# Patient Record
Sex: Male | Born: 1980
Health system: Southern US, Community
[De-identification: ages and names within clinical notes are randomized; demographics above are authoritative.]

## PROBLEM LIST (undated history)

## (undated) DIAGNOSIS — E785 Hyperlipidemia, unspecified: Secondary | ICD-10-CM

## (undated) DIAGNOSIS — I10 Essential (primary) hypertension: Secondary | ICD-10-CM

## (undated) DIAGNOSIS — T7840XA Allergy, unspecified, initial encounter: Secondary | ICD-10-CM

## (undated) DIAGNOSIS — G4730 Sleep apnea, unspecified: Secondary | ICD-10-CM

## (undated) DIAGNOSIS — R200 Anesthesia of skin: Secondary | ICD-10-CM

## (undated) DIAGNOSIS — K219 Gastro-esophageal reflux disease without esophagitis: Secondary | ICD-10-CM

## (undated) HISTORY — DX: Allergy, unspecified, initial encounter: T78.40XA

## (undated) HISTORY — DX: Morbid (severe) obesity due to excess calories: E66.01

## (undated) HISTORY — DX: Essential (primary) hypertension: I10

## (undated) HISTORY — DX: Gastro-esophageal reflux disease without esophagitis: K21.9

## (undated) HISTORY — DX: Anesthesia of skin: R20.0

## (undated) HISTORY — DX: Hyperlipidemia, unspecified: E78.5

---
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
## 2003-04-03 ENCOUNTER — Emergency Department (HOSPITAL_COMMUNITY): Admission: EM | Admit: 2003-04-03 | Discharge: 2003-04-03 | Payer: Self-pay | Admitting: Emergency Medicine

## 2005-10-03 ENCOUNTER — Encounter: Admission: RE | Admit: 2005-10-03 | Discharge: 2005-10-03 | Payer: Self-pay | Admitting: Occupational Medicine

## 2008-06-26 ENCOUNTER — Emergency Department (HOSPITAL_COMMUNITY): Admission: EM | Admit: 2008-06-26 | Discharge: 2008-06-26 | Payer: Self-pay | Admitting: Emergency Medicine

## 2008-07-01 ENCOUNTER — Encounter: Admission: RE | Admit: 2008-07-01 | Discharge: 2008-07-01 | Payer: Self-pay | Admitting: Family Medicine

## 2008-09-21 ENCOUNTER — Encounter: Admission: RE | Admit: 2008-09-21 | Discharge: 2008-09-21 | Payer: Self-pay | Admitting: Family Medicine

## 2008-12-15 ENCOUNTER — Encounter: Admission: RE | Admit: 2008-12-15 | Discharge: 2009-03-15 | Payer: Self-pay | Admitting: Family Medicine

## 2010-08-23 LAB — BASIC METABOLIC PANEL
BUN: 14 mg/dL (ref 6–23)
Calcium: 9.8 mg/dL (ref 8.4–10.5)
Chloride: 99 mEq/L (ref 96–112)
GFR calc Af Amer: >60 mL/min (ref >60)
Potassium: 4.7 mEq/L (ref 3.5–5.1)

## 2010-08-23 LAB — CBC
RBC: 6.24 MIL/uL — ABNORMAL HIGH (ref 4.22–5.81)
WBC: 6.4 10*3/uL (ref 4.0–10.5)

## 2010-08-23 LAB — URINE MICROSCOPIC-ADD ON

## 2010-08-23 LAB — URINALYSIS, ROUTINE W REFLEX MICROSCOPIC
Bilirubin Urine: NEGATIVE
Glucose, UA: >1000 mg/dL — AB
Leukocytes, UA: NEGATIVE
Protein, ur: 30 mg/dL — AB
Urobilinogen, UA: 0.2 mg/dL (ref 0.0–1.0)

## 2010-08-23 LAB — DIFFERENTIAL
Basophils Absolute: 0 10*3/uL (ref 0.0–0.1)
Lymphocytes Relative: 25 % (ref 12–46)
Monocytes Relative: 5 % (ref 3–12)
Neutro Abs: 4.3 10*3/uL (ref 1.7–7.7)

## 2010-08-23 LAB — GLUCOSE, CAPILLARY

## 2011-04-26 ENCOUNTER — Ambulatory Visit (INDEPENDENT_AMBULATORY_CARE_PROVIDER_SITE_OTHER): Payer: BC Managed Care – PPO | Admitting: Internal Medicine

## 2011-04-26 ENCOUNTER — Other Ambulatory Visit: Payer: Self-pay | Admitting: Internal Medicine

## 2011-04-26 ENCOUNTER — Encounter: Payer: Self-pay | Admitting: Internal Medicine

## 2011-04-26 ENCOUNTER — Other Ambulatory Visit (INDEPENDENT_AMBULATORY_CARE_PROVIDER_SITE_OTHER): Payer: BC Managed Care – PPO

## 2011-04-26 VITALS — BP 140/98 | HR 76 | Temp 98.2°F | Resp 16 | Ht 76.0 in | Wt 314.8 lb

## 2011-04-26 DIAGNOSIS — Z0000 Encounter for general adult medical examination without abnormal findings: Secondary | ICD-10-CM

## 2011-04-26 DIAGNOSIS — E782 Mixed hyperlipidemia: Secondary | ICD-10-CM

## 2011-04-26 DIAGNOSIS — IMO0001 Reserved for inherently not codable concepts without codable children: Secondary | ICD-10-CM

## 2011-04-26 DIAGNOSIS — I10 Essential (primary) hypertension: Secondary | ICD-10-CM

## 2011-04-26 DIAGNOSIS — N529 Male erectile dysfunction, unspecified: Secondary | ICD-10-CM

## 2011-04-26 DIAGNOSIS — I739 Peripheral vascular disease, unspecified: Secondary | ICD-10-CM

## 2011-04-26 DIAGNOSIS — K219 Gastro-esophageal reflux disease without esophagitis: Secondary | ICD-10-CM | POA: Insufficient documentation

## 2011-04-26 DIAGNOSIS — E781 Pure hyperglyceridemia: Secondary | ICD-10-CM | POA: Insufficient documentation

## 2011-04-26 LAB — CBC WITH DIFFERENTIAL/PLATELET
Basophils Relative: 0.4 % (ref 0.0–3.0)
Eosinophils Relative: 1.2 % (ref 0.0–5.0)
Lymphocytes Relative: 23.2 % (ref 12.0–46.0)
MCHC: 33.7 g/dL (ref 30.0–36.0)
Neutro Abs: 5.2 10*3/uL (ref 1.4–7.7)
Neutrophils Relative %: 71.2 % (ref 43.0–77.0)
Platelets: 233 10*3/uL (ref 150.0–400.0)
RBC: 6.3 Mil/uL — ABNORMAL HIGH (ref 4.22–5.81)
RDW: 13.9 % (ref 11.5–14.6)
WBC: 7.3 10*3/uL (ref 4.5–10.5)

## 2011-04-26 LAB — URINALYSIS, ROUTINE W REFLEX MICROSCOPIC
Ketones, ur: NEGATIVE
Leukocytes, UA: NEGATIVE
Nitrite: NEGATIVE
Total Protein, Urine: NEGATIVE
Urine Glucose: >=1000

## 2011-04-26 LAB — COMPREHENSIVE METABOLIC PANEL
Alkaline Phosphatase: 53 U/L (ref 39–117)
BUN: 14 mg/dL (ref 6–23)
CO2: 29 mEq/L (ref 19–32)
Calcium: 9.6 mg/dL (ref 8.4–10.5)
Creatinine, Ser: 1.2 mg/dL (ref 0.4–1.5)
GFR: 91.82 mL/min (ref >60.00)
Potassium: 4.3 mEq/L (ref 3.5–5.1)
Sodium: 138 mEq/L (ref 135–145)
Total Bilirubin: 0.7 mg/dL (ref 0.3–1.2)
Total Protein: 8 g/dL (ref 6.0–8.3)

## 2011-04-26 LAB — HM DIABETES FOOT EXAM

## 2011-04-26 LAB — LIPID PANEL: VLDL: 45.6 mg/dL — ABNORMAL HIGH (ref 0.0–40.0)

## 2011-04-26 MED ORDER — INSULIN LISPRO 100 UNIT/ML ~~LOC~~ SOLN: 25.0000 [IU] | Freq: Three times a day (TID) | SUBCUTANEOUS | Status: DC

## 2011-04-26 MED ORDER — INSULIN GLARGINE 100 UNIT/ML ~~LOC~~ SOLN: 60.0000 [IU] | Freq: Every day | SUBCUTANEOUS | Status: DC

## 2011-04-26 MED ORDER — RABEPRAZOLE SODIUM 20 MG PO TBEC: 20.0000 mg | DELAYED_RELEASE_TABLET | Freq: Every day | ORAL | Status: DC

## 2011-04-26 MED ORDER — OLMESARTAN-AMLODIPINE-HCTZ 40-5-12.5 MG PO TABS: 1.0000 | ORAL_TABLET | Freq: Every day | ORAL | Status: DC

## 2011-04-26 MED ORDER — SAXAGLIPTIN-METFORMIN ER 2.5-1000 MG PO TB24: 1.0000 | ORAL_TABLET | Freq: Every day | ORAL | Status: DC

## 2011-04-26 MED ORDER — VARDENAFIL HCL 10 MG PO TBDP: 1.0000 | ORAL_TABLET | ORAL | Status: DC | PRN

## 2011-04-26 NOTE — Assessment & Plan Note (Signed)
I have asked him to get ABI's done of his legs

## 2011-04-26 NOTE — Assessment & Plan Note (Signed)
His BP is not well controlled so I increased his meds to tribenzor

## 2011-04-26 NOTE — Assessment & Plan Note (Signed)
I will check his FLP today, he is doing well on crestor

## 2011-04-26 NOTE — Assessment & Plan Note (Signed)
Exam done, labs ordered, vaccines updated, pt ed material was given 

## 2011-04-26 NOTE — Assessment & Plan Note (Signed)
I will check labs to look for secondary causes and have given him staxyn to take

## 2011-04-26 NOTE — Progress Notes (Signed)
Subjective:    Patient ID: Mark Oconnor, male    DOB: 01-Jan-1981, 30 y.o.   MRN: [id number]  Diabetes He presents for his follow-up diabetic visit. He has type 2 diabetes mellitus. No MedicAlert identification noted. The initial diagnosis of diabetes was made 2 years ago. His disease course has been worsening. There are no hypoglycemic associated symptoms. Pertinent negatives for hypoglycemia include no dizziness, headaches, pallor, seizures, speech difficulty or tremors. Associated symptoms include polydipsia, polyphagia and polyuria. Pertinent negatives for diabetes include no blurred vision, no chest pain, no fatigue, no foot paresthesias, no foot ulcerations, no visual change, no weakness and no weight loss. There are no hypoglycemic complications. Symptoms are worsening. Diabetic complications include impotence and PVD. Current diabetic treatment includes intensive insulin program and oral agent (monotherapy). He is compliant with treatment all of the time. His weight is increasing steadily. He is following a generally healthy diet. Meal planning includes avoidance of concentrated sweets. He has not had a previous visit with a dietician. He never participates in exercise. His home blood glucose trend is increasing steadily. His breakfast blood glucose range is generally 130-140 mg/dl. His lunch blood glucose range is generally 180-200 mg/dl. His dinner blood glucose range is generally >200 mg/dl. His highest blood glucose is >200 mg/dl. His overall blood glucose range is >200 mg/dl. An ACE inhibitor/angiotensin II receptor blocker is being taken. He does not see a podiatrist.Eye exam is current.      Review of Systems  Constitutional: Positive for unexpected weight change (weight gain). Negative for fever, chills, weight loss, diaphoresis, activity change, appetite change and fatigue.  HENT: Negative for sore throat, trouble swallowing and voice change.   Eyes: Negative.  Negative for blurred  vision.  Respiratory: Negative for apnea, cough, choking, chest tightness, shortness of breath, wheezing and stridor.   Cardiovascular: Negative for chest pain, palpitations and leg swelling.  Gastrointestinal: Negative for nausea, vomiting, abdominal pain, diarrhea, blood in stool, abdominal distention and anal bleeding.  Genitourinary: Positive for polyuria, frequency and impotence. Negative for dysuria, urgency, hematuria, flank pain, decreased urine volume, discharge, penile swelling, scrotal swelling, enuresis, difficulty urinating, genital sores, penile pain and testicular pain.  Musculoskeletal: Positive for myalgias (cramps in feet and calves). Negative for back pain, joint swelling, arthralgias and gait problem.  Skin: Negative for color change, pallor, rash and wound.  Neurological: Negative for dizziness, tremors, seizures, syncope, facial asymmetry, speech difficulty, weakness, light-headedness, numbness and headaches.  Hematological: Positive for polydipsia and polyphagia. Negative for adenopathy. Does not bruise/bleed easily.  Psychiatric/Behavioral: Negative.        Objective:   Physical Exam  Vitals reviewed. Constitutional: He is oriented to person, place, and time. He appears well-developed and well-nourished. No distress.  HENT:  Head: Normocephalic and atraumatic.  Mouth/Throat: Oropharynx is clear and moist. No oropharyngeal exudate.  Eyes: Conjunctivae are normal. Right eye exhibits no discharge. Left eye exhibits no discharge. No scleral icterus.  Neck: Normal range of motion. Neck supple. No JVD present. No tracheal deviation present. No thyromegaly present.  Cardiovascular: Normal rate, regular rhythm, normal heart sounds and intact distal pulses.  Exam reveals no gallop and no friction rub.   No murmur heard. Pulmonary/Chest: Effort normal and breath sounds normal. No stridor. No respiratory distress. He has no wheezes. He has no rales. He exhibits no tenderness.    Abdominal: Soft. Bowel sounds are normal. He exhibits no distension and no mass. There is no tenderness. There is no rebound and no guarding.  Hernia confirmed negative in the right inguinal area and confirmed negative in the left inguinal area.  Genitourinary: Rectum normal, prostate normal, testes normal and penis normal. Rectal exam shows no external hemorrhoid, no internal hemorrhoid, no fissure, no mass and no tenderness. Guaiac negative stool. Prostate is not enlarged and not tender. Right testis shows no mass, no swelling and no tenderness. Right testis is descended. Left testis shows no mass, no swelling and no tenderness. Left testis is descended. Circumcised. No penile tenderness. No discharge found.  Musculoskeletal: Normal range of motion. He exhibits no edema and no tenderness.  Lymphadenopathy:    He has no cervical adenopathy.       Right: No inguinal adenopathy present.       Left: No inguinal adenopathy present.  Neurological: He is alert and oriented to person, place, and time. He has normal reflexes. He displays normal reflexes. No cranial nerve deficit. He exhibits normal muscle tone. Coordination normal.  Skin: Skin is warm and dry. No rash noted. He is not diaphoretic. No erythema. No pallor.  Psychiatric: He has a normal mood and affect. His behavior is normal. Judgment and thought content normal.      Lab Results  Component Value Date   WBC 6.4 06/26/2008   HGB 16.5 06/26/2008   HCT 47.3 06/26/2008   PLT 204 06/26/2008   GLUCOSE 392* 06/26/2008   NA 133* 06/26/2008   K 4.7 HEMOLYZED SPECIMEN, RESULTS MAY BE AFFECTED 06/26/2008   CL 99 06/26/2008   CREATININE 1.41 06/26/2008   BUN 14 06/26/2008   CO2 20 06/26/2008      Assessment & Plan:

## 2011-04-26 NOTE — Assessment & Plan Note (Signed)
Start aciphex

## 2011-04-26 NOTE — Patient Instructions (Signed)
Hypertension As your heart beats, it forces blood through your arteries. This force is your blood pressure. If the pressure is too high, it is called hypertension (HTN) or high blood pressure. HTN is dangerous because you may have it and not know it. High blood pressure may mean that your heart has to work harder to pump blood. Your arteries may be narrow or stiff. The extra work puts you at risk for heart disease, stroke, and other problems.  Blood pressure consists of two numbers, a higher number over a lower, 110/72, for example. It is stated as "110 over 72." The ideal is below 120 for the top number (systolic) and under 80 for the bottom (diastolic). Write down your blood pressure today. You should pay close attention to your blood pressure if you have certain conditions such as:  Heart failure.   Prior heart attack.   Diabetes   Chronic kidney disease.   Prior stroke.   Multiple risk factors for heart disease.  To see if you have HTN, your blood pressure should be measured while you are seated with your arm held at the level of the heart. It should be measured at least twice. A one-time elevated blood pressure reading (especially in the Emergency Department) does not mean that you need treatment. There may be conditions in which the blood pressure is different between your right and left arms. It is important to see your caregiver soon for a recheck. Most people have essential hypertension which means that there is not a specific cause. This type of high blood pressure may be lowered by changing lifestyle factors such as:  Stress.   Smoking.   Lack of exercise.   Excessive weight.   Drug/tobacco/alcohol use.   Eating less salt.  Most people do not have symptoms from high blood pressure until it has caused damage to the body. Effective treatment can often prevent, delay or reduce that damage. TREATMENT  When a cause has been identified, treatment for high blood pressure is  directed at the cause. There are a large number of medications to treat HTN. These fall into several categories, and your caregiver will help you select the medicines that are best for you. Medications may have side effects. You should review side effects with your caregiver. If your blood pressure stays high after you have made lifestyle changes or started on medicines,   Your medication(s) may need to be changed.   Other problems may need to be addressed.   Be certain you understand your prescriptions, and know how and when to take your medicine.   Be sure to follow up with your caregiver within the time frame advised (usually within two weeks) to have your blood pressure rechecked and to review your medications.   If you are taking more than one medicine to lower your blood pressure, make sure you know how and at what times they should be taken. Taking two medicines at the same time can result in blood pressure that is too low.  SEEK IMMEDIATE MEDICAL CARE IF:  You develop a severe headache, blurred or changing vision, or confusion.   You have unusual weakness or numbness, or a faint feeling.   You have severe chest or abdominal pain, vomiting, or breathing problems.  MAKE SURE YOU:   Understand these instructions.   Will watch your condition.   Will get help right away if you are not doing well or get worse.  Document Released: 04/24/2005 Document Revised: 01/04/2011 Document Reviewed:   12/13/2007 Exit[employer] Patient Information 2012 [employer].Diabetes, Type 2 Diabetes is a long-lasting (chronic) disease. In type 2 diabetes, the pancreas does not make enough insulin (a hormone), and the body does not respond normally to the insulin that is made. This type of diabetes was also previously called adult-onset diabetes. It usually occurs after the age of 40, but it can occur at any age.  CAUSES  Type 2 diabetes happens because the pancreasis not making enough insulin or your body has  trouble using the insulin that your pancreas does make properly. SYMPTOMS   Drinking more than usual.   Urinating more than usual.   Blurred vision.   Dry, itchy skin.   Frequent infections.   Feeling more tired than usual (fatigue).  DIAGNOSIS The diagnosis of type 2 diabetes is usually made by one of the following tests:  Fasting blood glucose test. You will not eat for at least 8 hours and then take a blood test.   Random blood glucose test. Your blood glucose (sugar) is checked at any time of the day regardless of when you ate.   Oral glucose tolerance test (OGTT). Your blood glucose is measured after you have not eaten (fasted) and then after you drink a glucose containing beverage.  TREATMENT   Healthy eating.   Exercise.   Medicine, if needed.   Monitoring blood glucose.   Seeing your caregiver regularly.  HOME CARE INSTRUCTIONS   Check your blood glucose at least once a day. More frequent monitoring may be necessary, depending on your medicines and on how well your diabetes is controlled. Your caregiver will advise you.   Take your medicine as directed by your caregiver.   Do not smoke.   Make wise food choices. Ask your caregiver for information. Weight loss can improve your diabetes.   Learn about low blood glucose (hypoglycemia) and how to treat it.   Get your eyes checked regularly.   Have a yearly physical exam. Have your blood pressure checked and your blood and urine tested.   Wear a pendant or bracelet saying that you have diabetes.   Check your feet every night for cuts, sores, blisters, and redness. Let your caregiver know if you have any problems.  SEEK MEDICAL CARE IF:   You have problems keeping your blood glucose in target range.   You have problems with your medicines.   You have symptoms of an illness that do not improve after 24 hours.   You have a sore or wound that is not healing.   You notice a change in vision or a new problem  with your vision.   You have a fever.  MAKE SURE YOU:  Understand these instructions.   Will watch your condition.   Will get help right away if you are not doing well or get worse.  Document Released: 04/24/2005 Document Revised: 01/05/2011 Document Reviewed: 10/10/2010 [employer] Patient Information 2012 [employer]. 

## 2011-04-26 NOTE — Assessment & Plan Note (Addendum)
His blood sugar is not well controlled, I have increase his meds to [id number] and have asked him to increase his insulin doses  as well, I will check his a1c and his c-peptide level today

## 2011-04-27 ENCOUNTER — Encounter: Payer: Self-pay | Admitting: Internal Medicine

## 2011-04-27 LAB — TESTOSTERONE, FREE, TOTAL, SHBG: Testosterone, Free: 85.2 pg/mL (ref 47.0–244.0)

## 2011-05-02 ENCOUNTER — Encounter (HOSPITAL_COMMUNITY): Payer: Self-pay | Admitting: *Deleted

## 2011-05-02 ENCOUNTER — Emergency Department (HOSPITAL_COMMUNITY)
Admission: EM | Admit: 2011-05-02 | Discharge: 2011-05-02 | Disposition: A | Discharge status: Home / Self Care | Payer: BC Managed Care – PPO | Attending: Emergency Medicine | Admitting: Emergency Medicine

## 2011-05-02 DIAGNOSIS — R6883 Chills (without fever): Secondary | ICD-10-CM | POA: Insufficient documentation

## 2011-05-02 DIAGNOSIS — R197 Diarrhea, unspecified: Secondary | ICD-10-CM | POA: Insufficient documentation

## 2011-05-02 DIAGNOSIS — R5381 Other malaise: Secondary | ICD-10-CM | POA: Insufficient documentation

## 2011-05-02 DIAGNOSIS — I10 Essential (primary) hypertension: Secondary | ICD-10-CM | POA: Insufficient documentation

## 2011-05-02 DIAGNOSIS — F17200 Nicotine dependence, unspecified, uncomplicated: Secondary | ICD-10-CM | POA: Insufficient documentation

## 2011-05-02 DIAGNOSIS — R5383 Other fatigue: Secondary | ICD-10-CM | POA: Insufficient documentation

## 2011-05-02 DIAGNOSIS — E119 Type 2 diabetes mellitus without complications: Secondary | ICD-10-CM | POA: Insufficient documentation

## 2011-05-02 DIAGNOSIS — Z794 Long term (current) use of insulin: Secondary | ICD-10-CM | POA: Insufficient documentation

## 2011-05-02 DIAGNOSIS — R1110 Vomiting, unspecified: Secondary | ICD-10-CM | POA: Insufficient documentation

## 2011-05-02 LAB — POCT I-STAT, CHEM 8
BUN: 19 mg/dL (ref 6–23)
Chloride: 105 mEq/L (ref 96–112)
Potassium: 3.8 mEq/L (ref 3.5–5.1)
Sodium: 140 mEq/L (ref 135–145)
TCO2: 24 mmol/L (ref 0–100)

## 2011-05-02 MED ORDER — LOPERAMIDE HCL 2 MG PO CAPS: 2.0000 mg | ORAL_CAPSULE | Freq: Four times a day (QID) | ORAL | Status: AC | PRN

## 2011-05-02 MED ORDER — DICYCLOMINE HCL 10 MG PO CAPS
20.0000 mg | ORAL_CAPSULE | ORAL | Status: DC
  Filled 2011-05-02: qty 2

## 2011-05-02 MED ORDER — DICYCLOMINE HCL 20 MG PO TABS
ORAL_TABLET | ORAL | Status: AC
  Administered 2011-05-02: 11:00:00
  Filled 2011-05-02: qty 1

## 2011-05-02 MED ORDER — METOCLOPRAMIDE HCL 5 MG/ML IJ SOLN
10.0000 mg | Freq: Once | INTRAMUSCULAR | Status: AC
  Administered 2011-05-02: 10 mg via INTRAVENOUS
  Filled 2011-05-02: qty 2

## 2011-05-02 MED ORDER — ONDANSETRON HCL 4 MG/2ML IJ SOLN
4.0000 mg | Freq: Once | INTRAMUSCULAR | Status: AC
  Administered 2011-05-02: 4 mg via INTRAVENOUS
  Filled 2011-05-02: qty 2

## 2011-05-02 MED ORDER — DICYCLOMINE HCL 20 MG PO TABS: 20.0000 mg | ORAL_TABLET | Freq: Two times a day (BID) | ORAL | Status: DC

## 2011-05-02 MED ORDER — METOCLOPRAMIDE HCL 10 MG PO TABS: 10.0000 mg | ORAL_TABLET | Freq: Four times a day (QID) | ORAL | Status: AC

## 2011-05-02 MED ORDER — SODIUM CHLORIDE 0.9 % IV BOLUS (SEPSIS)
2000.0000 mL | Freq: Once | INTRAVENOUS | Status: AC
  Administered 2011-05-02: 2000 mL via INTRAVENOUS

## 2011-05-02 NOTE — ED Notes (Signed)
Thurston Hole, NP at bedside in triage assessing pt.

## 2011-05-02 NOTE — ED Notes (Signed)
Pt reports vomiting/diarrhea x2 days. Able to tolerate liquids, has not eaten at home. Denies abd pain, blood in stool or emesis.

## 2011-05-02 NOTE — ED Provider Notes (Signed)
History     CSN: [id number]  Arrival date & time 05/02/11  4098   First MD Initiated Contact with Patient 05/02/11 [phone]      Chief Complaint  Patient presents with  . Emesis  . Diarrhea    (Consider location/radiation/quality/duration/timing/severity/associated sxs/prior treatment) Patient is a 30 y.o. male presenting with vomiting and diarrhea. The history is provided by the patient. No language interpreter was used.  Emesis  This is a new problem. The current episode started 2 days ago. The problem occurs 2 to 4 times per day. The problem has been gradually worsening. The emesis has an appearance of stomach contents. Associated symptoms include chills and diarrhea. Pertinent negatives include no abdominal pain, no arthralgias, no cough, no fever, no headaches, no myalgias and no URI.  Diarrhea The primary symptoms include fatigue, vomiting and diarrhea. Primary symptoms do not include fever, abdominal pain, myalgias or arthralgias.  The illness is also significant for chills.   Patient presents to the ER today with a two-day history of nausea and vomiting. He said he has had 5 episodes of diarrhea in the last 12 hours and 5 episodes of vomiting in the last 2 days. He has tried an acid reducer but got no relief. Denies upper respiratory symptoms, fever or body aches and pains. Patient is a smoker. Last blood sugar was 180 last night and he took his insulin after that. He has not had anything to eat or drink since.  Past Medical History  Diagnosis Date  . Diabetes mellitus   . Hyperlipidemia   . Hypertension     History reviewed. No pertinent past surgical history.  No family history on file.  History  Substance Use Topics  . Smoking status: Current Everyday Smoker -- 0.5 packs/day for 15 years    Types: Cigarettes  . Smokeless tobacco: Never Used  . Alcohol Use: No      Review of Systems  Constitutional: Positive for chills and fatigue. Negative for fever.  HENT:  Negative for ear pain, congestion, rhinorrhea, sneezing and postnasal drip.   Respiratory: Negative for apnea, cough and chest tightness.   Cardiovascular: Negative for chest pain.  Gastrointestinal: Positive for vomiting and diarrhea. Negative for abdominal pain.  Musculoskeletal: Negative for myalgias and arthralgias.  Neurological: Negative for dizziness, weakness, light-headedness and headaches.  Psychiatric/Behavioral: Negative.   All other systems reviewed and are negative.    Allergies  Review of patient's allergies indicates no known allergies.  Home Medications   Current Outpatient Rx  Name Route Sig Dispense Refill  . INSULIN GLARGINE 100 UNIT/ML [place] SOLN Subcutaneous Inject 60 Units into the skin at bedtime. 3 mL 11  . INSULIN LISPRO (HUMAN) 100 UNIT/ML [place] SOLN Subcutaneous Inject 25 Units into the skin 3 (three) times daily before meals. 3 mL 11  . METFORMIN HCL ER 750 MG PO TB24 Oral Take 750 mg by mouth 2 (two) times daily.      Marland Kitchen OLMESARTAN-AMLODIPINE-HCTZ 40-5-12.5 MG PO TABS Oral Take 1 tablet by mouth daily. 28 tablet 0  . RABEPRAZOLE SODIUM 20 MG PO TBEC Oral Take 1 tablet (20 mg total) by mouth daily. 15 tablet 0  . ROSUVASTATIN CALCIUM 20 MG PO TABS Oral Take 20 mg by mouth daily.      Marland Kitchen SAXAGLIPTIN-METFORMIN 2.09-998 MG PO TB24 Oral Take 1 tablet by mouth daily. 120 tablet 0  . VARDENAFIL HCL 10 MG PO TBDP Oral Take 1 tablet by mouth as needed. 16 tablet 0  BP 121/73  Pulse 92  Temp(Src) 99.9 F (37.7 C) (Oral)  Resp 14  SpO2 97%  Physical Exam  Nursing note and vitals reviewed. Constitutional: He is oriented to person, place, and time. He appears well-developed and well-nourished.  HENT:  Head: Normocephalic.  Eyes: Pupils are equal, round, and reactive to light.  Neck: Normal range of motion.  Cardiovascular: Normal rate.  Exam reveals no gallop and no friction rub.   No murmur heard. Pulmonary/Chest: Effort normal and breath sounds normal. No  respiratory distress. He has no wheezes. He has no rales. He exhibits no tenderness.  Abdominal: Soft. He exhibits no distension. There is no tenderness. There is no rebound and no guarding.  Musculoskeletal: Normal range of motion. He exhibits no edema and no tenderness.  Neurological: He is alert and oriented to person, place, and time.  Skin: Skin is warm and dry.  Psychiatric: He has a normal mood and affect.    ED Course  Procedures (including critical care time)   Labs Reviewed  I-STAT, CHEM 8   No results found.   No diagnosis found.    MDM  The patient feels better now after Zofran Reglan and 2 L of fluid in the ER today. He will leave with a prescription for Reglan and Imodium and Bentyl.  No further vomiting or diarrhea in the ER today patient will followup with Dr. Yetta Barre this week if not better.        Jethro Bastos, NP 05/04/11 1729

## 2011-05-03 ENCOUNTER — Ambulatory Visit: Payer: BC Managed Care – PPO | Admitting: Internal Medicine

## 2011-05-04 NOTE — ED Provider Notes (Signed)
Medical screening examination/treatment/procedure(s) were performed by non-physician practitioner and as supervising physician I was immediately available for consultation/collaboration.   Tlaloc Taddei L Latanya Hemmer, MD 05/04/11 2314 

## 2011-05-15 ENCOUNTER — Encounter: Payer: Self-pay | Admitting: Internal Medicine

## 2011-05-15 ENCOUNTER — Ambulatory Visit (INDEPENDENT_AMBULATORY_CARE_PROVIDER_SITE_OTHER): Payer: BC Managed Care – PPO | Admitting: Internal Medicine

## 2011-05-15 VITALS — BP 122/86 | HR 87 | Temp 97.8°F | Resp 16 | Wt 318.0 lb

## 2011-05-15 DIAGNOSIS — E782 Mixed hyperlipidemia: Secondary | ICD-10-CM

## 2011-05-15 DIAGNOSIS — IMO0001 Reserved for inherently not codable concepts without codable children: Secondary | ICD-10-CM

## 2011-05-15 DIAGNOSIS — I10 Essential (primary) hypertension: Secondary | ICD-10-CM

## 2011-05-15 NOTE — Assessment & Plan Note (Signed)
His BP is well controlled 

## 2011-05-15 NOTE — Assessment & Plan Note (Signed)
His blood sugars are improving on the current meds and lifestyle modifications, he will see diabetic education as well

## 2011-05-15 NOTE — Progress Notes (Signed)
Subjective:    Patient ID: Mark Oconnor, male    DOB: 26-Jul-1980, 31 y.o.   MRN: [id number]  Diabetes He presents for his follow-up diabetic visit. He has type 2 diabetes mellitus. His disease course has been improving. There are no hypoglycemic associated symptoms. Pertinent negatives for hypoglycemia include no pallor. Pertinent negatives for diabetes include no blurred vision, no chest pain, no fatigue, no foot paresthesias, no foot ulcerations, no polydipsia, no polyphagia, no polyuria, no visual change, no weakness and no weight loss. There are no hypoglycemic complications. Symptoms are stable. There are no diabetic complications. Current diabetic treatment includes oral agent (dual therapy) and intensive insulin program. He is compliant with treatment all of the time. His weight is stable. He is following a generally healthy diet. Meal planning includes avoidance of concentrated sweets. He participates in exercise intermittently. His home blood glucose trend is decreasing steadily. His breakfast blood glucose range is generally 110-130 mg/dl. His lunch blood glucose range is generally 130-140 mg/dl. His dinner blood glucose range is generally 130-140 mg/dl. His highest blood glucose is 180-200 mg/dl. His overall blood glucose range is 130-140 mg/dl. An ACE inhibitor/angiotensin II receptor blocker is being taken. He does not see a podiatrist.Eye exam is current.      Review of Systems  Constitutional: Negative for fever, chills, weight loss, diaphoresis, activity change, appetite change, fatigue and unexpected weight change.  HENT: Negative.   Eyes: Negative.  Negative for blurred vision.  Respiratory: Negative for cough, choking, shortness of breath, wheezing and stridor.   Cardiovascular: Negative for chest pain, palpitations and leg swelling.  Gastrointestinal: Negative for nausea, vomiting, abdominal pain, diarrhea and constipation.  Genitourinary: Negative for dysuria, urgency,  polyuria, frequency, hematuria, flank pain, decreased urine volume, enuresis and difficulty urinating.  Musculoskeletal: Negative for myalgias, back pain, joint swelling, arthralgias and gait problem.  Skin: Negative for color change, pallor, rash and wound.  Neurological: Negative.  Negative for weakness.  Hematological: Negative for polydipsia, polyphagia and adenopathy. Does not bruise/bleed easily.  Psychiatric/Behavioral: Negative.        Objective:   Physical Exam  Vitals reviewed. Constitutional: He is oriented to person, place, and time. He appears well-developed and well-nourished. No distress.  HENT:  Head: Normocephalic and atraumatic.  Mouth/Throat: Oropharynx is clear and moist. No oropharyngeal exudate.  Eyes: Conjunctivae are normal. Right eye exhibits no discharge. Left eye exhibits no discharge. No scleral icterus.  Neck: Normal range of motion. Neck supple. No JVD present. No tracheal deviation present. No thyromegaly present.  Cardiovascular: Normal rate, regular rhythm, normal heart sounds and intact distal pulses.   Pulses:      Carotid pulses are 1+ on the right side, and 1+ on the left side.      Radial pulses are 1+ on the right side, and 1+ on the left side.       Femoral pulses are 1+ on the right side, and 1+ on the left side.      Popliteal pulses are 1+ on the right side, and 1+ on the left side.       Dorsalis pedis pulses are 1+ on the right side, and 1+ on the left side.       Posterior tibial pulses are 1+ on the right side, and 1+ on the left side.  Pulmonary/Chest: Effort normal and breath sounds normal. No stridor. No respiratory distress. He has no wheezes. He has no rales. He exhibits no tenderness.  Abdominal: Soft. Bowel sounds are  normal. He exhibits no distension and no mass. There is no tenderness. There is no rebound and no guarding.  Musculoskeletal: Normal range of motion. He exhibits no edema and no tenderness.  Lymphadenopathy:    He has  no cervical adenopathy.  Neurological: He is oriented to person, place, and time.  Skin: Skin is warm and dry. No rash noted. He is not diaphoretic. No erythema. No pallor.  Psychiatric: He has a normal mood and affect. His behavior is normal. Judgment and thought content normal.      Lab Results  Component Value Date   WBC 7.3 04/26/2011   HGB 19.4* 05/02/2011   HCT 57.0* 05/02/2011   PLT 233.0 04/26/2011   GLUCOSE 190* 05/02/2011   CHOL 224* 04/26/2011   TRIG 228.0* 04/26/2011   HDL 41.80 04/26/2011   LDLDIRECT 140.2 04/26/2011   ALT 18 04/26/2011   AST 16 04/26/2011   NA 140 05/02/2011   K 3.8 05/02/2011   CL 105 05/02/2011   CREATININE 1.10 05/02/2011   BUN 19 05/02/2011   CO2 29 04/26/2011   TSH 0.48 04/26/2011   PSA 0.53 04/26/2011   HGBA1C 12.8* 04/26/2011      Assessment & Plan:

## 2011-05-15 NOTE — Patient Instructions (Signed)

## 2011-05-15 NOTE — Assessment & Plan Note (Signed)
He is doing well on crestor 

## 2011-05-22 ENCOUNTER — Encounter (INDEPENDENT_AMBULATORY_CARE_PROVIDER_SITE_OTHER): Payer: BC Managed Care – PPO | Admitting: *Deleted

## 2011-05-22 DIAGNOSIS — I739 Peripheral vascular disease, unspecified: Secondary | ICD-10-CM

## 2011-05-22 DIAGNOSIS — E1159 Type 2 diabetes mellitus with other circulatory complications: Secondary | ICD-10-CM

## 2011-07-09 ENCOUNTER — Emergency Department (INDEPENDENT_AMBULATORY_CARE_PROVIDER_SITE_OTHER)
Admission: EM | Admit: 2011-07-09 | Discharge: 2011-07-09 | Disposition: A | Discharge status: Home / Self Care | Payer: BC Managed Care – PPO | Source: Home / Self Care | Attending: Family Medicine | Admitting: Family Medicine

## 2011-07-09 ENCOUNTER — Encounter (HOSPITAL_COMMUNITY): Payer: Self-pay

## 2011-07-09 DIAGNOSIS — K130 Diseases of lips: Secondary | ICD-10-CM

## 2011-07-09 MED ORDER — IBUPROFEN 800 MG PO TABS
ORAL_TABLET | ORAL | Status: AC
  Filled 2011-07-09: qty 1

## 2011-07-09 MED ORDER — MUPIROCIN CALCIUM 2 % EX CREA: TOPICAL_CREAM | Freq: Three times a day (TID) | CUTANEOUS | Status: AC

## 2011-07-09 MED ORDER — IBUPROFEN 800 MG PO TABS
800.0000 mg | ORAL_TABLET | Freq: Once | ORAL | Status: AC
  Administered 2011-07-09: 800 mg via ORAL

## 2011-07-09 MED ORDER — DOXYCYCLINE HYCLATE 100 MG PO CAPS: 100.0000 mg | ORAL_CAPSULE | Freq: Two times a day (BID) | ORAL | Status: AC

## 2011-07-09 NOTE — Discharge Instructions (Signed)
Warm compress twice a day when you take the antibiotic, take all of medicine, return as needed. °

## 2011-07-09 NOTE — ED Notes (Signed)
Pt was seen at another facility on Friday and given amoxicillin for a boil on lt side of upper lip.  Swollen and painful and states he has had numerous abscesses in the past.

## 2011-07-09 NOTE — ED Provider Notes (Signed)
History     CSN: [id number]  Arrival date & time 07/09/11  0904   First MD Initiated Contact with Patient 07/09/11 [phone]      Chief Complaint  Patient presents with  . Recurrent Skin Infections    (Consider location/radiation/quality/duration/timing/severity/associated sxs/prior treatment) Patient is a 31 y.o. male presenting with abscess. The history is provided by the patient.  Abscess  This is a new problem. The current episode started less than one week ago. The problem has been gradually worsening. The abscess is present on the lips. The problem is moderate. The abscess is characterized by painfulness, draining and swelling. Past medical history comments: h/o mult abscesses on body.. Recently, medical care has been given at another facility.    Past Medical History  Diagnosis Date  . Diabetes mellitus   . Hyperlipidemia   . Hypertension     History reviewed. No pertinent past surgical history.  Family History  Problem Relation Age of Onset  . Diabetes Father   . Cancer Neg Hx   . Heart disease Neg Hx   . Hyperlipidemia Neg Hx   . Hypertension Neg Hx     History  Substance Use Topics  . Smoking status: Current Everyday Smoker -- 0.5 packs/day for 15 years    Types: Cigarettes  . Smokeless tobacco: Never Used  . Alcohol Use: Yes     occasional      Review of Systems  Constitutional: Negative.   Skin: Positive for rash.    Allergies  Review of patient's allergies indicates no known allergies.  Home Medications   Current Outpatient Rx  Name Route Sig Dispense Refill  . AMOXICILLIN 500 MG PO CAPS Oral Take 500 mg by mouth 3 (three) times daily.    Marland Kitchen DICYCLOMINE HCL 20 MG PO TABS Oral Take 1 tablet (20 mg total) by mouth 2 (two) times daily. 20 tablet 0  . DOXYCYCLINE HYCLATE 100 MG PO CAPS Oral Take 1 capsule (100 mg total) by mouth 2 (two) times daily. 20 capsule 0  . INSULIN GLARGINE 100 UNIT/ML [place] SOLN Subcutaneous Inject 60 Units into the skin at  bedtime. 3 mL 11  . INSULIN LISPRO (HUMAN) 100 UNIT/ML [place] SOLN Subcutaneous Inject 25 Units into the skin 3 (three) times daily before meals. 3 mL 11  . MUPIROCIN CALCIUM 2 % EX CREA Topical Apply topically 3 (three) times daily. 15 g 0  . OLMESARTAN-AMLODIPINE-HCTZ 40-5-12.5 MG PO TABS Oral Take 1 tablet by mouth daily. 28 tablet 0  . RABEPRAZOLE SODIUM 20 MG PO TBEC Oral Take 1 tablet (20 mg total) by mouth daily. 15 tablet 0  . ROSUVASTATIN CALCIUM 20 MG PO TABS Oral Take 20 mg by mouth daily.      Marland Kitchen SAXAGLIPTIN-METFORMIN ER 2.09-998 MG PO TB24 Oral Take 1 tablet by mouth daily. 120 tablet 0  . VARDENAFIL HCL 10 MG PO TBDP Oral Take 1 tablet by mouth as needed. 16 tablet 0    BP 130/76  Pulse 76  Temp(Src) 98.4 F (36.9 C) (Oral)  Resp 17  SpO2 98%  Physical Exam  Nursing note and vitals reviewed. Constitutional: He appears well-developed and well-nourished.  HENT:  Head: Normocephalic.  Right Ear: External ear normal.  Left Ear: External ear normal.  Mouth/Throat: Oropharynx is clear and moist.  Skin: Skin is warm and dry. Rash noted.       Crusting pustular sts to left upper lip.    ED Course  INCISION AND DRAINAGE Date/Time: 07/09/2011  9:31 AM Performed by: Barkley Bruns Authorized by: Barkley Bruns Consent: Verbal consent obtained. Consent given by: patient Type: abscess Body area: mouth (left side of upper lip externally.) Patient sedated: no Scalpel size: 15 Incision type: crsut debrided and purulent fluid expressed, copious. Complexity: simple Drainage: purulent Drainage amount: moderate Wound treatment: wound left open Patient tolerance: Patient tolerated the procedure well with no immediate complications.   (including critical care time)   Labs Reviewed  WOUND CULTURE   No results found.   1. Lip abscess       MDM          Barkley Bruns, MD 07/09/11 [phone]

## 2011-07-11 LAB — WOUND CULTURE

## 2011-07-12 ENCOUNTER — Telehealth (HOSPITAL_COMMUNITY): Payer: Self-pay | Admitting: *Deleted

## 2011-07-12 NOTE — ED Notes (Signed)
Wound culture L lip: Mod. MRSA. Pt. adequately treated with Doxycycline. I called and left message for pt. to call. Vassie Moselle 07/12/2011

## 2011-07-12 NOTE — ED Notes (Addendum)
Pt. called back and verified. Pt. given results and told he was adeq. treated with Doxycycline. Pt. denies hx. of MRSA. Instructed to finish all of antibiotic. MRSA instructions given. Pt. voiced understanding. Mark Oconnor 07/12/2011

## 2011-07-17 ENCOUNTER — Encounter: Payer: BC Managed Care – PPO | Admitting: *Deleted

## 2011-07-19 ENCOUNTER — Ambulatory Visit: Payer: BC Managed Care – PPO | Admitting: Internal Medicine

## 2011-07-26 ENCOUNTER — Encounter: Payer: Self-pay | Admitting: Internal Medicine

## 2011-07-26 ENCOUNTER — Ambulatory Visit (INDEPENDENT_AMBULATORY_CARE_PROVIDER_SITE_OTHER): Payer: BC Managed Care – PPO | Admitting: Internal Medicine

## 2011-07-26 ENCOUNTER — Other Ambulatory Visit (INDEPENDENT_AMBULATORY_CARE_PROVIDER_SITE_OTHER): Payer: BC Managed Care – PPO

## 2011-07-26 VITALS — BP 128/88 | HR 72 | Temp 98.3°F | Resp 16 | Wt 321.0 lb

## 2011-07-26 DIAGNOSIS — E782 Mixed hyperlipidemia: Secondary | ICD-10-CM

## 2011-07-26 DIAGNOSIS — IMO0001 Reserved for inherently not codable concepts without codable children: Secondary | ICD-10-CM

## 2011-07-26 DIAGNOSIS — L309 Dermatitis, unspecified: Secondary | ICD-10-CM

## 2011-07-26 DIAGNOSIS — L259 Unspecified contact dermatitis, unspecified cause: Secondary | ICD-10-CM

## 2011-07-26 DIAGNOSIS — I10 Essential (primary) hypertension: Secondary | ICD-10-CM

## 2011-07-26 LAB — LIPID PANEL
Cholesterol: 183 mg/dL (ref 0–200)
HDL: 43.4 mg/dL (ref >39.00)
LDL Cholesterol: 107 mg/dL — ABNORMAL HIGH (ref 0–99)
Triglycerides: 162 mg/dL — ABNORMAL HIGH (ref 0.0–149.0)
VLDL: 32.4 mg/dL (ref 0.0–40.0)

## 2011-07-26 LAB — URINALYSIS, ROUTINE W REFLEX MICROSCOPIC
Bilirubin Urine: NEGATIVE
Ketones, ur: NEGATIVE
Specific Gravity, Urine: 1.02 (ref 1.000–1.030)
Total Protein, Urine: NEGATIVE
Urine Glucose: >=1000
pH: 6 (ref 5.0–8.0)

## 2011-07-26 LAB — TSH: TSH: 0.57 u[IU]/mL (ref 0.35–5.50)

## 2011-07-26 LAB — CBC WITH DIFFERENTIAL/PLATELET
Basophils Relative: 1 % (ref 0.0–3.0)
Eosinophils Absolute: 0.1 10*3/uL (ref 0.0–0.7)
Eosinophils Relative: 1.4 % (ref 0.0–5.0)
Lymphocytes Relative: 30.7 % (ref 12.0–46.0)
MCHC: 32.8 g/dL (ref 30.0–36.0)
Neutrophils Relative %: 62.8 % (ref 43.0–77.0)
Platelets: 234 10*3/uL (ref 150.0–400.0)
RBC: 5.74 Mil/uL (ref 4.22–5.81)
WBC: 7.7 10*3/uL (ref 4.5–10.5)

## 2011-07-26 LAB — COMPREHENSIVE METABOLIC PANEL
ALT: 13 U/L (ref 0–53)
AST: 16 U/L (ref 0–37)
Albumin: 4 g/dL (ref 3.5–5.2)
Alkaline Phosphatase: 46 U/L (ref 39–117)
Glucose, Bld: 283 mg/dL — ABNORMAL HIGH (ref 70–99)
Potassium: 4.5 mEq/L (ref 3.5–5.1)
Sodium: 135 mEq/L (ref 135–145)
Total Bilirubin: 0.3 mg/dL (ref 0.3–1.2)
Total Protein: 7.6 g/dL (ref 6.0–8.3)

## 2011-07-26 LAB — HEMOGLOBIN A1C: Hgb A1c MFr Bld: 12.8 % — ABNORMAL HIGH (ref 4.6–6.5)

## 2011-07-26 MED ORDER — HYDROXYZINE HCL 10 MG PO TABS: 10.0000 mg | ORAL_TABLET | ORAL | Status: AC | PRN

## 2011-07-26 MED ORDER — METHYLPREDNISOLONE ACETATE 80 MG/ML IJ SUSP: 120.0000 mg | Freq: Once | INTRAMUSCULAR | Status: DC

## 2011-07-26 NOTE — Progress Notes (Signed)
Subjective:    Patient ID: Mark Oconnor, male    DOB: 10/08/1980, 31 y.o.   MRN: [id number]  Rash This is a chronic problem. The current episode started more than 1 year ago. The problem has been gradually worsening since onset. The affected locations include the torso, left arm and face. The rash is characterized by itchiness and dryness. He was exposed to nothing. Associated symptoms include facial edema. Pertinent negatives include no anorexia, congestion, cough, diarrhea, eye pain, fatigue, fever, joint pain, nail changes, rhinorrhea, shortness of breath, sore throat or vomiting. Past treatments include nothing. His past medical history is significant for eczema.  Diabetes He presents for his follow-up diabetic visit. He has type 2 diabetes mellitus. His disease course has been stable. Pertinent negatives for hypoglycemia include no pallor. Pertinent negatives for diabetes include no blurred vision, no chest pain, no fatigue, no foot paresthesias, no foot ulcerations, no polydipsia, no polyphagia, no polyuria, no visual change, no weakness and no weight loss. Symptoms are stable. There are no diabetic complications. Risk factors for coronary artery disease include no known risk factors. Current diabetic treatment includes oral agent (dual therapy) and intensive insulin program. He is compliant with treatment most of the time. He is following a generally unhealthy diet. When asked about meal planning, he reported none. He never participates in exercise. There is no change in his home blood glucose trend. An ACE inhibitor/angiotensin II receptor blocker is being taken. He does not see a podiatrist.Eye exam is current.      Review of Systems  Constitutional: Negative for fever, chills, weight loss, diaphoresis, activity change, appetite change, fatigue and unexpected weight change.  HENT: Positive for facial swelling. Negative for hearing loss, ear pain, nosebleeds, congestion, sore throat,  rhinorrhea, trouble swallowing, neck pain, neck stiffness, voice change, tinnitus and ear discharge.   Eyes: Negative for blurred vision and pain.  Respiratory: Negative for cough and shortness of breath.   Cardiovascular: Negative for chest pain, palpitations and leg swelling.  Gastrointestinal: Negative for nausea, vomiting, abdominal pain, diarrhea, constipation, blood in stool, abdominal distention, anal bleeding, rectal pain and anorexia.  Genitourinary: Negative for polyuria.  Musculoskeletal: Negative for myalgias, back pain, joint pain, joint swelling, arthralgias and gait problem.  Skin: Positive for rash. Negative for nail changes, color change, pallor and wound.  Neurological: Negative for weakness.  Hematological: Negative for polydipsia, polyphagia and adenopathy. Does not bruise/bleed easily.  Psychiatric/Behavioral: Negative.        Objective:   Physical Exam  Vitals reviewed. Constitutional: He is oriented to person, place, and time. He appears well-developed and well-nourished. No distress.  HENT:  Head: Normocephalic and atraumatic.  Mouth/Throat: No oropharyngeal exudate.  Eyes: Conjunctivae are normal. Right eye exhibits no discharge. Left eye exhibits no discharge. No scleral icterus.  Neck: Normal range of motion. Neck supple. No JVD present. No tracheal deviation present. No thyromegaly present.  Cardiovascular: Normal rate, regular rhythm, normal heart sounds and intact distal pulses.  Exam reveals no gallop and no friction rub.   No murmur heard. Pulmonary/Chest: Effort normal and breath sounds normal. No stridor. No respiratory distress. He has no wheezes. He has no rales. He exhibits no tenderness.  Abdominal: Soft. Bowel sounds are normal. He exhibits no distension and no mass. There is no tenderness. There is no rebound and no guarding.  Musculoskeletal: Normal range of motion. He exhibits no edema and no tenderness.  Lymphadenopathy:    He has no cervical  adenopathy.  Neurological:  He is oriented to person, place, and time.  Skin: Skin is warm, dry and intact. Rash noted. No abrasion, no bruising, no ecchymosis, no laceration, no lesion, no petechiae and no purpura noted. Rash is macular. Rash is not papular, not maculopapular, not nodular, not pustular, not vesicular and not urticarial. He is not diaphoretic. No cyanosis or erythema. No pallor. Nails show no clubbing.          He has hyperpigmented macules over his left arm and left torso, the rash ends at the midline, over the left face there is mild erythema and swelling. There are no urticarial wheals.  Psychiatric: He has a normal mood and affect. His behavior is normal. Judgment and thought content normal.      Lab Results  Component Value Date   WBC 7.3 04/26/2011   HGB 19.4* 05/02/2011   HCT 57.0* 05/02/2011   PLT 233.0 04/26/2011   GLUCOSE 190* 05/02/2011   CHOL 224* 04/26/2011   TRIG 228.0* 04/26/2011   HDL 41.80 04/26/2011   LDLDIRECT 140.2 04/26/2011   ALT 18 04/26/2011   AST 16 04/26/2011   NA 140 05/02/2011   K 3.8 05/02/2011   CL 105 05/02/2011   CREATININE 1.10 05/02/2011   BUN 19 05/02/2011   CO2 29 04/26/2011   TSH 0.48 04/26/2011   PSA 0.53 04/26/2011   HGBA1C 12.8* 04/26/2011      Assessment & Plan:

## 2011-07-26 NOTE — Patient Instructions (Signed)
Eczema Atopic dermatitis, or eczema, is an inherited type of sensitive skin. Often people with eczema have a family history of allergies, asthma, or hay fever. It causes a red itchy rash and dry scaly skin. The itchiness may occur before the skin rash and may be very intense. It is not contagious. Eczema is generally worse during the cooler winter months and often improves with the warmth of summer. Eczema usually starts showing signs in infancy. Some children outgrow eczema, but it may last through adulthood. Flare-ups may be caused by:  Eating something or contact with something you are sensitive or allergic to.   Stress.  DIAGNOSIS  The diagnosis of eczema is usually based upon symptoms and medical history. TREATMENT  Eczema cannot be cured, but symptoms usually can be controlled with treatment or avoidance of allergens (things to which you are sensitive or allergic to).  Controlling the itching and scratching.   Use over-the-counter antihistamines as directed for itching. It is especially useful at night when the itching tends to be worse.   Use over-the-counter steroid creams as directed for itching.   Scratching makes the rash and itching worse and may cause impetigo (a skin infection) if fingernails are contaminated (dirty).   Keeping the skin well moisturized with creams every day. This will seal in moisture and help prevent dryness. Lotions containing alcohol and water can dry the skin and are not recommended.   Limiting exposure to allergens.   Recognizing situations that cause stress.   Developing a plan to manage stress.  HOME CARE INSTRUCTIONS   Take prescription and over-the-counter medicines as directed by your caregiver.   Do not use anything on the skin without checking with your caregiver.   Keep baths or showers short (5 minutes) in warm (not hot) water. Use mild cleansers for bathing. You may add non-perfumed bath oil to the bath water. It is best to avoid soap and  bubble bath.   Immediately after a bath or shower, when the skin is still damp, apply a moisturizing ointment to the entire body. This ointment should be a petroleum ointment. This will seal in moisture and help prevent dryness. The thicker the ointment the better. These should be unscented.   Keep fingernails cut short and wash hands often. If your child has eczema, it may be necessary to put soft gloves or mittens on your child at night.   Dress in clothes made of cotton or cotton blends. Dress lightly, as heat increases itching.   Avoid foods that may cause flare-ups. Common foods include cow's milk, peanut butter, eggs and wheat.   Keep a child with eczema away from anyone with fever blisters. The virus that causes fever blisters (herpes simplex) can cause a serious skin infection in children with eczema.  SEEK MEDICAL CARE IF:   Itching interferes with sleep.   The rash gets worse or is not better within one week following treatment.   The rash looks infected (pus or soft yellow scabs).   You or your child has an oral temperature above 102 F (38.9 C).   Your baby is older than 3 months with a rectal temperature of 100.5 F (38.1 C) or higher for more than 1 day.   The rash flares up after contact with someone who has fever blisters.  SEEK IMMEDIATE MEDICAL CARE IF:   Your baby is older than 3 months with a rectal temperature of 102 F (38.9 C) or higher.   Your baby is older than   3 months or younger with a rectal temperature of 100.4 F (38 C) or higher.  Document Released: 04/21/2000 Document Revised: 04/13/2011 Document Reviewed: 02/24/2009 [employer] Patient Information 2012 [employer].Diabetes, Type 2 Diabetes is a long-lasting (chronic) disease. In type 2 diabetes, the pancreas does not make enough insulin (a hormone), and the body does not respond normally to the insulin that is made. This type of diabetes was also previously called adult-onset diabetes. It usually  occurs after the age of 40, but it can occur at any age.  CAUSES  Type 2 diabetes happens because the pancreasis not making enough insulin or your body has trouble using the insulin that your pancreas does make properly. SYMPTOMS   Drinking more than usual.   Urinating more than usual.   Blurred vision.   Dry, itchy skin.   Frequent infections.   Feeling more tired than usual (fatigue).  DIAGNOSIS The diagnosis of type 2 diabetes is usually made by one of the following tests:  Fasting blood glucose test. You will not eat for at least 8 hours and then take a blood test.   Random blood glucose test. Your blood glucose (sugar) is checked at any time of the day regardless of when you ate.   Oral glucose tolerance test (OGTT). Your blood glucose is measured after you have not eaten (fasted) and then after you drink a glucose containing beverage.  TREATMENT   Healthy eating.   Exercise.   Medicine, if needed.   Monitoring blood glucose.   Seeing your caregiver regularly.  HOME CARE INSTRUCTIONS   Check your blood glucose at least once a day. More frequent monitoring may be necessary, depending on your medicines and on how well your diabetes is controlled. Your caregiver will advise you.   Take your medicine as directed by your caregiver.   Do not smoke.   Make wise food choices. Ask your caregiver for information. Weight loss can improve your diabetes.   Learn about low blood glucose (hypoglycemia) and how to treat it.   Get your eyes checked regularly.   Have a yearly physical exam. Have your blood pressure checked and your blood and urine tested.   Wear a pendant or bracelet saying that you have diabetes.   Check your feet every night for cuts, sores, blisters, and redness. Let your caregiver know if you have any problems.  SEEK MEDICAL CARE IF:   You have problems keeping your blood glucose in target range.   You have problems with your medicines.   You have  symptoms of an illness that do not improve after 24 hours.   You have a sore or wound that is not healing.   You notice a change in vision or a new problem with your vision.   You have a fever.  MAKE SURE YOU:  Understand these instructions.   Will watch your condition.   Will get help right away if you are not doing well or get worse.  Document Released: 04/24/2005 Document Revised: 04/13/2011 Document Reviewed: 10/10/2010 [employer] Patient Information 2012 [employer]. 

## 2011-07-27 ENCOUNTER — Encounter: Payer: Self-pay | Admitting: Internal Medicine

## 2011-07-27 NOTE — Assessment & Plan Note (Signed)
His BP is well controlled, I will check his lytes and renal function today 

## 2011-07-27 NOTE — Assessment & Plan Note (Signed)
I will check his FLP today 

## 2011-07-27 NOTE — Assessment & Plan Note (Signed)
This rash he tells me has been present off/on for 5 years, he saw a dermatologist and was told that it is eczema, I have elected to treat it with depo-medrol IM and anti-histamines and have also asked him to see an allergist to investigate about what he is allergic to

## 2011-07-27 NOTE — Assessment & Plan Note (Addendum)
His blood sugar remains very high so I have asked him to see diabetic education and ENDO for further evaluation and treatment, I will check his renal function today

## 2011-08-25 ENCOUNTER — Ambulatory Visit (INDEPENDENT_AMBULATORY_CARE_PROVIDER_SITE_OTHER): Payer: BC Managed Care – PPO | Admitting: Internal Medicine

## 2011-08-25 ENCOUNTER — Encounter: Payer: Self-pay | Admitting: Internal Medicine

## 2011-08-25 ENCOUNTER — Ambulatory Visit: Payer: BC Managed Care – PPO | Admitting: Endocrinology

## 2011-08-25 ENCOUNTER — Ambulatory Visit (INDEPENDENT_AMBULATORY_CARE_PROVIDER_SITE_OTHER): Payer: BC Managed Care – PPO | Admitting: Endocrinology

## 2011-08-25 VITALS — BP 122/84 | HR 80 | Temp 98.5°F | Resp 16 | Wt 319.8 lb

## 2011-08-25 VITALS — BP 122/88 | HR 80 | Temp 98.2°F | Ht 76.0 in | Wt 319.0 lb

## 2011-08-25 DIAGNOSIS — IMO0001 Reserved for inherently not codable concepts without codable children: Secondary | ICD-10-CM

## 2011-08-25 DIAGNOSIS — E782 Mixed hyperlipidemia: Secondary | ICD-10-CM

## 2011-08-25 DIAGNOSIS — I10 Essential (primary) hypertension: Secondary | ICD-10-CM

## 2011-08-25 MED ORDER — INSULIN GLARGINE 100 UNIT/ML ~~LOC~~ SOLN: 60.0000 [IU] | Freq: Every day | SUBCUTANEOUS | Status: DC

## 2011-08-25 MED ORDER — INSULIN LISPRO 100 UNIT/ML ~~LOC~~ SOLN: SUBCUTANEOUS | Status: DC

## 2011-08-25 NOTE — Progress Notes (Signed)
Subjective:    Patient ID: Mark Oconnor, male    DOB: 11-16-1980, 31 y.o.   MRN: [id number]  Diabetes He presents for his follow-up diabetic visit. He has type 2 diabetes mellitus. His disease course has been stable. There are no hypoglycemic associated symptoms. Pertinent negatives for hypoglycemia include no dizziness, headaches, pallor, seizures, speech difficulty or tremors. Pertinent negatives for diabetes include no blurred vision, no chest pain, no fatigue, no foot paresthesias, no foot ulcerations, no polydipsia, no polyphagia, no polyuria, no visual change, no weakness and no weight loss. There are no hypoglycemic complications. There are no diabetic complications. Current diabetic treatment includes intensive insulin program and oral agent (dual therapy). He is compliant with treatment all of the time. His weight is stable. He is following a generally unhealthy diet. When asked about meal planning, he reported none. There is no change in his home blood glucose trend. An ACE inhibitor/angiotensin II receptor blocker is being taken. He does not see a podiatrist.Eye exam is current.      Review of Systems  Constitutional: Negative for fever, chills, weight loss, diaphoresis, activity change, appetite change, fatigue and unexpected weight change.  HENT: Negative.   Eyes: Negative.  Negative for blurred vision.  Respiratory: Negative for cough, chest tightness, shortness of breath, wheezing and stridor.   Cardiovascular: Negative for chest pain, palpitations and leg swelling.  Gastrointestinal: Negative for nausea, vomiting, abdominal pain, diarrhea, constipation and anal bleeding.  Genitourinary: Negative.  Negative for polyuria.  Musculoskeletal: Negative for myalgias, back pain, joint swelling, arthralgias and gait problem.  Skin: Negative for color change, pallor, rash and wound.  Neurological: Negative for dizziness, tremors, seizures, syncope, facial asymmetry, speech difficulty,  weakness, light-headedness, numbness and headaches.  Hematological: Negative for polydipsia, polyphagia and adenopathy. Does not bruise/bleed easily.  Psychiatric/Behavioral: Negative.        Objective:   Physical Exam  Vitals reviewed. Constitutional: He is oriented to person, place, and time. He appears well-developed and well-nourished. No distress.  HENT:  Head: Normocephalic and atraumatic.  Mouth/Throat: Oropharynx is clear and moist. No oropharyngeal exudate.  Eyes: Conjunctivae are normal. Right eye exhibits no discharge. Left eye exhibits no discharge. No scleral icterus.  Neck: Normal range of motion. Neck supple. No JVD present. No tracheal deviation present. No thyromegaly present.  Cardiovascular: Normal rate, regular rhythm, normal heart sounds and intact distal pulses.  Exam reveals no gallop and no friction rub.   No murmur heard. Pulmonary/Chest: Effort normal and breath sounds normal. No stridor. No respiratory distress. He has no wheezes. He has no rales. He exhibits no tenderness.  Abdominal: Soft. Bowel sounds are normal. He exhibits no distension and no mass. There is no tenderness. There is no rebound and no guarding.  Musculoskeletal: Normal range of motion. He exhibits no edema and no tenderness.  Lymphadenopathy:    He has no cervical adenopathy.  Neurological: He is oriented to person, place, and time.  Skin: Skin is warm and dry. No rash noted. He is not diaphoretic. No erythema. No pallor.  Psychiatric: He has a normal mood and affect. His behavior is normal. Judgment and thought content normal.     Lab Results  Component Value Date   WBC 7.7 07/26/2011   HGB 14.7 07/26/2011   HCT 44.7 07/26/2011   PLT 234.0 07/26/2011   GLUCOSE 283* 07/26/2011   CHOL 183 07/26/2011   TRIG 162.0* 07/26/2011   HDL 43.40 07/26/2011   LDLDIRECT 140.2 04/26/2011   LDLCALC 107*  07/26/2011   ALT 13 07/26/2011   AST 16 07/26/2011   NA 135 07/26/2011   K 4.5 07/26/2011   CL 98  07/26/2011   CREATININE 1.1 07/26/2011   BUN 13 07/26/2011   CO2 28 07/26/2011   TSH 0.57 07/26/2011   PSA 0.53 04/26/2011   HGBA1C 12.8* 07/26/2011      Assessment & Plan:

## 2011-08-25 NOTE — Progress Notes (Signed)
Subjective:    Patient ID: Mark Oconnor, male    DOB: 09-07-1980, 31 y.o.   MRN: [id number]  HPI pt states 2 years h/o dm.  he is unaware of any chronic complications.  he has been on insulin since dx.  pt says neither his diet nor exercise are good. Pt states few mos of intermittent moderate pain of the feet, but no assoc numbness.  Pt says he has hypoglycemia if he takes humalog, but does not eat.  Other than that, cbg's vary from 200-350.  It is in general higher as the day goes on. Past Medical History  Diagnosis Date  . Diabetes mellitus   . Hyperlipidemia   . Hypertension     No past surgical history on file.  History   Social History  . Marital Status: Single    Spouse Name: N/A    Number of Children: N/A  . Years of Education: N/A   Occupational History  . Not on file.   Social History Main Topics  . Smoking status: Current Everyday Smoker -- 0.5 packs/day for 15 years    Types: Cigarettes  . Smokeless tobacco: Never Used  . Alcohol Use: Yes     occasional  . Drug Use: No  . Sexually Active: Yes   Other Topics Concern  . Not on file   Social History Narrative  . No narrative on file    Current Outpatient Prescriptions on File Prior to Visit  Medication Sig Dispense Refill  . dicyclomine (BENTYL) 20 MG tablet Take 1 tablet (20 mg total) by mouth 2 (two) times daily.  20 tablet  0  . insulin glargine (LANTUS SOLOSTAR) 100 UNIT/ML injection Inject 60 Units into the skin at bedtime.  3 mL  11  . insulin lispro (HUMALOG PEN) 100 UNIT/ML injection Inject 25 Units into the skin 3 (three) times daily before meals.  3 mL  11  . Olmesartan-Amlodipine-HCTZ (TRIBENZOR) 40-5-12.5 MG TABS Take 1 tablet by mouth daily.  28 tablet  0  . RABEprazole (ACIPHEX) 20 MG tablet Take 1 tablet (20 mg total) by mouth daily.  15 tablet  0  . rosuvastatin (CRESTOR) 20 MG tablet Take 20 mg by mouth daily.        . Saxagliptin-Metformin (KOMBIGLYZE XR) 2.09-998 MG TB24 Take 1 tablet  by mouth daily.  120 tablet  0  . Vardenafil HCl 10 MG TBDP Take 1 tablet by mouth as needed.  16 tablet  0   Current Facility-Administered Medications on File Prior to Visit  Medication Dose Route Frequency Provider Last Rate Last Dose  . DISCONTD: methylPREDNISolone acetate (DEPO-MEDROL) injection 120 mg  120 mg Intramuscular Once Etta Grandchild, MD        No Known Allergies  Family History  Problem Relation Age of Onset  . Diabetes Father   . Cancer Neg Hx   . Heart disease Neg Hx   . Hyperlipidemia Neg Hx   . Hypertension Neg Hx     BP 122/88  Pulse 80  Temp(Src) 98.2 F (36.8 C) (Oral)  Ht 6\' 4"  (1.93 m)  Wt 319 lb (144.697 kg)  BMI 38.83 kg/m2  SpO2 97%  Review of Systems denies weight loss, blurry vision, headache, chest pain, sob, n/v, cramps, excessive diaphoresis, memory loss, depression, and easy bruising.  He has urinary frequency, rhinorrhea, and erectile dysfunction.    Objective:   Physical Exam VS: see vs page GEN: no distress HEAD: head: no deformity eyes: no  periorbital swelling, no proptosis external nose and ears are normal mouth: no lesion seen NECK: supple, thyroid is not enlarged CHEST WALL: no deformity LUNGS: clear to auscultation BREASTS:  pseudogynecomastia CV: reg rate and rhythm, no murmur ABD: abdomen is soft, nontender.  no hepatosplenomegaly.  not distended.  no hernia MUSCULOSKELETAL: muscle bulk and strength are grossly normal.  no obvious joint swelling.  gait is normal and steady EXTEMITIES: no deformity.  no ulcer on the feet.  feet are of normal color and temp.  no edema PULSES: dorsalis pedis intact bilat.  no carotid bruit NEURO:  cn 2-12 grossly intact.   readily moves all 4's.  sensation is intact to touch on the feet SKIN:  Normal texture and temperature.  No rash or suspicious lesion is visible.   NODES:  None palpable at the neck PSYCH: alert, oriented x3.  Does not appear anxious nor depressed.    Lab Results    Component Value Date   HGBA1C 12.8* 07/26/2011       Assessment & Plan:  DM, needs increased rx Foot pain, uncertain etiology.  Uncertain if neuropathic ED, prob due to DM Claudication, contributed to by dm

## 2011-08-25 NOTE — Patient Instructions (Addendum)
good diet and exercise habits significanly improve the control of your diabetes.  please let me know if you wish to be referred to a dietician.  high blood sugar is very risky to your health.  you should see an eye doctor every year. controlling your blood pressure and cholesterol drastically reduces the damage diabetes does to your body.  this also applies to quitting smoking.  please discuss these with your doctor.  you should take an aspirin every day, unless you have been advised by a doctor not to. check your blood sugar 2 times a day.  vary the time of day when you check, between before the 3 meals, and at bedtime.  also check if you have symptoms of your blood sugar being too high or too low.  please keep a record of the readings and bring it to your next appointment here.  please call us sooner if your blood sugar goes below 70, or if it stays over 200. You should take the humalog just before you eat, and only when you eat. Change humalog to 3x a day (just before each meal) 15-50-50 units. Same lantus.  Stop "kombliglyze." Please come back for a follow-up appointment in 2 weeks.

## 2011-08-27 ENCOUNTER — Encounter: Payer: Self-pay | Admitting: Internal Medicine

## 2011-08-27 MED ORDER — OLMESARTAN-AMLODIPINE-HCTZ 40-5-12.5 MG PO TABS: 1.0000 | ORAL_TABLET | Freq: Every day | ORAL | Status: DC

## 2011-08-27 NOTE — Assessment & Plan Note (Signed)
He is doing well on crestor 

## 2011-08-27 NOTE — Assessment & Plan Note (Signed)
His BP is well controlled 

## 2011-08-27 NOTE — Assessment & Plan Note (Signed)
He sees ENDO about this today

## 2011-09-07 ENCOUNTER — Institutional Professional Consult (permissible substitution): Payer: BC Managed Care – PPO | Admitting: Internal Medicine

## 2011-09-18 ENCOUNTER — Ambulatory Visit: Payer: BC Managed Care – PPO | Admitting: *Deleted

## 2011-09-26 ENCOUNTER — Ambulatory Visit (INDEPENDENT_AMBULATORY_CARE_PROVIDER_SITE_OTHER): Payer: BC Managed Care – PPO | Admitting: Internal Medicine

## 2011-09-26 ENCOUNTER — Encounter: Payer: Self-pay | Admitting: Internal Medicine

## 2011-09-26 ENCOUNTER — Other Ambulatory Visit: Payer: BC Managed Care – PPO

## 2011-09-26 VITALS — BP 124/88 | HR 76 | Ht 76.0 in | Wt 324.2 lb

## 2011-09-26 DIAGNOSIS — Z840 Family history of diseases of the skin and subcutaneous tissue: Secondary | ICD-10-CM

## 2011-09-26 DIAGNOSIS — R21 Rash and other nonspecific skin eruption: Secondary | ICD-10-CM

## 2011-09-26 DIAGNOSIS — L209 Atopic dermatitis, unspecified: Secondary | ICD-10-CM

## 2011-09-26 DIAGNOSIS — L2089 Other atopic dermatitis: Secondary | ICD-10-CM

## 2011-09-26 NOTE — Patient Instructions (Signed)
Order lab- Allergy Profile   Dx atopic dermatitis  Consider trying a daily blocking antihistamine like Claritin/ loratadine for a month to see if it prevents flares.

## 2011-09-26 NOTE — Progress Notes (Signed)
09/26/11- 31 yoM smoker, diabetic, referred courtesy of Dr Dr Sanda Linger for allergy evaluation because of itching. For several years as an adult, he has associated generalized itching with situations when he gets heated. He has a long-standing history of mild eczema but says this problem is different. It has been most pronounced in the past year and mostly involves his upper body without visible rash. He also begins to itch with hard laughter. Itching is relieved by cooling off.  He has done better since the cortisone injection one month ago. He estimates episodes happening about once a week and lasting a few hours to half a day before fading. He says his mother is similar but she blames her cholesterol medication. History of mild seasonal allergic rhinitis without asthma. Since elementary school he has had patches of hyperpigmented skin. A dermatologist years ago called "skin blotch". He has used Oil of [place] and [place] with uncertain benefit. Medical problems include hypertension, insulin-dependent diabetes, elevated cholesterol. He denies intolerance to latex, contrast dye or aspirin. Positive for hepatitis B. 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  Prior to Admission medications   Medication Sig Start Date End Date Taking? Authorizing Provider  insulin glargine (LANTUS SOLOSTAR) 100 UNIT/ML injection Inject 60 Units into the skin at bedtime. 08/25/11 08/24/12 Yes Romero Belling, MD  insulin lispro (HUMALOG) 100 UNIT/ML injection 3x a day (just before each meal) 15-50-50 units 08/25/11  Yes Romero Belling, MD  Olmesartan-Amlodipine-HCTZ (TRIBENZOR) 40-5-12.5 MG TABS Take 1 tablet by mouth daily. 08/27/11  Yes Etta Grandchild, MD  ONE TOUCH ULTRA TEST test strip 1 each by Other route.  05/23/11  Yes Historical Provider, MD  rosuvastatin (CRESTOR) 20 MG tablet Take 20 mg by mouth daily.     Yes Historical Provider, MD   Past  Medical History  Diagnosis Date  . Diabetes mellitus   . Hyperlipidemia   . Hypertension    History reviewed. No pertinent past surgical history. Family History  Problem Relation Age of Onset  . Diabetes Father   . Cancer Neg Hx   . Heart disease Neg Hx   . Hyperlipidemia Neg Hx   . Hypertension Neg Hx    History   Social History  . Marital Status: Single    Spouse Name: N/A    Number of Children: N/A  . Years of Education: N/A   Occupational History  . Not on file.   Social History Main Topics  . Smoking status: Current Everyday Smoker -- 0.5 packs/day for 15 years    Types: Cigarettes  . Smokeless tobacco: Never Used  . Alcohol Use: No     occasional  . Drug Use: No  . Sexually Active: Yes   Other Topics Concern  . Not on file   Social History Narrative  . No narrative on file   ROS-see HPI Constitutional:   No-   weight loss, night sweats, fevers, chills, fatigue, lassitude. HEENT:   No-  headaches, difficulty swallowing, tooth/dental problems, sore throat,       No-  sneezing, ear ache, nasal congestion, post nasal drip,  CV:  No-   chest pain, orthopnea, PND, swelling in lower extremities, anasarca, dizziness, palpitations Resp: No-   shortness of breath with exertion or at rest.              No-   productive cough,  No non-productive cough,  No-  coughing up of blood.              No-   change in color of mucus.  No- wheezing.   Skin: Per HPI GI:  No-   heartburn, indigestion, abdominal pain, nausea, vomiting, diarrhea,                 change in bowel habits, loss of appetite GU: No-   dysuria, change in color of urine, no urgency or frequency.  No- flank pain. MS:  No-   joint pain or swelling.  No- decreased range of motion.  No- back pain. Neuro-     nothing unusual Psych:  No- change in mood or affect. No depression or anxiety.  No memory loss.  OBJ- Physical Exam General- Alert, Oriented, Affect-appropriate, Distress- none acute, obese Skin-  "blotchy" areas of hyperpigmentation on his arms-not excoriated or raised. These become pinker if rubbed gently Lymphadenopathy- none Head- atraumatic            Eyes- Gross vision intact, PERRLA, conjunctivae and secretions clear            Ears- Hearing, canals-normal            Nose- Clear, no-Septal dev, mucus, polyps, erosion, perforation             Throat- Mallampati II , mucosa clear , drainage- none, tonsils- atrophic Neck- flexible , trachea midline, no stridor , thyroid nl, carotid no bruit Chest - symmetrical excursion , unlabored           Heart/CV- RRR , no murmur , no gallop  , no rub, nl s1 s2                           - JVD- none , edema- none, stasis changes- none, varices- none           Lung- clear to P&A, wheeze- none, cough- none , dullness-none, rub- none           Chest wall-  Abd- tender-no, distended-no, bowel sounds-present, HSM- no Br/ Gen/ Rectal- Not done, not indicated Extrem- cyanosis- none, clubbing, none, atrophy- none, strength- nl Neuro- grossly intact to observation

## 2011-09-27 LAB — ALLERGY FULL PROFILE
Allergen,Goose feathers, e70: <0.1 kU/L (ref <0.35)
Aspergillus fumigatus, IgG: 1.13 kU/L — ABNORMAL HIGH (ref <0.35)
Bermuda Grass: 0.37 kU/L — ABNORMAL HIGH (ref <0.35)
Candida Albicans: 0.85 kU/L — ABNORMAL HIGH (ref <0.35)
Curvularia lunata: 2.1 kU/L — ABNORMAL HIGH (ref <0.35)
D. farinae: 6.05 kU/L — ABNORMAL HIGH (ref <0.35)
Elm IgE: 1.04 kU/L — ABNORMAL HIGH (ref <0.35)
Fescue: 2.14 kU/L — ABNORMAL HIGH (ref <0.35)
G009 Red Top: 1.98 kU/L — ABNORMAL HIGH (ref <0.35)
IgE (Immunoglobulin E), Serum: 103.6 IU/mL (ref 0.0–180.0)
Lamb's Quarters: 0.17 kU/L (ref <0.35)
Oak: 1 kU/L — ABNORMAL HIGH (ref <0.35)
Plantain: 0.63 kU/L — ABNORMAL HIGH (ref <0.35)
Stemphylium Botryosum: 1.12 kU/L — ABNORMAL HIGH (ref <0.35)
Timothy Grass: 1.74 kU/L — ABNORMAL HIGH (ref <0.35)

## 2011-09-29 NOTE — Progress Notes (Signed)
Quick Note:  Pt aware of results. Will follow up with CY on Friday 11-03-11 at 3:30 pm. ______

## 2011-09-30 DIAGNOSIS — R21 Rash and other nonspecific skin eruption: Secondary | ICD-10-CM | POA: Insufficient documentation

## 2011-09-30 DIAGNOSIS — Z840 Family history of diseases of the skin and subcutaneous tissue: Secondary | ICD-10-CM | POA: Insufficient documentation

## 2011-09-30 NOTE — Assessment & Plan Note (Signed)
This does not seem to be a heat-induced urticaria, as he sees no visible skin changes. He is pretty clear that the key is heating and release comes from cooling. It may be a form of neurogenic pruritus. I'm not sure if it could have a relation to autonomic instability from his diabetes. I don't recognize connection to medication. Plan-we will address the allergy question. Allergy profile. Try daily antihistamine.

## 2011-09-30 NOTE — Assessment & Plan Note (Signed)
This does not look like an active inflammation or infection. Consider biopsy. I don't know if it has any relation to his complaint of pruritus.

## 2011-10-25 ENCOUNTER — Encounter: Payer: BC Managed Care – PPO | Attending: Internal Medicine | Admitting: *Deleted

## 2011-10-25 ENCOUNTER — Encounter: Payer: Self-pay | Admitting: *Deleted

## 2011-10-25 VITALS — Ht 76.0 in | Wt 319.4 lb

## 2011-10-25 DIAGNOSIS — Z713 Dietary counseling and surveillance: Secondary | ICD-10-CM | POA: Insufficient documentation

## 2011-10-25 DIAGNOSIS — IMO0001 Reserved for inherently not codable concepts without codable children: Secondary | ICD-10-CM

## 2011-10-25 DIAGNOSIS — E119 Type 2 diabetes mellitus without complications: Secondary | ICD-10-CM | POA: Insufficient documentation

## 2011-10-25 NOTE — Patient Instructions (Addendum)
Goals:  Follow Diabetes Meal Plan as instructed  Eat 3 meals and 2 snacks, every 3-5 hrs  Limit carbohydrate intake to 45-60 grams carbohydrate/meal  Limit carbohydrate intake to 30 grams carbohydrate/snack  Add lean protein foods to meals/snacks  Monitor glucose levels as instructed by your doctor  Aim for 10 mins of physical activity daily  Bring food record and glucose log to your next nutrition visit  Limit extra fats- choose more lean proteins and leaner methods of food preparation  Limit portions  Treat low blood sugars with 15 g carbohydrate

## 2011-10-25 NOTE — Progress Notes (Signed)
  Medical Nutrition Therapy:  Appt start time: 0626 end time:  1630.   Assessment:  Primary concerns today: diabetes.   MEDICATIONS: see list   DIETARY INTAKE:  Usual eating pattern includes 2-3 meals and 0 snacks per day.  Everyday foods include energy-dense fast food and snacks from vending machine.  Avoided foods include none.    24-hr recall:  B ( AM): skips 5 days/week: fast food like [employer] or [place] breakfast sandwich with unsweetened beverages  Snk ( AM): none  L ( PM): fast food or takeout with unsweetened beverages Snk ( PM): none D ( PM): eats at home 2-3 days: [place] or salmon, spaghetti or tacos; eat out other night at fast food with unsweet beverage Snk ( PM): none Beverages: diet soda  Usual physical activity: none currently  Estimated energy needs: 2200 calories 248 g carbohydrates 165 g protein 61 g fat  Progress Towards Goal(s):  In progress.   Nutritional Diagnosis:  NB-1.1 Undesirable food choices related to carbohydrate-containing foods.  As evidenced by HgA1C of 12.8%.    Intervention:  Nutrition counseling provided.  Patient is here today with uncontrolled IDDM2, obesity, HTN, and hyperlipidemia.  He's been diagnosed with DM for several years now and has previously received diabetes education, but has not been adhering to dietary advice.  He admits to making unhealthy food choices.  Reviewed physiology of DM and role of obesity on insulin resistance.  Reviewed progression of DM and why patient is now insulin dependent.  Patient admits to forgetting carb counting so we discussed carb counting and portion control.  Also discussed reading food labels, limiting dietary fats, importance of eating 3 meals a day, and increasing physical activity.  Addressed hypoglycemia and how to treat.  Handouts given during visit include: Carb Counting and Food Label handouts Meal Plan Card  Goals: Follow Diabetes Meal Plan as instructed Eat 3 meals and 2  snacks, every 3-5 hrs Limit carbohydrate intake to 45-60 grams carbohydrate/meal Limit carbohydrate intake to 30 grams carbohydrate/snack Add lean protein foods to meals/snacks Monitor glucose levels as instructed by your doctor Aim for 10 mins of physical activity daily Bring food record and glucose log to your next nutrition visit Limit extra fats- choose more lean proteins and leaner methods of food preparation Limit portions Treat low blood sugars with 15 g carbohydrate Use Calorie [place] when eating out to make smarter choices  Monitoring/Evaluation:  Dietary intake, exercise, GBM, and body weight in 1 month(s).  Review carb counting, as needed, discuss heart healthy eating, physical activity, and smoking cessation

## 2011-11-03 ENCOUNTER — Ambulatory Visit: Payer: BC Managed Care – PPO | Admitting: Internal Medicine

## 2011-11-27 ENCOUNTER — Encounter: Payer: BC Managed Care – PPO | Attending: Internal Medicine | Admitting: *Deleted

## 2011-11-27 VITALS — Ht 76.0 in | Wt 316.0 lb

## 2011-11-27 DIAGNOSIS — IMO0001 Reserved for inherently not codable concepts without codable children: Secondary | ICD-10-CM

## 2011-11-27 DIAGNOSIS — E119 Type 2 diabetes mellitus without complications: Secondary | ICD-10-CM | POA: Insufficient documentation

## 2011-11-27 DIAGNOSIS — Z713 Dietary counseling and surveillance: Secondary | ICD-10-CM | POA: Insufficient documentation

## 2011-11-27 NOTE — Progress Notes (Signed)
  Medical Nutrition Therapy:  Appt start time: 1630 end time:  1700.   Assessment:  Primary concerns today: diabetes follow up.   MEDICATIONS: see list   DIETARY INTAKE:  Usual eating pattern includes 2 meals and 0 snacks per day.  Everyday foods include starches vegetables, meats.  Avoided foods include none.    24-hr recall:  B ( AM): skips  Snk ( AM): candy when encounters hypoglycemia  L ( PM): brings lunch from home: salad, sandwich, carrots Snk ( PM): none D ( PM): no takeout!  Meat, starch, veg cooked at home Snk ( PM): none Beverages: unsweetened beverages, water  Usual physical activity: just bought elliptical machine in 10 minute intervals for 2 days now.   Estimated energy needs: 2000-2200 calories 225 g carbohydrates 150 g protein 56 g fat  Progress Towards Goal(s):  Some progress.  Mark Oconnor has lost 3 pounds since last visit!!  He is bringing his lunch from home and has cut out take-out food.  Lunch and dinner are balanced and nutritious.  He just started exercising too   Nutritional Diagnosis:  NB-1.1 Undesirable food choices related to carbohydrate-containing foods. As evidenced by obesity and HgA1C of 12.8%.     Intervention:  Nutrition counseling provided.  Mark Oconnor is still experiencing hypoglycemia in the mid-morning and is treating with candies.  He skips breakfast daily and takes his insulin, as prescribed.  Strongly encouraged him to have something for breakfast.  Discussed Glucerna or Boost glucose control shake as a breakfast alternative.  Encouraged him to check his blood sugar when he feels symptomatic to see if he indeed is below 60-70 mg/dl.  Encouraged the progress on physical activity and asked him to control exercises.  Also encouraged multivitamin supplements.  Monitoring/Evaluation:  Dietary intake, exercise, GBM, and body weight prn.  Will call after next HgA1C test.

## 2011-11-27 NOTE — Patient Instructions (Signed)
Eat breakfast or drink glucerna Check blood glucose when feel hypoglycemic Continue exercise

## 2011-12-15 ENCOUNTER — Encounter: Payer: Self-pay | Admitting: Internal Medicine

## 2011-12-15 ENCOUNTER — Ambulatory Visit (INDEPENDENT_AMBULATORY_CARE_PROVIDER_SITE_OTHER): Payer: BC Managed Care – PPO | Admitting: Internal Medicine

## 2011-12-15 VITALS — BP 140/102 | HR 83 | Ht 76.0 in | Wt 325.6 lb

## 2011-12-15 DIAGNOSIS — R21 Rash and other nonspecific skin eruption: Secondary | ICD-10-CM

## 2011-12-15 DIAGNOSIS — J3089 Other allergic rhinitis: Secondary | ICD-10-CM

## 2011-12-15 DIAGNOSIS — J302 Other seasonal allergic rhinitis: Secondary | ICD-10-CM

## 2011-12-15 DIAGNOSIS — J309 Allergic rhinitis, unspecified: Secondary | ICD-10-CM

## 2011-12-15 NOTE — Progress Notes (Signed)
09/26/11- 31 yoM smoker, diabetic, referred courtesy of Dr Dr Sanda Linger for allergy evaluation because of itching. For several years as an adult, he has associated generalized itching with situations when he gets heated. He has a long-standing history of mild eczema but says this problem is different. It has been most pronounced in the past year and mostly involves his upper body without visible rash. He also begins to itch with hard laughter. Itching is relieved by cooling off.  He has done better since the cortisone injection one month ago. He estimates episodes happening about once a week and lasting a few hours to half a day before fading. He says his mother is similar but she blames her cholesterol medication. History of mild seasonal allergic rhinitis without asthma. Since elementary school he has had patches of hyperpigmented skin. A dermatologist years ago called "skin blotch". He has used Oil of [place] and [place] with uncertain benefit. Medical problems include hypertension, insulin-dependent diabetes, elevated cholesterol. He denies intolerance to latex, contrast dye or aspirin. Positive for hepatitis B. 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  12/15/11- 31 yoM smoker, diabetic, referred courtesy of Dr Dr Sanda Linger for allergy evaluation because of itching.  c/o runny nose, itchy eyes, and red eyes. States had bronchitis x 2 weeks ago.  Allergy Profile- 09/26/2011-total IgE 103.6, specific elevations for almost everything on the panel. Daily Claritin blocks skin itching which is much better, still with no visible rash. Now has a cold/bronchitis starting 2 weeks ago and improved to residual dry cough. Some itching of eyes and nose. Watery rhinorrhea. Benadryl at bedtime helps sleep.  ROS-see HPI Constitutional:   No-   weight loss, night sweats, fevers, chills, fatigue, lassitude. HEENT:   No-  headaches,  difficulty swallowing, tooth/dental problems, sore throat,       +-  sneezing, ear ache, nasal congestion, +post nasal drip,  CV:  No-   chest pain, orthopnea, PND, swelling in lower extremities, anasarca, dizziness, palpitations Resp: No-   shortness of breath with exertion or at rest.              No-   productive cough, + non-productive cough,  No- coughing up of blood.              No-   change in color of mucus.  No- wheezing.   Skin: Per HPI GI:  No-   heartburn, indigestion, abdominal pain, nausea, vomiting,  GU: . MS:  No-   joint pain or swelling.  No- decreased range of motion.  No- back pain. Neuro-     nothing unusual Psych:  No- change in mood or affect. No depression or anxiety.  No memory loss.  OBJ- Physical Exam General- Alert, Oriented, Affect-appropriate, Distress- none acute, obese Skin- no visible rash  Lymphadenopathy- none Head- atraumatic            Eyes- Gross vision intact, PERRLA, +conjunctivae  mildly injected            Ears- Hearing, canals-normal            Nose- + mucus crusting, no-Septal dev,  polyps, erosion, perforation             Throat- Mallampati III-IV , mucosa clear , drainage- none, tonsils- atrophic Neck- flexible , trachea midline, no stridor , thyroid nl, carotid no bruit Chest - symmetrical excursion , unlabored  Heart/CV- RRR , no murmur , no gallop  , no rub, nl s1 s2                           - JVD- none , edema- none, stasis changes- none, varices- none           Lung- clear to P&A, wheeze- none, cough- none , dullness-none, rub- none           Chest wall-  Abd-  Br/ Gen/ Rectal- Not done, not indicated Extrem- cyanosis- none, clubbing, none, atrophy- none, strength- nl Neuro- grossly intact to observation

## 2011-12-15 NOTE — Patient Instructions (Addendum)
Continue claritin/ loratadine antihistamine daily for the itching. You can skip days if you want to see what it is doing.  Sample Omnaris steroid nasal spray    And   Sample Astepro antihistamine nasal spray Use 1-2 puffs of each in each nostril once daily at bedtime.  Please call as needed

## 2011-12-21 ENCOUNTER — Encounter: Payer: Self-pay | Admitting: Internal Medicine

## 2011-12-21 DIAGNOSIS — J302 Other seasonal allergic rhinitis: Secondary | ICD-10-CM | POA: Insufficient documentation

## 2011-12-21 NOTE — Assessment & Plan Note (Signed)
No visible rash now and pruritus is gone or controlled on antihistamines. He is satisfied with this.

## 2011-12-21 NOTE — Assessment & Plan Note (Signed)
Plan-sample nasal steroid and nasal antihistamine sprays to use in combination. Environmental precautions. On return we will consider whether to schedule allergy skin testing

## 2011-12-22 ENCOUNTER — Other Ambulatory Visit (INDEPENDENT_AMBULATORY_CARE_PROVIDER_SITE_OTHER): Payer: BC Managed Care – PPO

## 2011-12-22 ENCOUNTER — Ambulatory Visit (INDEPENDENT_AMBULATORY_CARE_PROVIDER_SITE_OTHER): Payer: BC Managed Care – PPO | Admitting: Internal Medicine

## 2011-12-22 ENCOUNTER — Ambulatory Visit (INDEPENDENT_AMBULATORY_CARE_PROVIDER_SITE_OTHER)
Admission: RE | Admit: 2011-12-22 | Discharge: 2011-12-22 | Disposition: A | Discharge status: Home / Self Care | Payer: BC Managed Care – PPO | Source: Ambulatory Visit | Attending: Internal Medicine | Admitting: Internal Medicine

## 2011-12-22 ENCOUNTER — Encounter: Payer: Self-pay | Admitting: Internal Medicine

## 2011-12-22 VITALS — BP 130/90 | HR 88 | Temp 98.8°F | Resp 16 | Wt 319.0 lb

## 2011-12-22 DIAGNOSIS — J45901 Unspecified asthma with (acute) exacerbation: Secondary | ICD-10-CM

## 2011-12-22 DIAGNOSIS — R05 Cough: Secondary | ICD-10-CM

## 2011-12-22 DIAGNOSIS — E782 Mixed hyperlipidemia: Secondary | ICD-10-CM

## 2011-12-22 DIAGNOSIS — I10 Essential (primary) hypertension: Secondary | ICD-10-CM

## 2011-12-22 DIAGNOSIS — J4530 Mild persistent asthma, uncomplicated: Secondary | ICD-10-CM | POA: Insufficient documentation

## 2011-12-22 DIAGNOSIS — IMO0001 Reserved for inherently not codable concepts without codable children: Secondary | ICD-10-CM

## 2011-12-22 DIAGNOSIS — R059 Cough, unspecified: Secondary | ICD-10-CM | POA: Insufficient documentation

## 2011-12-22 DIAGNOSIS — Z23 Encounter for immunization: Secondary | ICD-10-CM

## 2011-12-22 LAB — COMPREHENSIVE METABOLIC PANEL
ALT: 16 U/L (ref 0–53)
AST: 26 U/L (ref 0–37)
CO2: 23 mEq/L (ref 19–32)
Calcium: 9.2 mg/dL (ref 8.4–10.5)
Chloride: 101 mEq/L (ref 96–112)
Creatinine, Ser: 1 mg/dL (ref 0.4–1.5)
GFR: 106.81 mL/min (ref >60.00)
Potassium: 4.7 mEq/L (ref 3.5–5.1)
Sodium: 134 mEq/L — ABNORMAL LOW (ref 135–145)
Total Protein: 7.8 g/dL (ref 6.0–8.3)

## 2011-12-22 LAB — CBC WITH DIFFERENTIAL/PLATELET
Basophils Absolute: 0.1 10*3/uL (ref 0.0–0.1)
Eosinophils Absolute: 0.8 10*3/uL — ABNORMAL HIGH (ref 0.0–0.7)
Hemoglobin: 15.6 g/dL (ref 13.0–17.0)
Lymphocytes Relative: 15.2 % (ref 12.0–46.0)
Lymphs Abs: 1.6 10*3/uL (ref 0.7–4.0)
MCHC: 32 g/dL (ref 30.0–36.0)
Monocytes Relative: 5.1 % (ref 3.0–12.0)
Neutro Abs: 7.4 10*3/uL (ref 1.4–7.7)
Platelets: 179 10*3/uL (ref 150.0–400.0)
RDW: 13.4 % (ref 11.5–14.6)

## 2011-12-22 LAB — LIPID PANEL
Total CHOL/HDL Ratio: 5
Triglycerides: 219 mg/dL — ABNORMAL HIGH (ref 0.0–149.0)

## 2011-12-22 LAB — TSH: TSH: 0.84 u[IU]/mL (ref 0.35–5.50)

## 2011-12-22 MED ORDER — FLUTICASONE-SALMETEROL 250-50 MCG/DOSE IN AEPB
1.0000 | INHALATION_SPRAY | Freq: Two times a day (BID) | RESPIRATORY_TRACT | Status: DC
Start: 2011-12-22 — End: 2012-01-23

## 2011-12-22 MED ORDER — HYDROCODONE-HOMATROPINE 5-1.5 MG/5ML PO SYRP: 5.0000 mL | ORAL_SOLUTION | Freq: Three times a day (TID) | ORAL | Status: AC | PRN

## 2011-12-22 MED ORDER — METHYLPREDNISOLONE ACETATE 80 MG/ML IJ SUSP
120.0000 mg | Freq: Once | INTRAMUSCULAR | Status: AC
  Administered 2011-12-22: 120 mg via INTRAMUSCULAR

## 2011-12-22 MED ORDER — ALBUTEROL SULFATE HFA 108 (90 BASE) MCG/ACT IN AERS: 2.0000 | INHALATION_SPRAY | Freq: Four times a day (QID) | RESPIRATORY_TRACT | Status: DC | PRN

## 2011-12-22 NOTE — Assessment & Plan Note (Signed)
FLP today 

## 2011-12-22 NOTE — Patient Instructions (Signed)
Diabetes, Type 2 Diabetes is a long-lasting (chronic) disease. In type 2 diabetes, the pancreas does not make enough insulin (a hormone), and the body does not respond normally to the insulin that is made. This type of diabetes was also previously called adult-onset diabetes. It usually occurs after the age of 81, but it can occur at any age.  CAUSES  Type 2 diabetes happens because the pancreasis not making enough insulin or your body has trouble using the insulin that your pancreas does make properly. SYMPTOMS   Drinking more than usual.   Urinating more than usual.   Blurred vision.   Dry, itchy skin.   Frequent infections.   Feeling more tired than usual (fatigue).  DIAGNOSIS The diagnosis of type 2 diabetes is usually made by one of the following tests:  Fasting blood glucose test. You will not eat for at least 8 hours and then take a blood test.   Random blood glucose test. Your blood glucose (sugar) is checked at any time of the day regardless of when you ate.   Oral glucose tolerance test (OGTT). Your blood glucose is measured after you have not eaten (fasted) and then after you drink a glucose containing beverage.  TREATMENT   Healthy eating.   Exercise.   Medicine, if needed.   Monitoring blood glucose.   Seeing your caregiver regularly.  HOME CARE INSTRUCTIONS   Check your blood glucose at least once a day. More frequent monitoring may be necessary, depending on your medicines and on how well your diabetes is controlled. Your caregiver will advise you.   Take your medicine as directed by your caregiver.   Do not smoke.   Make wise food choices. Ask your caregiver for information. Weight loss can improve your diabetes.   Learn about low blood glucose (hypoglycemia) and how to treat it.   Get your eyes checked regularly.   Have a yearly physical exam. Have your blood pressure checked and your blood and urine tested.   Wear a pendant or bracelet saying  that you have diabetes.   Check your feet every night for cuts, sores, blisters, and redness. Let your caregiver know if you have any problems.  SEEK MEDICAL CARE IF:   You have problems keeping your blood glucose in target range.   You have problems with your medicines.   You have symptoms of an illness that do not improve after 24 hours.   You have a sore or wound that is not healing.   You notice a change in vision or a new problem with your vision.   You have a fever.  MAKE SURE YOU:  Understand these instructions.   Will watch your condition.   Will get help right away if you are not doing well or get worse.  Document Released: 04/24/2005 Document Revised: 04/13/2011 Document Reviewed: 10/10/2010 [facility] Patient Information 2012 [place], [place].Asthma, Adult Asthma is caused by narrowing of the air passages in the lungs. It may be triggered by pollen, dust, animal dander, molds, some foods, respiratory infections, exposure to smoke, exercise, emotional stress or other allergens (things that cause allergic reactions or allergies). Repeat attacks are common. HOME CARE INSTRUCTIONS   Use prescription medications as ordered by your caregiver.   Avoid pollen, dust, animal dander, molds, smoke and other things that cause attacks at home and at work.   You may have fewer attacks if you decrease dust in your home. Electrostatic air cleaners may help.   It may help  to replace your pillows or mattress with materials less likely to cause allergies.   Talk to your caregiver about an action plan for managing asthma attacks at home, including, the use of a peak flow meter which measures the severity of your asthma attack. An action plan can help minimize or stop the attack without having to seek medical care.   If you are not on a fluid restriction, drink 8 to 10 glasses of water each day.   Always have a plan prepared for seeking medical attention, including, calling your physician,  accessing local emergency care, and calling 911 (in the [place] for a severe attack.   Discuss possible exercise routines with your caregiver.   If animal dander is the cause of asthma, you may need to get rid of pets.  SEEK MEDICAL CARE IF:   You have wheezing and shortness of breath even if taking medicine to prevent attacks.   You have muscle aches, chest pain or thickening of sputum.   Your sputum changes from clear or white to yellow, green, gray, or bloody.   You have any problems that may be related to the medicine you are taking (such as a rash, itching, swelling or trouble breathing).  SEEK IMMEDIATE MEDICAL CARE IF:   Your usual medicines do not stop your wheezing or there is increased coughing and/or shortness of breath.   You have increased difficulty breathing.   You have a fever.  MAKE SURE YOU:   Understand these instructions.   Will watch your condition.   Will get help right away if you are not doing well or get worse.  Document Released: 04/24/2005 Document Revised: 04/13/2011 Document Reviewed: 12/11/2007 [facility] Patient Information 2012 [place], [place].

## 2011-12-22 NOTE — Assessment & Plan Note (Signed)
I will check his A1c and will monitor his renal function 

## 2011-12-22 NOTE — Assessment & Plan Note (Signed)
I will check his CXR to see if he has pna, mass, edema 

## 2011-12-22 NOTE — Assessment & Plan Note (Signed)
He was treated with an injection of depo-medrol im and will start advair diskus and albuterol inhaler

## 2011-12-22 NOTE — Assessment & Plan Note (Signed)
His BP is well controlled, I will check his lytes and renal function 

## 2011-12-22 NOTE — Progress Notes (Signed)
Subjective:    Patient ID: Mark Oconnor, male    DOB: 27-Mar-1981, 31 y.o.   MRN: [id number]  Cough This is a chronic problem. The current episode started 1 to 4 weeks ago. The problem has been gradually worsening. The problem occurs every few hours. The cough is non-productive. Associated symptoms include rhinorrhea, shortness of breath and wheezing. Pertinent negatives include no chest pain, chills, ear congestion, ear pain, fever, headaches, heartburn, hemoptysis, myalgias, nasal congestion, postnasal drip, rash, sore throat, sweats or weight loss. Nothing aggravates the symptoms. He has tried nothing for the symptoms.  Diabetes He presents for his follow-up diabetic visit. He has type 2 diabetes mellitus. His disease course has been stable. There are no hypoglycemic associated symptoms. Pertinent negatives for hypoglycemia include no dizziness, headaches, pallor, seizures, speech difficulty, sweats or tremors. Associated symptoms include polydipsia, polyphagia and polyuria. Pertinent negatives for diabetes include no blurred vision, no chest pain, no fatigue, no foot paresthesias, no foot ulcerations, no visual change, no weakness and no weight loss. There are no hypoglycemic complications. There are no diabetic complications. Current diabetic treatment includes intensive insulin program, insulin injections and oral agent (monotherapy). He is compliant with treatment most of the time. His weight is stable. He is following a generally unhealthy diet. Meal planning includes avoidance of concentrated sweets. He has not had a previous visit with a dietician. He never participates in exercise. There is no change in his home blood glucose trend. His breakfast blood glucose range is generally 180-200 mg/dl. His lunch blood glucose range is generally >200 mg/dl. His dinner blood glucose range is generally >200 mg/dl. His highest blood glucose is >200 mg/dl. His overall blood glucose range is >200 mg/dl. An ACE  inhibitor/angiotensin II receptor blocker is being taken. He does not see a podiatrist.Eye exam is current.      Review of Systems  Constitutional: Negative for fever, chills, weight loss, diaphoresis, activity change, appetite change, fatigue and unexpected weight change.  HENT: Positive for rhinorrhea. Negative for ear pain, nosebleeds, congestion, sore throat, sneezing, drooling, mouth sores, trouble swallowing, dental problem, voice change, postnasal drip, sinus pressure and tinnitus.   Eyes: Negative.  Negative for blurred vision.  Respiratory: Positive for cough, shortness of breath and wheezing. Negative for apnea, hemoptysis, choking, chest tightness and stridor.   Cardiovascular: Negative for chest pain, palpitations and leg swelling.  Gastrointestinal: Negative for heartburn, nausea, vomiting, abdominal pain, diarrhea, constipation, blood in stool, abdominal distention, anal bleeding and rectal pain.  Genitourinary: Positive for polyuria.  Musculoskeletal: Negative for myalgias, back pain, joint swelling, arthralgias and gait problem.  Skin: Negative for color change, pallor, rash and wound.  Neurological: Negative for dizziness, tremors, seizures, syncope, facial asymmetry, speech difficulty, weakness, light-headedness, numbness and headaches.  Hematological: Positive for polydipsia and polyphagia. Negative for adenopathy. Does not bruise/bleed easily.  Psychiatric/Behavioral: Negative.        Objective:   Physical Exam  Vitals reviewed. Constitutional: He is oriented to person, place, and time. He appears well-developed and well-nourished.  Non-toxic appearance. He does not have a sickly appearance. He does not appear ill. No distress.  HENT:  Head: Normocephalic and atraumatic.  Mouth/Throat: Oropharynx is clear and moist. No oropharyngeal exudate.  Eyes: Conjunctivae are normal. Right eye exhibits no discharge. Left eye exhibits no discharge. No scleral icterus.  Neck:  Normal range of motion. Neck supple. No JVD present. No tracheal deviation present. No thyromegaly present.  Cardiovascular: Normal rate, regular rhythm, normal heart sounds and  intact distal pulses.  Exam reveals no gallop and no friction rub.   No murmur heard. Pulmonary/Chest: No accessory muscle usage or stridor. Tachypnea noted. No respiratory distress. He has no decreased breath sounds. He has wheezes in the right upper field, the right middle field, the right lower field, the left upper field, the left middle field and the left lower field. He has no rhonchi. He has no rales. He exhibits no tenderness.       He received a jet neb treatment of albuterol and after wards his lungs are clear bilaterally  Abdominal: Soft. Bowel sounds are normal. He exhibits no distension and no mass. There is no tenderness. There is no rebound and no guarding.  Musculoskeletal: Normal range of motion. He exhibits no edema and no tenderness.  Lymphadenopathy:    He has no cervical adenopathy.  Neurological: He is oriented to person, place, and time.  Skin: Skin is warm and dry. No rash noted. He is not diaphoretic. No erythema. No pallor.  Psychiatric: He has a normal mood and affect. His behavior is normal. Judgment and thought content normal.      Lab Results  Component Value Date   WBC 7.7 07/26/2011   HGB 14.7 07/26/2011   HCT 44.7 07/26/2011   PLT 234.0 07/26/2011   GLUCOSE 283* 07/26/2011   CHOL 183 07/26/2011   TRIG 162.0* 07/26/2011   HDL 43.40 07/26/2011   LDLDIRECT 140.2 04/26/2011   LDLCALC 107* 07/26/2011   ALT 13 07/26/2011   AST 16 07/26/2011   NA 135 07/26/2011   K 4.5 07/26/2011   CL 98 07/26/2011   CREATININE 1.1 07/26/2011   BUN 13 07/26/2011   CO2 28 07/26/2011   TSH 0.57 07/26/2011   PSA 0.53 04/26/2011   HGBA1C 12.8* 07/26/2011      Assessment & Plan:

## 2011-12-25 ENCOUNTER — Encounter: Payer: Self-pay | Admitting: Internal Medicine

## 2011-12-25 LAB — LDL CHOLESTEROL, DIRECT: Direct LDL: 123.7 mg/dL

## 2012-01-23 ENCOUNTER — Ambulatory Visit (INDEPENDENT_AMBULATORY_CARE_PROVIDER_SITE_OTHER): Payer: BC Managed Care – PPO | Admitting: Endocrinology

## 2012-01-23 ENCOUNTER — Encounter: Payer: Self-pay | Admitting: Endocrinology

## 2012-01-23 VITALS — BP 128/80 | HR 92 | Temp 98.6°F | Ht 76.0 in | Wt 317.0 lb

## 2012-01-23 DIAGNOSIS — J45901 Unspecified asthma with (acute) exacerbation: Secondary | ICD-10-CM

## 2012-01-23 DIAGNOSIS — IMO0001 Reserved for inherently not codable concepts without codable children: Secondary | ICD-10-CM

## 2012-01-23 MED ORDER — INSULIN GLARGINE 100 UNIT/ML ~~LOC~~ SOLN: 175.0000 [IU] | Freq: Every day | SUBCUTANEOUS | Status: DC

## 2012-01-23 MED ORDER — PROMETHAZINE-CODEINE 6.25-10 MG/5ML PO SYRP: 5.0000 mL | ORAL_SOLUTION | ORAL | Status: AC | PRN

## 2012-01-23 MED ORDER — FLUTICASONE-SALMETEROL 250-50 MCG/DOSE IN AEPB: 1.0000 | INHALATION_SPRAY | Freq: Two times a day (BID) | RESPIRATORY_TRACT | Status: DC

## 2012-01-23 NOTE — Progress Notes (Signed)
  Subjective:    Patient ID: Mark Oconnor, male    DOB: March 22, 1981, 31 y.o.   MRN: [id number]  HPI Pt states 1 week of slight dry-quality cough in the chest, and assoc doe.  He ran out of his advair. no cbg record, but states cbg's are still high.  He says he takes insulin as rx'ed, except at lunch.  Past Medical History  Diagnosis Date  . Diabetes mellitus   . Hyperlipidemia   . Hypertension     No past surgical history on file.  History   Social History  . Marital Status: Single    Spouse Name: N/A    Number of Children: N/A  . Years of Education: N/A   Occupational History  . Not on file.   Social History Main Topics  . Smoking status: Former Smoker -- 0.5 packs/day for 15 years    Types: Cigarettes    Quit date: 12/07/2011  . Smokeless tobacco: Never Used  . Alcohol Use: No     occasional  . Drug Use: No  . Sexually Active: Yes   Other Topics Concern  . Not on file   Social History Narrative  . No narrative on file    Current Outpatient Prescriptions on File Prior to Visit  Medication Sig Dispense Refill  . albuterol (VENTOLIN HFA) 108 (90 BASE) MCG/ACT inhaler Inhale 2 puffs into the lungs every 6 (six) hours as needed for wheezing.  18 g  0  . Fluticasone-Salmeterol (ADVAIR DISKUS) 250-50 MCG/DOSE AEPB Inhale 1 puff into the lungs 2 (two) times daily.  180 each  0  . insulin glargine (LANTUS) 100 UNIT/ML injection Inject 175 Units into the skin daily.  20 pen  11  . Olmesartan-Amlodipine-HCTZ (TRIBENZOR) 40-5-12.5 MG TABS Take 1 tablet by mouth daily.  90 tablet  3  . ONE TOUCH ULTRA TEST test strip 1 each by Other route.       . rosuvastatin (CRESTOR) 20 MG tablet Take 20 mg by mouth daily.          No Known Allergies  Family History  Problem Relation Age of Onset  . Diabetes Father   . Cancer Neg Hx   . Heart disease Neg Hx   . Hyperlipidemia Neg Hx   . Hypertension Neg Hx    BP 128/80  Pulse 92  Temp 98.6 F (37 C) (Oral)  Ht 6\' 4"  (1.93  m)  Wt 317 lb (143.79 kg)  BMI 38.59 kg/m2  SpO2 97%  Review of Systems denies hypoglycemia and fever    Objective:   Physical Exam VITAL SIGNS:  See vs page GENERAL: no distress LUNGS:  Clear to auscultation   Lab Results  Component Value Date   HGBA1C 13.2* 12/22/2011      Assessment & Plan:  Acute bronchitis, new DM, poor control.  He may do better with a simpler regimen Asthma, therapy limited by noncompliance.  i'll do the best i can.

## 2012-01-23 NOTE — Patient Instructions (Addendum)
Please resume the advair.  i have sent a prescription to your pharmacy.  Also, here are some samples.   Here is a prescription for cough syrup. Increase the lantus to 175 units daily Stop the humalog. Please come back for a follow-up appointment for 1 month. check your blood sugar twice a day.  vary the time of day when you check, between before the 3 meals, and at bedtime.  also check if you have symptoms of your blood sugar being too high or too low.  please keep a record of the readings and bring it to your next appointment here.  please call us sooner if your blood sugar goes below 70, or if you have a lot of readings over 200.

## 2012-02-05 ENCOUNTER — Ambulatory Visit: Payer: BC Managed Care – PPO | Admitting: Internal Medicine

## 2012-02-11 ENCOUNTER — Ambulatory Visit (INDEPENDENT_AMBULATORY_CARE_PROVIDER_SITE_OTHER): Payer: BC Managed Care – PPO | Admitting: Physician Assistant

## 2012-02-11 VITALS — BP 146/90 | HR 60 | Temp 98.1°F | Resp 16 | Ht 75.0 in | Wt 320.0 lb

## 2012-02-11 DIAGNOSIS — M79609 Pain in unspecified limb: Secondary | ICD-10-CM

## 2012-02-11 DIAGNOSIS — M79629 Pain in unspecified upper arm: Secondary | ICD-10-CM

## 2012-02-11 DIAGNOSIS — IMO0002 Reserved for concepts with insufficient information to code with codable children: Secondary | ICD-10-CM

## 2012-02-11 DIAGNOSIS — L03112 Cellulitis of left axilla: Secondary | ICD-10-CM

## 2012-02-11 MED ORDER — DOXYCYCLINE HYCLATE 100 MG PO CAPS: 100.0000 mg | ORAL_CAPSULE | Freq: Two times a day (BID) | ORAL | Status: DC

## 2012-02-11 NOTE — Progress Notes (Signed)
Patient ID: Mark Oconnor MRN: [id number], DOB: 09/22/1980 31 y.o. Date of Encounter: 02/11/2012, 10:58 AM  Chief Complaint: Wound care   See previous note  HPI: 31 y.o. y/o male presents with bump in left axilla.  Symptoms started 3 days ago and have been worsening.  Does admit to tenderness to touch. No spontaneous drainage, warmth, fever, or chills. He does have a history of similar lesions, 3 of which that have needed to be I&D. Last culture showed MRSA.    Past Medical History  Diagnosis Date  . Diabetes mellitus   . Hyperlipidemia   . Hypertension      Home Meds: Prior to Admission medications   Medication Sig Start Date End Date Taking? Authorizing Provider  albuterol (VENTOLIN HFA) 108 (90 BASE) MCG/ACT inhaler Inhale 2 puffs into the lungs every 6 (six) hours as needed for wheezing. 12/22/11 12/21/12 Yes Etta Grandchild, MD  Fluticasone-Salmeterol (ADVAIR DISKUS) 250-50 MCG/DOSE AEPB Inhale 1 puff into the lungs 2 (two) times daily. 01/23/12  Yes Romero Belling, MD  insulin glargine (LANTUS) 100 UNIT/ML injection Inject 175 Units into the skin daily. 01/23/12 01/22/13 Yes Romero Belling, MD  Olmesartan-Amlodipine-HCTZ [facility]) 40-5-12.5 MG TABS Take 1 tablet by mouth daily. 08/27/11  Yes Etta Grandchild, MD  ONE TOUCH ULTRA TEST test strip 1 each by Other route.  05/23/11  Yes Historical Provider, MD  rosuvastatin (CRESTOR) 20 MG tablet Take 20 mg by mouth daily.     Yes Historical Provider, MD  doxycycline (VIBRAMYCIN) 100 MG capsule Take 1 capsule (100 mg total) by mouth 2 (two) times daily. 02/11/12   Nelva Nay, PA-C    Allergies: No Known Allergies  ROS: Constitutional: Afebrile, no chills Cardiovascular: negative for chest pain or palpitations Dermatological: Positive for wound. Positive for erythema and tenderness. No warmth. GI: No nausea or vomiting   EXAM: Physical Exam: Blood pressure 146/90, pulse 60, temperature 98.1 F (36.7 C), temperature source Oral,  resp. rate 16, height 6\' 3"  (1.905 m), weight 320 lb (145.151 kg), SpO2 98.00%., Body mass index is 40.00 kg/(m^2). General: Well developed, well nourished, in no acute distress. Nontoxic appearing. Head: Normocephalic, atraumatic, sclera non-icteric.  Neck: Supple. Lungs: Breathing is unlabored. Heart: Normal rate. Skin:  Warm and moist. Dressing and packing in place. Minimal induration and erythema. +tenderness to palpation. Central area of fluctuance with opening.   Neuro: Alert and oriented X 3. Moves all extremities spontaneously. Normal gait.  Psych:  Responds to questions appropriately with a normal affect.       PROCEDURE: VCO Local anesthesia with 2% lidocaine plain.  1 cm incision with #11 blade along lesion. Minimal purulence expressed along with sebaceous material.  Wound bed healthy Irrigated. Packed with 1/4 plain packing.  Dressing applied   A/P: 31 y.o. y/o male with axillary cellulitis/abscess  Wound care per above Start doxycycline. Pain well controlled Daily dressing changes Recheck 48 hours  Signed, Rhoderick Moody, PA-C 02/11/2012 10:58 AM

## 2012-02-13 ENCOUNTER — Ambulatory Visit (INDEPENDENT_AMBULATORY_CARE_PROVIDER_SITE_OTHER): Payer: BC Managed Care – PPO | Admitting: Physician Assistant

## 2012-02-13 VITALS — BP 114/67 | HR 67 | Temp 99.0°F | Resp 16 | Ht 75.0 in | Wt 315.0 lb

## 2012-02-13 DIAGNOSIS — M79629 Pain in unspecified upper arm: Secondary | ICD-10-CM

## 2012-02-13 DIAGNOSIS — M79609 Pain in unspecified limb: Secondary | ICD-10-CM

## 2012-02-13 DIAGNOSIS — L03119 Cellulitis of unspecified part of limb: Secondary | ICD-10-CM

## 2012-02-13 DIAGNOSIS — IMO0002 Reserved for concepts with insufficient information to code with codable children: Secondary | ICD-10-CM

## 2012-02-13 LAB — POCT CBC
Granulocyte percent: 62.9 %G (ref 37–80)
HCT, POC: 51.4 % (ref 43.5–53.7)
Hemoglobin: 16.2 g/dL (ref 14.1–18.1)
Lymph, poc: 2.9 (ref 0.6–3.4)
MCH, POC: 25.5 pg — AB (ref 27–31.2)
MCHC: 31.5 g/dL — AB (ref 31.8–35.4)
MCV: 80.8 fL (ref 80–97)
MID (cbc): 0.6 (ref 0–0.9)
MPV: 10.5 fL (ref 0–99.8)
POC Granulocyte: 5.9 (ref 2–6.9)
POC LYMPH PERCENT: 30.7 %L (ref 10–50)
POC MID %: 6.4 %M (ref 0–12)
Platelet Count, POC: 345 10*3/uL (ref 142–424)
RBC: 6.36 M/uL — AB (ref 4.69–6.13)
RDW, POC: 14.3 %
WBC: 9.4 10*3/uL (ref 4.6–10.2)

## 2012-02-13 MED ORDER — CEFTRIAXONE SODIUM 1 G IJ SOLR
1.0000 g | INTRAMUSCULAR | Status: DC
  Administered 2012-02-13: 1 g via INTRAMUSCULAR

## 2012-02-13 MED ORDER — HYDROCODONE-ACETAMINOPHEN 5-500 MG PO TABS: 1.0000 | ORAL_TABLET | Freq: Three times a day (TID) | ORAL | Status: DC | PRN

## 2012-02-13 NOTE — Progress Notes (Signed)
Patient ID: Mark Oconnor MRN: [id number], DOB: [birth year] 31 y.o. Date of Encounter: 02/13/2012, 7:28 PM  Chief Complaint: Wound care   See previous note  HPI: 31 y.o. y/o male presents for wound care s/p I&D on 02/11/12 Doing well No issues or complaints Afebrile/ no chills No nausea or vomiting Tolerating doxycycline. Complains of pain.  Daily dressing change Previous note reviewed  Past Medical History  Diagnosis Date  . Diabetes mellitus   . Hyperlipidemia   . Hypertension      Home Meds: Prior to Admission medications   Medication Sig Start Date End Date Taking? Authorizing Provider  albuterol (VENTOLIN HFA) 108 (90 BASE) MCG/ACT inhaler Inhale 2 puffs into the lungs every 6 (six) hours as needed for wheezing. 12/22/11 12/21/12 Yes Etta Grandchild, MD  doxycycline (VIBRAMYCIN) 100 MG capsule Take 1 capsule (100 mg total) by mouth 2 (two) times daily. 02/11/12  Yes Tamre Cass M Kura Bethards, PA-C  Fluticasone-Salmeterol (ADVAIR DISKUS) 250-50 MCG/DOSE AEPB Inhale 1 puff into the lungs 2 (two) times daily. 01/23/12  Yes Romero Belling, MD  insulin glargine (LANTUS) 100 UNIT/ML injection Inject 175 Units into the skin daily. 01/23/12 01/22/13 Yes Romero Belling, MD  Olmesartan-Amlodipine-HCTZ [facility]) 40-5-12.5 MG TABS Take 1 tablet by mouth daily. 08/27/11  Yes Etta Grandchild, MD  ONE TOUCH ULTRA TEST test strip 1 each by Other route.  05/23/11  Yes Historical Provider, MD  rosuvastatin (CRESTOR) 20 MG tablet Take 20 mg by mouth daily.     Yes Historical Provider, MD  HYDROcodone-acetaminophen (VICODIN) 5-500 MG per tablet Take 1 tablet by mouth every 8 (eight) hours as needed for pain. 02/13/12   Nelva Nay, PA-C    Allergies: No Known Allergies  ROS: Constitutional: Afebrile, no chills Cardiovascular: negative for chest pain or palpitations Dermatological: Positive for wound. Positive for warmth and slight induration. Admits to pain. GI: No nausea or vomiting   EXAM: Physical  Exam: Blood pressure 114/67, pulse 67, temperature 99 F (37.2 C), temperature source Oral, resp. rate 16, height 6\' 3"  (1.905 m), weight 315 lb (142.883 kg), SpO2 98.00%., Body mass index is 39.37 kg/(m^2). General: Well developed, well nourished, in no acute distress. Nontoxic appearing. Head: Normocephalic, atraumatic, sclera non-icteric.  Neck: Supple. Lungs: Breathing is unlabored. Heart: Normal rate. Skin:  Warm and moist. Dressing and packing in place. slight induration, erythema, or tenderness to palpation. Neuro: Alert and oriented X 3. Moves all extremities spontaneously. Normal gait.  Psych:  Responds to questions appropriately with a normal affect.       PROCEDURE: Dressing and packing removed. Minimal purulence expressed. Copious amounts of drainage on dressing and packing. Sebaceous material expressed.  Wound bed healthy Irrigated with 2% plain lidocaine 5 cc. Repacked with 1/4 plain packing. Dressing applied  LAB: Results for orders placed in visit on 02/13/12  POCT CBC      Component Value Range   WBC 9.4  4.6 - 10.2 K/uL   Lymph, poc 2.9  0.6 - 3.4   POC LYMPH PERCENT 30.7  10 - 50 %L   MID (cbc) 0.6  0 - 0.9   POC MID % 6.4  0 - 12 %M   POC Granulocyte 5.9  2 - 6.9   Granulocyte percent 62.9  37 - 80 %G   RBC 6.36 (*) 4.69 - 6.13 M/uL   Hemoglobin 16.2  14.1 - 18.1 g/dL   HCT, POC 40.9  81.1 - 53.7 %   MCV 80.8  80 -  97 fL   MCH, POC 25.5 (*) 27 - 31.2 pg   MCHC 31.5 (*) 31.8 - 35.4 g/dL   RDW, POC 40.9     Platelet Count, POC 345  142 - 424 K/uL   MPV 10.5  0 - 99.8 fL     A/P: 31 y.o. y/o male with axillary cellulitis/abscess as above s/p I&D on Wound care per above Continue doxycycline.  Rocephin 1 gram given today Pain well controlled Daily dressing changes Recheck 48 hours  Signed, Rhoderick Moody, PA-C 02/13/2012 7:28 PM

## 2012-02-14 LAB — WOUND CULTURE: Gram Stain: NONE SEEN

## 2012-02-15 ENCOUNTER — Ambulatory Visit (INDEPENDENT_AMBULATORY_CARE_PROVIDER_SITE_OTHER): Payer: BC Managed Care – PPO | Admitting: Physician Assistant

## 2012-02-15 VITALS — BP 116/77 | HR 82 | Temp 98.5°F | Resp 18 | Ht 74.0 in | Wt 315.2 lb

## 2012-02-15 DIAGNOSIS — L03119 Cellulitis of unspecified part of limb: Secondary | ICD-10-CM

## 2012-02-15 DIAGNOSIS — IMO0002 Reserved for concepts with insufficient information to code with codable children: Secondary | ICD-10-CM

## 2012-02-15 NOTE — Progress Notes (Signed)
Patient ID: WINNER COURTWRIGHT MRN: [id number], DOB: [birth year] 31 y.o. Date of Encounter: 02/15/2012, 2:01 PM  Chief Complaint: Wound care   See previous note  HPI: 31 y.o. y/o male presents for wound care s/p I&D on 02/11/12 Doing well No issues or complaints Packing fell out yesterday Afebrile/ no chills No nausea or vomiting Tolerating doxycycline. Pain resolved. Daily dressing change Previous note reviewed  Past Medical History  Diagnosis Date  . Diabetes mellitus   . Hyperlipidemia   . Hypertension      Home Meds: Prior to Admission medications   Medication Sig Start Date End Date Taking? Authorizing Provider  albuterol (VENTOLIN HFA) 108 (90 BASE) MCG/ACT inhaler Inhale 2 puffs into the lungs every 6 (six) hours as needed for wheezing. 12/22/11 12/21/12 Yes Etta Grandchild, MD  doxycycline (VIBRAMYCIN) 100 MG capsule Take 1 capsule (100 mg total) by mouth 2 (two) times daily. 02/11/12  Yes Demitria Hay M Tannie Koskela, PA-C  Fluticasone-Salmeterol (ADVAIR DISKUS) 250-50 MCG/DOSE AEPB Inhale 1 puff into the lungs 2 (two) times daily. 01/23/12  Yes Romero Belling, MD  HYDROcodone-acetaminophen (VICODIN) 5-500 MG per tablet Take 1 tablet by mouth every 8 (eight) hours as needed for pain. 02/13/12  Yes Saanvi Hakala M Nyron Mozer, PA-C  insulin glargine (LANTUS) 100 UNIT/ML injection Inject 175 Units into the skin daily. 01/23/12 01/22/13 Yes Romero Belling, MD  Olmesartan-Amlodipine-HCTZ [facility]) 40-5-12.5 MG TABS Take 1 tablet by mouth daily. 08/27/11  Yes Etta Grandchild, MD  ONE TOUCH ULTRA TEST test strip 1 each by Other route.  05/23/11  Yes Historical Provider, MD  rosuvastatin (CRESTOR) 20 MG tablet Take 20 mg by mouth daily.     Yes Historical Provider, MD    Allergies: No Known Allergies  ROS: Constitutional: Afebrile, no chills Cardiovascular: negative for chest pain or palpitations Dermatological: Positive for wound. Negative for erythema, pain, or warmth.  GI: No nausea or vomiting    EXAM: Physical Exam: Blood pressure 116/77, pulse 82, temperature 98.5 F (36.9 C), temperature source Oral, resp. rate 18, height 6\' 2"  (1.88 m), weight 315 lb 3.2 oz (142.974 kg), SpO2 98.00%., Body mass index is 40.47 kg/(m^2). General: Well developed, well nourished, in no acute distress. Nontoxic appearing. Head: Normocephalic, atraumatic, sclera non-icteric.  Neck: Supple. Lungs: Breathing is unlabored. Heart: Normal rate. Skin:  Warm and moist. Dressing and packing in place. Minimal induration. No erythema or tenderness to palpation. Neuro: Alert and oriented X 3. Moves all extremities spontaneously. Normal gait.  Psych:  Responds to questions appropriately with a normal affect.       PROCEDURE: Dressing removed. No packing in wound.  No purulence expressed. Sebaceous material debrided.  Wound bed healthy Irrigated with 2% plain lidocaine 5 cc. Repacked with 1/4 plain packing.  Dressing applied  LAB: Culture:   A/P: 31 y.o. y/o male with axillary cellulitis/abscess as above s/p I&D on Wound care per above Continue doxycycline. Pain well controlled Daily dressing changes Recheck 48 hours  Signed, Rhoderick Moody, PA-C 02/15/2012 2:01 PM

## 2012-02-16 ENCOUNTER — Ambulatory Visit: Payer: BC Managed Care – PPO | Admitting: Internal Medicine

## 2012-02-18 ENCOUNTER — Ambulatory Visit (INDEPENDENT_AMBULATORY_CARE_PROVIDER_SITE_OTHER): Payer: BC Managed Care – PPO | Admitting: Physician Assistant

## 2012-02-18 VITALS — BP 122/82 | HR 82 | Temp 98.1°F | Resp 16 | Wt 324.0 lb

## 2012-02-18 DIAGNOSIS — L03112 Cellulitis of left axilla: Secondary | ICD-10-CM

## 2012-02-18 DIAGNOSIS — IMO0002 Reserved for concepts with insufficient information to code with codable children: Secondary | ICD-10-CM

## 2012-02-18 NOTE — Progress Notes (Signed)
   [address] [place] [address]   Phone [phone]  Subjective:    Patient ID: Mark Oconnor, male    DOB: 1981/03/08, 31 y.o.   MRN: [id number]  HPI Pt presents to clinic for wound recheck.  He is doing well. Less pain.  Tolerating abx ok.  He has noticed no change in his sugar readings since this abscess occurred.  He did use deodorant this am. Packing has fallen out yesterday.    Review of Systems  Constitutional: Negative for fever and chills.  Gastrointestinal: Negative for nausea.       Objective:   Physical Exam  Vitals reviewed. Constitutional: He is oriented to person, place, and time. He appears well-developed and well-nourished.  HENT:  Head: Normocephalic and atraumatic.  Right Ear: External ear normal.  Left Ear: External ear normal.  Eyes: Conjunctivae normal are normal.  Pulmonary/Chest: Effort normal.  Neurological: He is alert and oriented to person, place, and time.  Skin: Skin is warm and dry.       Drsg and packing are removed when I go into the room.  Good granulation tissue filling wound.  No surrounding erythema.  Some induration inferior to wound.  No TTP.  No need for packing.  Psychiatric: He has a normal mood and affect. His behavior is normal. Judgment and thought content normal.       Assessment & Plan:   1. Cellulitis of axilla, left    No f/u needed.  Finish abx.  Continue to monitor glucose.

## 2012-03-03 ENCOUNTER — Other Ambulatory Visit: Payer: Self-pay | Admitting: Internal Medicine

## 2012-03-19 ENCOUNTER — Ambulatory Visit: Payer: BC Managed Care – PPO | Admitting: Internal Medicine

## 2012-03-22 ENCOUNTER — Encounter: Payer: Self-pay | Admitting: Endocrinology

## 2012-03-22 ENCOUNTER — Ambulatory Visit (INDEPENDENT_AMBULATORY_CARE_PROVIDER_SITE_OTHER): Payer: BC Managed Care – PPO | Admitting: Endocrinology

## 2012-03-22 VITALS — BP 130/80 | HR 80 | Temp 98.6°F | Wt 320.0 lb

## 2012-03-22 DIAGNOSIS — IMO0001 Reserved for inherently not codable concepts without codable children: Secondary | ICD-10-CM

## 2012-03-22 DIAGNOSIS — I10 Essential (primary) hypertension: Secondary | ICD-10-CM

## 2012-03-22 MED ORDER — OLMESARTAN-AMLODIPINE-HCTZ 40-5-12.5 MG PO TABS: 1.0000 | ORAL_TABLET | Freq: Every day | ORAL | Status: DC

## 2012-03-22 NOTE — Patient Instructions (Addendum)
reduce the lantus to 140 units daily You should take "victoza" pen, once a day.  The side-effect is nausea, which goes away with time.  To avoid this side-effect, start with the lowest (0.6) setting.  After a few days, increase to 1.2.  If you still have little or no nausea, increase to the highest (1.8) setting, and continue that setting.  Here is a discount card.   Please come back for a follow-up appointment in 1 month.  check your blood sugar twice a day.  vary the time of day when you check, between before the 3 meals, and at bedtime.  also check if you have symptoms of your blood sugar being too high or too low.  please keep a record of the readings and bring it to your next appointment here.  please call us sooner if your blood sugar goes below 70, or if you have a lot of readings over 200.

## 2012-03-22 NOTE — Progress Notes (Signed)
  Subjective:    Patient ID: Mark Oconnor, male    DOB: 1980/11/04, 31 y.o.   MRN: [id number]  HPI Pt returns for f/u of insulin-requiring DM (dx'ed 2011; no known complications).  He says cbg's were overcontrolled on lantus 175 units qd, but he gained weight.  He reduced it to 60 units qd, adn prn humalog.  On this, weight was better, but cbg's were elevated.  He wants to try lantus + victoza, for purposes of his weight. Past Medical History  Diagnosis Date  . Diabetes mellitus   . Hyperlipidemia   . Hypertension     No past surgical history on file.  History   Social History  . Marital Status: Single    Spouse Name: N/A    Number of Children: N/A  . Years of Education: N/A   Occupational History  . Not on file.   Social History Main Topics  . Smoking status: Former Smoker -- 0.5 packs/day for 15 years    Types: Cigarettes    Quit date: 12/07/2011  . Smokeless tobacco: Never Used  . Alcohol Use: No     Comment: occasional  . Drug Use: No  . Sexually Active: Yes   Other Topics Concern  . Not on file   Social History Narrative  . No narrative on file    Current Outpatient Prescriptions on File Prior to Visit  Medication Sig Dispense Refill  . albuterol (VENTOLIN HFA) 108 (90 BASE) MCG/ACT inhaler Inhale 2 puffs into the lungs every 6 (six) hours as needed for wheezing.  18 g  0  . doxycycline (VIBRAMYCIN) 100 MG capsule Take 1 capsule (100 mg total) by mouth 2 (two) times daily.  20 capsule  0  . Fluticasone-Salmeterol (ADVAIR DISKUS) 250-50 MCG/DOSE AEPB Inhale 1 puff into the lungs 2 (two) times daily.  180 each  0  . HYDROcodone-acetaminophen (VICODIN) 5-500 MG per tablet Take 1 tablet by mouth every 8 (eight) hours as needed for pain.  20 tablet  0  . insulin glargine (LANTUS) 100 UNIT/ML injection Inject 140 Units into the skin daily.      . Liraglutide (VICTOZA) 18 MG/3ML SOLN Inject 1.8 mg into the skin daily.      . Olmesartan-Amlodipine-HCTZ (TRIBENZOR)  40-5-12.5 MG TABS Take 1 tablet by mouth daily.  90 tablet  3  . ONE TOUCH ULTRA TEST test strip USE TO TEST BLOOD SUGAR TWICE DAILY  150 each  11  . rosuvastatin (CRESTOR) 20 MG tablet Take 20 mg by mouth daily.          No Known Allergies  Family History  Problem Relation Age of Onset  . Diabetes Father   . Cancer Neg Hx   . Heart disease Neg Hx   . Hyperlipidemia Neg Hx   . Hypertension Neg Hx     BP 130/80  Pulse 80  Temp 98.6 F (37 C) (Oral)  Wt 320 lb (145.151 kg)  SpO2 98%    Review of Systems Denies numbness.      Objective:   Physical Exam VITAL SIGNS:  See vs page GENERAL: no distress PSYCH: Alert and oriented x 3.  Does not appear anxious nor depressed.      Assessment & Plan:  DM, therapy limited by dietary noncompliance.  i'll do the best i can.

## 2012-04-22 ENCOUNTER — Ambulatory Visit (INDEPENDENT_AMBULATORY_CARE_PROVIDER_SITE_OTHER): Payer: BC Managed Care – PPO | Admitting: Endocrinology

## 2012-04-22 VITALS — BP 126/74 | HR 85 | Temp 98.6°F | Wt 328.0 lb

## 2012-04-22 DIAGNOSIS — IMO0001 Reserved for inherently not codable concepts without codable children: Secondary | ICD-10-CM

## 2012-04-22 DIAGNOSIS — L299 Pruritus, unspecified: Secondary | ICD-10-CM | POA: Insufficient documentation

## 2012-04-22 NOTE — Progress Notes (Signed)
Subjective:    Patient ID: Mark Oconnor, male    DOB: July 14, 1980, 31 y.o.   MRN: [id number]  HPI Pt returns for f/u of insulin-requiring DM (dx'ed 2011; no known complications).  He says cbg's were overcontrolled on lantus 175 units qd, but he gained weight.  He is on lantus + victoza, at his request, for purposes of his weight.  He takes a total of 30 units of humalog per day.  He says this causes frequent hypoglycemia.  He says the lantus and victoza alone was not keeping the insulin below 200.   He reports moderate itching throughout the body, in the context of working, and assoc sweating. Past Medical History  Diagnosis Date  . Diabetes mellitus   . Hyperlipidemia   . Hypertension     No past surgical history on file.  History   Social History  . Marital Status: Single    Spouse Name: N/A    Number of Children: N/A  . Years of Education: N/A   Occupational History  . Not on file.   Social History Main Topics  . Smoking status: Former Smoker -- 0.5 packs/day for 15 years    Types: Cigarettes    Quit date: 12/07/2011  . Smokeless tobacco: Never Used  . Alcohol Use: No     Comment: occasional  . Drug Use: No  . Sexually Active: Yes   Other Topics Concern  . Not on file   Social History Narrative  . No narrative on file    Current Outpatient Prescriptions on File Prior to Visit  Medication Sig Dispense Refill  . albuterol (VENTOLIN HFA) 108 (90 BASE) MCG/ACT inhaler Inhale 2 puffs into the lungs every 6 (six) hours as needed for wheezing.  18 g  0  . doxycycline (VIBRAMYCIN) 100 MG capsule Take 1 capsule (100 mg total) by mouth 2 (two) times daily.  20 capsule  0  . Fluticasone-Salmeterol (ADVAIR DISKUS) 250-50 MCG/DOSE AEPB Inhale 1 puff into the lungs 2 (two) times daily.  180 each  0  . HYDROcodone-acetaminophen (VICODIN) 5-500 MG per tablet Take 1 tablet by mouth every 8 (eight) hours as needed for pain.  20 tablet  0  . insulin glargine (LANTUS) 100 UNIT/ML  injection Inject 160 Units into the skin daily.       . Liraglutide (VICTOZA) 18 MG/3ML SOLN Inject 1.8 mg into the skin daily.      . Olmesartan-Amlodipine-HCTZ (TRIBENZOR) 40-5-12.5 MG TABS Take 1 tablet by mouth daily.  90 tablet  3  . ONE TOUCH ULTRA TEST test strip USE TO TEST BLOOD SUGAR TWICE DAILY  150 each  11  . rosuvastatin (CRESTOR) 20 MG tablet Take 20 mg by mouth daily.          No Known Allergies  Family History  Problem Relation Age of Onset  . Diabetes Father   . Cancer Neg Hx   . Heart disease Neg Hx   . Hyperlipidemia Neg Hx   . Hypertension Neg Hx     BP 126/74  Pulse 85  Temp 98.6 F (37 C) (Oral)  Wt 328 lb (148.78 kg)  SpO2 94%  Review of Systems Denies LOC, rash, and fever.    Objective:   Physical Exam VITAL SIGNS:  See vs page GENERAL: no distress Pulses: dorsalis pedis intact bilat.   Feet: no deformity.  no ulcer on the feet.  feet are of normal color and temp.  no edema.   Neuro: sensation  is intact to touch on the feet.   Skin: no rash seen.  Lab Results  Component Value Date   HGBA1C 13.2* 04/22/2012      Assessment & Plan:  DM, poor control.  This is usually due to noncompliance.  The best i can do is to maximally simplify his regimen.   Pruritis, new, uncertain etiology

## 2012-04-22 NOTE — Patient Instructions (Addendum)
Please increase the lantus to 160 units daily.   Please avoid the humalog altogether. Please continue the same victoza  Please come back for a follow-up appointment in 1 month.  check your blood sugar twice a day.  vary the time of day when you check, between before the 3 meals, and at bedtime.  also check if you have symptoms of your blood sugar being too high or too low.  please keep a record of the readings and bring it to your next appointment here.  please call us sooner if your blood sugar goes below 70, or if you have a lot of readings over 200.   Refer to a dermatology specialist.  you will receive a phone call, about a day and time for an appointment

## 2012-04-23 LAB — HEMOGLOBIN A1C
Hgb A1c MFr Bld: 13.2 % — ABNORMAL HIGH
Mean Plasma Glucose: 332 mg/dL — ABNORMAL HIGH

## 2012-05-02 ENCOUNTER — Ambulatory Visit: Payer: Self-pay | Admitting: Internal Medicine

## 2012-06-07 ENCOUNTER — Ambulatory Visit (INDEPENDENT_AMBULATORY_CARE_PROVIDER_SITE_OTHER): Payer: BC Managed Care – PPO | Admitting: Internal Medicine

## 2012-06-07 ENCOUNTER — Ambulatory Visit (INDEPENDENT_AMBULATORY_CARE_PROVIDER_SITE_OTHER): Payer: BC Managed Care – PPO | Admitting: General Surgery

## 2012-06-07 ENCOUNTER — Encounter: Payer: Self-pay | Admitting: Internal Medicine

## 2012-06-07 ENCOUNTER — Encounter (INDEPENDENT_AMBULATORY_CARE_PROVIDER_SITE_OTHER): Payer: Self-pay | Admitting: General Surgery

## 2012-06-07 VITALS — BP 138/88 | HR 83 | Temp 99.0°F | Resp 18 | Ht 75.0 in | Wt 325.8 lb

## 2012-06-07 VITALS — BP 140/88 | HR 92 | Ht 76.0 in | Wt 331.6 lb

## 2012-06-07 DIAGNOSIS — L299 Pruritus, unspecified: Secondary | ICD-10-CM

## 2012-06-07 DIAGNOSIS — K219 Gastro-esophageal reflux disease without esophagitis: Secondary | ICD-10-CM

## 2012-06-07 DIAGNOSIS — Z6841 Body Mass Index (BMI) 40.0 and over, adult: Secondary | ICD-10-CM

## 2012-06-07 DIAGNOSIS — J45909 Unspecified asthma, uncomplicated: Secondary | ICD-10-CM

## 2012-06-07 DIAGNOSIS — G4733 Obstructive sleep apnea (adult) (pediatric): Secondary | ICD-10-CM

## 2012-06-07 DIAGNOSIS — IMO0001 Reserved for inherently not codable concepts without codable children: Secondary | ICD-10-CM

## 2012-06-07 LAB — LIPID PANEL
Cholesterol: 279 mg/dL — ABNORMAL HIGH (ref 0–200)
HDL: 42 mg/dL (ref >39)
Total CHOL/HDL Ratio: 6.6 Ratio
VLDL: 35 mg/dL (ref 0–40)

## 2012-06-07 LAB — COMPREHENSIVE METABOLIC PANEL
ALT: 14 U/L (ref 0–53)
AST: 12 U/L (ref 0–37)
CO2: 24 mEq/L (ref 19–32)
Chloride: 103 mEq/L (ref 96–112)
Sodium: 142 mEq/L (ref 135–145)
Total Bilirubin: 0.3 mg/dL (ref 0.3–1.2)
Total Protein: 7.7 g/dL (ref 6.0–8.3)

## 2012-06-07 LAB — HEMOGLOBIN A1C: Mean Plasma Glucose: 346 mg/dL — ABNORMAL HIGH (ref <117)

## 2012-06-07 LAB — CBC WITH DIFFERENTIAL/PLATELET
Basophils Absolute: 0 10*3/uL (ref 0.0–0.1)
Eosinophils Absolute: 0.1 10*3/uL (ref 0.0–0.7)
Lymphocytes Relative: 20 % (ref 12–46)
Lymphs Abs: 1.6 10*3/uL (ref 0.7–4.0)
Neutrophils Relative %: 72 % (ref 43–77)
Platelets: 273 10*3/uL (ref 150–400)
RBC: 6.42 MIL/uL — ABNORMAL HIGH (ref 4.22–5.81)
RDW: 14.4 % (ref 11.5–15.5)
WBC: 8 10*3/uL (ref 4.0–10.5)

## 2012-06-07 LAB — TSH: TSH: 0.578 u[IU]/mL (ref 0.350–4.500)

## 2012-06-07 MED ORDER — MONTELUKAST SODIUM 10 MG PO TABS: 10.0000 mg | ORAL_TABLET | Freq: Every day | ORAL | Status: DC

## 2012-06-07 NOTE — Progress Notes (Signed)
Subjective:   obesity, diabetes  Patient ID: Mark Oconnor, male   DOB: Aug 29, 1980, 32 y.o.   MRN: [id number]  HPI Patient is a very pleasant 32 year old male, referred by Dr. Everardo All, for consideration for surgical treatment for his morbid obesity. The patient states that he is troubled with his weight essentially all of his life. He has been as high as over 400 pounds. About 3 years ago he was diagnosed with diabetes and has been insulin-dependent since then. He currently takes about 140 units of insulin per day. He was able to get his weight down to about 280 but quit smoking about 2 years ago and has seen his weight steadily climb. This is despite multiple efforts at nonsurgical weight loss. He is very concerned particularly with his diabetes and potential complications from his colon for that his current weight. He also has other comorbidities as detailed below. He has been to our initial information seminar and is interested in gastric bypass due to nonspecific effect on diabetes.  Past Medical History  Diagnosis Date  . Diabetes mellitus   . Hyperlipidemia   . Hypertension    No past surgical history on file. Current Outpatient Prescriptions  Medication Sig Dispense Refill  . albuterol (VENTOLIN HFA) 108 (90 BASE) MCG/ACT inhaler Inhale 2 puffs into the lungs every 6 (six) hours as needed for wheezing.  18 g  0  . Fluticasone-Salmeterol (ADVAIR DISKUS) 250-50 MCG/DOSE AEPB Inhale 1 puff into the lungs 2 (two) times daily.  180 each  0  . hydrOXYzine (ATARAX/VISTARIL) 10 MG tablet       . insulin glargine (LANTUS) 100 UNIT/ML injection Inject 160 Units into the skin daily.       . Liraglutide (VICTOZA) 18 MG/3ML SOLN Inject 1.8 mg into the skin daily.      . Olmesartan-Amlodipine-HCTZ (TRIBENZOR) 40-5-12.5 MG TABS Take 1 tablet by mouth daily.  90 tablet  3  . ONE TOUCH ULTRA TEST test strip USE TO TEST BLOOD SUGAR TWICE DAILY  150 each  11  . rosuvastatin (CRESTOR) 20 MG tablet Take 20  mg by mouth daily.        Marland Kitchen doxycycline (VIBRAMYCIN) 100 MG capsule Take 1 capsule (100 mg total) by mouth 2 (two) times daily.  20 capsule  0  . HYDROcodone-acetaminophen (VICODIN) 5-500 MG per tablet Take 1 tablet by mouth every 8 (eight) hours as needed for pain.  20 tablet  0   No Known Allergies History  Substance Use Topics  . Smoking status: Former Smoker -- 0.5 packs/day for 15 years    Types: Cigarettes    Quit date: 12/07/2011  . Smokeless tobacco: Never Used  . Alcohol Use: No     Comment: occasional     Review of Systems  Constitutional: Negative.   HENT: Negative.   Eyes: Positive for visual disturbance.  Respiratory: Negative.   Cardiovascular: Negative.   Gastrointestinal: Negative.        History of hepatitis B  Genitourinary: Negative.        ED  Musculoskeletal: Positive for arthralgias.  Neurological: Negative.   Psychiatric/Behavioral: Negative.        Objective:   Physical Exam BP 138/88  Pulse 83  Temp 99 F (37.2 C)  Resp 18  Ht 6\' 3"  (1.905 m)  Wt 325 lb 12.8 oz (147.782 kg)  BMI 40.72 kg/m2 General: Alert, obese African American male, in no distress Skin: Warm and dry with mild hyperpigmented rash over her trunk  and extremities HEENT: No palpable masses or thyromegaly. Sclera nonicteric. Pupils equal round and reactive. Oropharynx clear. Lymph nodes: No cervical, supraclavicular, or inguinal nodes palpable. Lungs: Breath sounds clear and equal without increased work of breathing Cardiovascular: Regular rate and rhythm without murmur. No JVD or edema. Peripheral pulses intact. Abdomen: Nondistended. Soft and nontender. No masses palpable. No organomegaly. No palpable hernias. Extremities: No edema or joint swelling or deformity. No chronic venous stasis changes. Neurologic: Alert and fully oriented. Gait normal.     Assessment:     .Patient with progressive morbid obesity unresponsive to multiple efforts at medical management who presents  with a BMI of 41 and comorbidities of insulin-dependent diabetes mellitus, hypertension and GERD.. I believe there would be very significant medical benefit from surgical weight loss. After our discussion of surgical options currently available the patient has decided to proceed with laparoscopic Roux-en-Y gastric bypass due to the reasons above. We have discussed the nature of the problem and the risks of remaining morbidly obese. We discussed laparoscopic Roux-en-Y gastric bypass in detail including the nature of the procedure, expected hospitalization and recovery, and major risks of anesthetic complications, bleeding, blood clots and pulmonary emboli, leakage and infection and long-term risks of stricture, ulceration, bowel obstruction, nutritional deficiencies, and failure to lose weight or weight regain. We discussed these problems could lead to death. The patient was given a complete consent form to review and all questions were answered. We will go ahead with preoperative including psychological and nutrition evaluations, H. pylori testing, ultrasound, bone density, and routine lab and x-rays. I will see the patient back following these studies.     Plan:     As above

## 2012-06-07 NOTE — Patient Instructions (Addendum)
Try being off medicines containing hydrochlorothiazide. Discuss this with Dr Yetta Barre before stopping.  Script to try singulair  Order- schedule NPSG split protocol   Dx OSA

## 2012-06-07 NOTE — Progress Notes (Signed)
09/26/11- 31 yoM smoker, diabetic, referred courtesy of Dr Dr Sanda Linger for allergy evaluation because of itching. For several years as an adult, he has associated generalized itching with situations when he gets heated. He has a long-standing history of mild eczema but says this problem is different. It has been most pronounced in the past year and mostly involves his upper body without visible rash. He also begins to itch with hard laughter. Itching is relieved by cooling off.  He has done better since the cortisone injection one month ago. He estimates episodes happening about once a week and lasting a few hours to half a day before fading. He says his mother is similar but she blames her cholesterol medication. History of mild seasonal allergic rhinitis without asthma. Since elementary school he has had patches of hyperpigmented skin. A dermatologist years ago called "skin blotch". He has used Oil of [place] and [place] with uncertain benefit. Medical problems include hypertension, insulin-dependent diabetes, elevated cholesterol. He denies intolerance to latex, contrast dye or aspirin. Positive for hepatitis B. 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  12/15/11- 31 yoM smoker, diabetic, referred courtesy of Dr Dr Sanda Linger for allergy evaluation because of itching.  c/o runny nose, itchy eyes, and red eyes. States had bronchitis x 2 weeks ago.  Allergy Profile- 09/26/2011-total IgE 103.6, specific elevations for almost everything on the panel. Daily Claritin blocks skin itching which is much better, still with no visible rash. Now has a cold/bronchitis starting 2 weeks ago and improved to residual dry cough. Some itching of eyes and nose. Watery rhinorrhea. Benadryl at bedtime helps sleep.  06/07/12- 31 yoM former smoker, diabetic, referred courtesy of Dr Dr Sanda Linger for allergy evaluation because of  itching. Follows for: pt states still iching . .thinks it could be due to heat. pt has no breathing issues.  Daily Claritin did help. Dermatologist diagnosed eczema and treated with 2 medications he says were no better than Claritin. We discussed role of insulin/neuropathy, and possibility of allergy to insulin. He tried off Crestor for one month with no change. History of hepatitis B. Reports smoking cessation August 2013. Snores loudly and wakes gasping. Some daytime sleepiness. No ENT surgery. Mother has sleep apnea.  ROS-see HPI Constitutional:   No-   weight loss, night sweats, fevers, chills, fatigue, lassitude. HEENT:   No-  headaches, difficulty swallowing, tooth/dental problems, sore throat,       +-  sneezing, ear ache, nasal congestion, +post nasal drip,  CV:  No-   chest pain, orthopnea, PND, swelling in lower extremities, anasarca, dizziness, palpitations Resp: No-   shortness of breath with exertion or at rest.              No-   productive cough, + non-productive cough,  No- coughing up of blood.              No-   change in color of mucus.  No- wheezing.   Skin: Per HPI GI:  No-   heartburn, indigestion, abdominal pain, nausea, vomiting,  GU: . MS:  No-   joint pain or swelling.  . Neuro-     nothing unusual Psych:  No- change in mood or affect. No depression or anxiety.  No memory loss.  OBJ- Physical Exam General- Alert, Oriented, Affect-appropriate, Distress- none acute, obese Skin- no visible rash or excoriation. Chronic birthmark left arm and chest (  not tinea). Lymphadenopathy- none Head- atraumatic            Eyes- Gross vision intact, PERRLA, +conjunctivae  mildly injected            Ears- Hearing, canals-normal            Nose- + mucus crusting, no-Septal dev,  polyps, erosion, perforation             Throat- Mallampati III-IV , mucosa clear , drainage- none, tonsils- atrophic Neck- flexible , trachea midline, no stridor , thyroid nl, carotid no bruit Chest -  symmetrical excursion , unlabored           Heart/CV- RRR , no murmur , no gallop  , no rub, nl s1 s2                           - JVD- none , edema- none, stasis changes- none, varices- none           Lung- clear to P&A, wheeze- none, cough- none , dullness-none, rub- none           Chest wall-  Abd-  Br/ Gen/ Rectal- Not done, not indicated Extrem- cyanosis- none, clubbing, none, atrophy- none, strength- nl Neuro- grossly intact to observation

## 2012-06-15 DIAGNOSIS — G4733 Obstructive sleep apnea (adult) (pediatric): Secondary | ICD-10-CM | POA: Insufficient documentation

## 2012-06-15 NOTE — Assessment & Plan Note (Signed)
Discussed the importance weight control in managing sleep disordered breathing.

## 2012-06-15 NOTE — Assessment & Plan Note (Signed)
Can continue Claritin if that works best. Try adding Singulair. Try omitting HCTZ from blood pressure coverage for one month to make sure this is not a medication allergy to HCTZ. He will discuss that with his blood pressure Dr.

## 2012-06-15 NOTE — Assessment & Plan Note (Signed)
Snoring, witnessed apneas, and daytime sleepiness. Plan-schedule sleep study as discussed with him

## 2012-06-21 ENCOUNTER — Ambulatory Visit (HOSPITAL_COMMUNITY): Payer: BC Managed Care – PPO

## 2012-06-21 ENCOUNTER — Ambulatory Visit (HOSPITAL_BASED_OUTPATIENT_CLINIC_OR_DEPARTMENT_OTHER): Payer: BC Managed Care – PPO | Attending: Internal Medicine | Admitting: Radiology

## 2012-06-21 VITALS — Ht 75.0 in | Wt 325.0 lb

## 2012-06-21 DIAGNOSIS — G4730 Sleep apnea, unspecified: Secondary | ICD-10-CM | POA: Insufficient documentation

## 2012-06-21 DIAGNOSIS — G4733 Obstructive sleep apnea (adult) (pediatric): Secondary | ICD-10-CM

## 2012-06-21 DIAGNOSIS — G4710 Hypersomnia, unspecified: Secondary | ICD-10-CM | POA: Insufficient documentation

## 2012-06-22 DIAGNOSIS — R0989 Other specified symptoms and signs involving the circulatory and respiratory systems: Secondary | ICD-10-CM

## 2012-06-22 DIAGNOSIS — R0609 Other forms of dyspnea: Secondary | ICD-10-CM

## 2012-06-22 DIAGNOSIS — G4710 Hypersomnia, unspecified: Secondary | ICD-10-CM

## 2012-06-22 DIAGNOSIS — G4730 Sleep apnea, unspecified: Secondary | ICD-10-CM

## 2012-06-25 NOTE — Procedures (Signed)
NAME:  Mark Oconnor, Mark Oconnor NO.:  [id number]  MEDICAL RECORD NO.:  [id number]          PATIENT TYPE:  OUT  LOCATION:  SLEEP CENTER                 FACILITY:  [facility]  PHYSICIAN:  Audreyanna Butkiewicz D. Maple Hudson, MD, FCCP, FACPDATE OF BIRTH:  02-03-1981  DATE OF STUDY:  06/21/2012                           NOCTURNAL POLYSOMNOGRAM  REFERRING PHYSICIAN:  Sukhman Kocher D. Bennette Hasty, MD, FCCP, FACP  INDICATION FOR STUDY:  Hypersomnia with sleep apnea.  EPWORTH SLEEPINESS SCORE:  8/24.  BMI 40.6, weight 325 pounds, height 75 inches, neck 20.5 inches.  MEDICATIONS:  Home medications are charted and reviewed.  SLEEP ARCHITECTURE:  Total sleep time 281.5 minutes with sleep efficiency 79.1%.  Stage I was 4.4%, stage II, 92%.  Stage III absent, REM 3.6% of total sleep time.  Sleep latency 7 minutes, REM latency 231.5 minutes, awake after sleep onset 31.5 minutes.  Arousal index 16.  BEDTIME MEDICATION:  None.  RESPIRATORY DATA:  Apnea-hypopnea index (AHI) 1.7 per hour.  A total of 8 events was scored including 1 obstructive apnea, 2 central apneas, 5 hypopneas.  Most events were associated with supine sleep position and REM.  REM/AHI 6 per hour.  OXYGEN DATA:  Moderate snoring with oxygen desaturation to a nadir of 79% and mean oxygen saturation through the study of 95.3% on room air.  CARDIAC DATA:  Normal sinus rhythm.  MOVEMENT-PARASOMNIA:  No significant movement disturbance.  No bathroom trips.  IMPRESSIONS-RECOMMENDATIONS:  Occasional respiratory event with sleep disturbance, within normal limits.  Apnea-hypopnea index 1.7 per hour (the normal range for adults is from 0-5 events per hour).  Moderate snoring with oxygen desaturation to a nadir of 79% and mean oxygen saturation through the study of 95.3% on room air.    Gradie Ohm D. Maple Hudson, MD, [facility], FACP Diplomate, American Board of Sleep Medicine   CDY/MEDQ  D:  06/22/2012 10:28:08  T:  06/22/2012 13:12:49  Job:  [id number]

## 2012-06-27 ENCOUNTER — Encounter: Payer: BC Managed Care – PPO | Attending: General Surgery | Admitting: *Deleted

## 2012-06-27 ENCOUNTER — Ambulatory Visit (HOSPITAL_COMMUNITY)
Admission: RE | Admit: 2012-06-27 | Discharge: 2012-06-27 | Disposition: A | Discharge status: Home / Self Care | Payer: BC Managed Care – PPO | Source: Ambulatory Visit | Attending: General Surgery | Admitting: General Surgery

## 2012-06-27 ENCOUNTER — Encounter: Payer: Self-pay | Admitting: *Deleted

## 2012-06-27 ENCOUNTER — Other Ambulatory Visit: Payer: Self-pay

## 2012-06-27 VITALS — Ht 75.0 in | Wt 327.9 lb

## 2012-06-27 DIAGNOSIS — K219 Gastro-esophageal reflux disease without esophagitis: Secondary | ICD-10-CM

## 2012-06-27 DIAGNOSIS — IMO0001 Reserved for inherently not codable concepts without codable children: Secondary | ICD-10-CM

## 2012-06-27 DIAGNOSIS — J45909 Unspecified asthma, uncomplicated: Secondary | ICD-10-CM

## 2012-06-27 DIAGNOSIS — Z713 Dietary counseling and surveillance: Secondary | ICD-10-CM | POA: Insufficient documentation

## 2012-06-27 DIAGNOSIS — Z13820 Encounter for screening for osteoporosis: Secondary | ICD-10-CM | POA: Insufficient documentation

## 2012-06-27 DIAGNOSIS — Z6841 Body Mass Index (BMI) 40.0 and over, adult: Secondary | ICD-10-CM

## 2012-06-27 DIAGNOSIS — E119 Type 2 diabetes mellitus without complications: Secondary | ICD-10-CM | POA: Insufficient documentation

## 2012-06-27 DIAGNOSIS — Z01818 Encounter for other preprocedural examination: Secondary | ICD-10-CM | POA: Insufficient documentation

## 2012-06-27 DIAGNOSIS — I10 Essential (primary) hypertension: Secondary | ICD-10-CM | POA: Insufficient documentation

## 2012-06-27 DIAGNOSIS — E785 Hyperlipidemia, unspecified: Secondary | ICD-10-CM | POA: Insufficient documentation

## 2012-06-27 DIAGNOSIS — Z794 Long term (current) use of insulin: Secondary | ICD-10-CM | POA: Insufficient documentation

## 2012-06-29 NOTE — Patient Instructions (Addendum)
GOALS:   Follow Pre-Op Nutrition Goals to prepare for Gastric Bypass Surgery.  See Amy Y. [place], MD at [facility] for dermatology needs.  Aim for 150 min of physical activity weekly.  Aim for the following recommendations daily:    1600-1800 calories   180-200 g carbohydrates (45 g per meal/15 g per snack)  80-100 g protein  50 g fat  Call the [facility] at [phone] once you have been given your surgery date to enrolled in the Pre-Op Nutrition Class. You will need to attend this nutrition class 3-4 weeks prior to your surgery.   *NOTE: Make sure Dr. Everardo All writes in his next visit note that you saw me this month so it will count towards your supervised weight loss.

## 2012-06-29 NOTE — Progress Notes (Signed)
  Pre-Op Assessment Visit:  Pre-Operative RYGB Surgery/Supervised Weight Loss Visit  Medical Nutrition Therapy:  Appt start time: 1515   End time:  1550.  Patient was seen on 06/27/12 for Pre-Operative RYGB Nutrition Assessment. Assessment and letter of approval faxed to [facility] coordinator on 06/29/12.  Approval letter sent to [facility] and will be available in the chart under the media tab.  Handouts given during visit include:  Pre-Op Goals   Bariatric Surgery Protein Shakes  Samples given during visit include:   Premier Protein Shake Lot: [id number]; Exp: 03/15/13 - 1 ea Lot: [id number]; Exp: 05/20/13 - 1 ea  [employer] Protein Powder Lot: [id number]; Exp: 06/15 - 2 pkts Lot: [id number]; Exp: 03/15 - 2 pkts  GOALS:   Follow Pre-Op Nutrition Goals to prepare for Gastric Bypass Surgery.  See Amy Y. [place], MD at [facility] for dermatology needs.  Aim for 150 min of physical activity weekly.  Aim for the following recommendations daily:    1600-1800 calories   180-200 g carbohydrates (45 g per meal/15 g per snack)  80-100 g protein  50 g fat  Call the [facility] at [phone] once you have been given your surgery date to enrolled in the Pre-Op Nutrition Class. You will need to attend this nutrition class 3-4 weeks prior to your surgery.   *NOTE: Make sure Dr. Everardo All writes in his next visit note that you saw me this month so it will count towards your supervised weight loss.  Patient to call for Pre-Op and Post-Op Nutrition Education at the [facility] when surgery is scheduled.

## 2012-07-01 ENCOUNTER — Ambulatory Visit: Payer: BC Managed Care – PPO | Admitting: Endocrinology

## 2012-07-05 ENCOUNTER — Encounter (HOSPITAL_BASED_OUTPATIENT_CLINIC_OR_DEPARTMENT_OTHER): Payer: BC Managed Care – PPO

## 2012-07-12 ENCOUNTER — Encounter (HOSPITAL_COMMUNITY)
Admission: RE | Disposition: A | Discharge status: Home / Self Care | Payer: Self-pay | Source: Ambulatory Visit | Attending: General Surgery

## 2012-07-12 ENCOUNTER — Ambulatory Visit (HOSPITAL_COMMUNITY)
Admission: RE | Admit: 2012-07-12 | Discharge: 2012-07-12 | Disposition: A | Discharge status: Home / Self Care | Payer: BC Managed Care – PPO | Source: Ambulatory Visit | Attending: General Surgery | Admitting: General Surgery

## 2012-07-12 HISTORY — PX: BREATH TEK H PYLORI: SHX5422

## 2012-07-12 SURGERY — BREATH TEST, FOR HELICOBACTER PYLORI

## 2012-07-15 ENCOUNTER — Encounter (HOSPITAL_COMMUNITY): Payer: Self-pay | Admitting: General Surgery

## 2012-07-31 ENCOUNTER — Ambulatory Visit (INDEPENDENT_AMBULATORY_CARE_PROVIDER_SITE_OTHER): Payer: BC Managed Care – PPO | Admitting: Endocrinology

## 2012-07-31 VITALS — BP 126/80 | HR 77 | Ht 76.0 in | Wt 332.0 lb

## 2012-07-31 DIAGNOSIS — IMO0001 Reserved for inherently not codable concepts without codable children: Secondary | ICD-10-CM

## 2012-07-31 MED ORDER — INSULIN GLARGINE 100 UNIT/ML ~~LOC~~ SOLN: 160.0000 [IU] | Freq: Every day | SUBCUTANEOUS | Status: DC

## 2012-07-31 NOTE — Progress Notes (Signed)
Subjective:    Patient ID: Mark Oconnor, male    DOB: 27-May-1980, 32 y.o.   MRN: [id number]  HPI Pt returns for f/u of insulin-requiring DM (dx'ed 2011; no known complications; he says cbg's were overcontrolled on lantus 175 units qd, and he gained weight; he is on lantus + victoza, at his request, for purposes of his weight).  He is planning to have gastric bypass surgery in June or July.  no cbg record, but states cbg's vary from 80-330.  He says he sometimes misses the insulin injections.  Pt says the allergist told him that walking causes so much sweating that he gets uncontrollable itching.   Past Medical History  Diagnosis Date  . Diabetes mellitus   . Hyperlipidemia   . Hypertension   . Morbid obesity   . GERD (gastroesophageal reflux disease)     Past Surgical History  Procedure Laterality Date  . Breath tek h pylori N/A 07/12/2012    Procedure: BREATH TEK H PYLORI;  Surgeon: Mariella Saa, MD;  Location: [facility];  Service: General;  Laterality: N/A;    History   Social History  . Marital Status: Single    Spouse Name: N/A    Number of Children: N/A  . Years of Education: N/A   Occupational History  . Not on file.   Social History Main Topics  . Smoking status: Former Smoker -- 0.50 packs/day for 15 years    Types: Cigarettes    Quit date: 12/07/2011  . Smokeless tobacco: Never Used  . Alcohol Use: No     Comment: occasional  . Drug Use: No  . Sexually Active: Yes   Other Topics Concern  . Not on file   Social History Narrative  . No narrative on file    Current Outpatient Prescriptions on File Prior to Visit  Medication Sig Dispense Refill  . albuterol (VENTOLIN HFA) 108 (90 BASE) MCG/ACT inhaler Inhale 2 puffs into the lungs every 6 (six) hours as needed for wheezing.  18 g  0  . Fluticasone-Salmeterol (ADVAIR DISKUS) 250-50 MCG/DOSE AEPB Inhale 1 puff into the lungs 2 (two) times daily.  180 each  0  . hydrOXYzine (ATARAX/VISTARIL) 10 MG  tablet Take 10 mg by mouth every 4 (four) hours as needed.       . Liraglutide (VICTOZA) 18 MG/3ML SOLN Inject 1.8 mg into the skin daily.      . montelukast (SINGULAIR) 10 MG tablet Take 1 tablet (10 mg total) by mouth at bedtime.  30 tablet  11  . Olmesartan-Amlodipine-HCTZ (TRIBENZOR) 40-5-12.5 MG TABS Take 1 tablet by mouth daily.  90 tablet  3  . ONE TOUCH ULTRA TEST test strip USE TO TEST BLOOD SUGAR TWICE DAILY  150 each  11  . rosuvastatin (CRESTOR) 20 MG tablet Take 20 mg by mouth daily.         No current facility-administered medications on file prior to visit.    Allergies  Allergen Reactions  . Other     Tree nuts    Family History  Problem Relation Age of Onset  . Diabetes Father   . Cancer Neg Hx   . Heart disease Neg Hx   . Hyperlipidemia Neg Hx   . Hypertension Neg Hx   . Diabetes Mother   . Diabetes Brother     one brother  diabetic    BP 126/80  Pulse 77  Ht 6\' 4"  (1.93 m)  Wt 332 lb (150.594 kg)  BMI 40.43 kg/m2  SpO2 99%  Review of Systems denies hypoglycemia    Objective:   Physical Exam VITAL SIGNS:  See vs page GENERAL: no distress SKIN:  Insulin injection sites at the anterior thighs are normal.     Lab Results  Component Value Date   HGBA1C 13.7* 06/07/2012      Assessment & Plan:  DM: therapy limited by noncompliance.  i'll do the best i can.

## 2012-07-31 NOTE — Patient Instructions (Addendum)
Please continue the lantus, 160 units daily.  It is very important to take this daily.  i have resent a prescription to your pharmacy, to make sure the pharmacy gives you enough. Please come back for a follow-up appointment in 1 month. check your blood sugar twice a day.  vary the time of day when you check, between before the 3 meals, and at bedtime.  also check if you have symptoms of your blood sugar being too high or too low.  please keep a record of the readings and bring it to your next appointment here.  please call us sooner if your blood sugar goes below 70, or if you have a lot of readings over 200.

## 2012-08-01 ENCOUNTER — Telehealth: Payer: Self-pay | Admitting: Endocrinology

## 2012-08-01 NOTE — Telephone Encounter (Signed)
please call patient: i need to know his height

## 2012-08-01 NOTE — Telephone Encounter (Signed)
6.4

## 2012-08-28 ENCOUNTER — Other Ambulatory Visit (INDEPENDENT_AMBULATORY_CARE_PROVIDER_SITE_OTHER): Payer: BC Managed Care – PPO

## 2012-08-28 ENCOUNTER — Ambulatory Visit (INDEPENDENT_AMBULATORY_CARE_PROVIDER_SITE_OTHER): Payer: BC Managed Care – PPO | Admitting: Internal Medicine

## 2012-08-28 ENCOUNTER — Encounter: Payer: Self-pay | Admitting: Internal Medicine

## 2012-08-28 VITALS — BP 122/86 | HR 72 | Temp 98.2°F | Resp 16 | Ht 76.0 in | Wt 334.0 lb

## 2012-08-28 DIAGNOSIS — E782 Mixed hyperlipidemia: Secondary | ICD-10-CM

## 2012-08-28 DIAGNOSIS — I10 Essential (primary) hypertension: Secondary | ICD-10-CM

## 2012-08-28 DIAGNOSIS — IMO0001 Reserved for inherently not codable concepts without codable children: Secondary | ICD-10-CM

## 2012-08-28 DIAGNOSIS — Z0000 Encounter for general adult medical examination without abnormal findings: Secondary | ICD-10-CM

## 2012-08-28 LAB — COMPREHENSIVE METABOLIC PANEL
Albumin: 4.1 g/dL (ref 3.5–5.2)
BUN: 12 mg/dL (ref 6–23)
CO2: 28 mEq/L (ref 19–32)
Calcium: 9.2 mg/dL (ref 8.4–10.5)
Chloride: 98 mEq/L (ref 96–112)
Creatinine, Ser: 1.2 mg/dL (ref 0.4–1.5)
GFR: 90.15 mL/min (ref >60.00)
Glucose, Bld: 369 mg/dL — ABNORMAL HIGH (ref 70–99)
Potassium: 4 mEq/L (ref 3.5–5.1)

## 2012-08-28 LAB — LIPID PANEL
HDL: 33.6 mg/dL — ABNORMAL LOW (ref >39.00)
Triglycerides: 338 mg/dL — ABNORMAL HIGH (ref 0.0–149.0)

## 2012-08-28 LAB — TSH: TSH: 0.72 u[IU]/mL (ref 0.35–5.50)

## 2012-08-28 MED ORDER — CANAGLIFLOZIN 300 MG PO TABS: 1.0000 | ORAL_TABLET | Freq: Every day | ORAL | Status: DC

## 2012-08-28 NOTE — Patient Instructions (Signed)
Diabetes, Type 2 Diabetes is a long-lasting (chronic) disease. In type 2 diabetes, the pancreas does not make enough insulin (a hormone), and the body does not respond normally to the insulin that is made. This type of diabetes was also previously called adult-onset diabetes. It usually occurs after the age of 40, but it can occur at any age.  CAUSES  Type 2 diabetes happens because the pancreasis not making enough insulin or your body has trouble using the insulin that your pancreas does make properly. SYMPTOMS   Drinking more than usual.  Urinating more than usual.  Blurred vision.  Dry, itchy skin.  Frequent infections.  Feeling more tired than usual (fatigue). DIAGNOSIS The diagnosis of type 2 diabetes is usually made by one of the following tests:  Fasting blood glucose test. You will not eat for at least 8 hours and then take a blood test.  Random blood glucose test. Your blood glucose (sugar) is checked at any time of the day regardless of when you ate.  Oral glucose tolerance test (OGTT). Your blood glucose is measured after you have not eaten (fasted) and then after you drink a glucose containing beverage. TREATMENT   Healthy eating.  Exercise.  Medicine, if needed.  Monitoring blood glucose.  Seeing your caregiver regularly. HOME CARE INSTRUCTIONS   Check your blood glucose at least once a day. More frequent monitoring may be necessary, depending on your medicines and on how well your diabetes is controlled. Your caregiver will advise you.  Take your medicine as directed by your caregiver.  Do not smoke.  Make wise food choices. Ask your caregiver for information. Weight loss can improve your diabetes.  Learn about low blood glucose (hypoglycemia) and how to treat it.  Get your eyes checked regularly.  Have a yearly physical exam. Have your blood pressure checked and your blood and urine tested.  Wear a pendant or bracelet saying that you have  diabetes.  Check your feet every night for cuts, sores, blisters, and redness. Let your caregiver know if you have any problems. SEEK MEDICAL CARE IF:   You have problems keeping your blood glucose in target range.  You have problems with your medicines.  You have symptoms of an illness that do not improve after 24 hours.  You have a sore or wound that is not healing.  You notice a change in vision or a new problem with your vision.  You have a fever. MAKE SURE YOU:  Understand these instructions.  Will watch your condition.  Will get help right away if you are not doing well or get worse. Document Released: 04/24/2005 Document Revised: 07/17/2011 Document Reviewed: 10/10/2010 ExitCare Patient Information 2013 [employer]. Health Maintenance, Males A healthy lifestyle and preventative care can promote health and wellness.  Maintain regular health, dental, and eye exams.  Eat a healthy diet. Foods like vegetables, fruits, whole grains, low-fat dairy products, and lean protein foods contain the nutrients you need without too many calories. Decrease your intake of foods high in solid fats, added sugars, and salt. Get information about a proper diet from your caregiver, if necessary.  Regular physical exercise is one of the most important things you can do for your health. Most adults should get at least 150 minutes of moderate-intensity exercise (any activity that increases your heart rate and causes you to sweat) each week. In addition, most adults need muscle-strengthening exercises on 2 or more days a week.   Maintain a healthy weight. The   body mass index (BMI) is a screening tool to identify possible weight problems. It provides an estimate of body fat based on height and weight. Your caregiver can help determine your BMI, and can help you achieve or maintain a healthy weight. For adults 20 years and older:  A BMI below 18.5 is considered underweight.  A BMI of 18.5 to  24.9 is normal.  A BMI of 25 to 29.9 is considered overweight.  A BMI of 30 and above is considered obese.  Maintain normal blood lipids and cholesterol by exercising and minimizing your intake of saturated fat. Eat a balanced diet with plenty of fruits and vegetables. Blood tests for lipids and cholesterol should begin at age 20 and be repeated every 5 years. If your lipid or cholesterol levels are high, you are over 50, or you are a high risk for heart disease, you may need your cholesterol levels checked more frequently.Ongoing high lipid and cholesterol levels should be treated with medicines, if diet and exercise are not effective.  If you smoke, find out from your caregiver how to quit. If you do not use tobacco, do not start.  If you choose to drink alcohol, do not exceed 2 drinks per day. One drink is considered to be 12 ounces (355 mL) of beer, 5 ounces (148 mL) of wine, or 1.5 ounces (44 mL) of liquor.  Avoid use of street drugs. Do not share needles with anyone. Ask for help if you need support or instructions about stopping the use of drugs.  High blood pressure causes heart disease and increases the risk of stroke. Blood pressure should be checked at least every 1 to 2 years. Ongoing high blood pressure should be treated with medicines if weight loss and exercise are not effective.  If you are 45 to 32 years old, ask your caregiver if you should take aspirin to prevent heart disease.  Diabetes screening involves taking a blood sample to check your fasting blood sugar level. This should be done once every 3 years, after age 45, if you are within normal weight and without risk factors for diabetes. Testing should be considered at a younger age or be carried out more frequently if you are overweight and have at least 1 risk factor for diabetes.  Colorectal cancer can be detected and often prevented. Most routine colorectal cancer screening begins at the age of 50 and continues through  age 75. However, your caregiver may recommend screening at an earlier age if you have risk factors for colon cancer. On a yearly basis, your caregiver may provide home test kits to check for hidden blood in the stool. Use of a small camera at the end of a tube, to directly examine the colon (sigmoidoscopy or colonoscopy), can detect the earliest forms of colorectal cancer. Talk to your caregiver about this at age 50, when routine screening begins. Direct examination of the colon should be repeated every 5 to 10 years through age 75, unless early forms of pre-cancerous polyps or small growths are found.  Hepatitis C blood testing is recommended for all people born from 1945 through 1965 and any individual with known risks for hepatitis C.  Healthy men should no longer receive prostate-specific antigen (PSA) blood tests as part of routine cancer screening. Consult with your caregiver about prostate cancer screening.  Testicular cancer screening is not recommended for adolescents or adult males who have no symptoms. Screening includes self-exam, caregiver exam, and other screening tests. Consult with your caregiver   about any symptoms you have or any concerns you have about testicular cancer.  Practice safe sex. Use condoms and avoid high-risk sexual practices to reduce the spread of sexually transmitted infections (STIs).  Use sunscreen with a sun protection factor (SPF) of 30 or greater. Apply sunscreen liberally and repeatedly throughout the day. You should seek shade when your shadow is shorter than you. Protect yourself by wearing long sleeves, pants, a wide-brimmed hat, and sunglasses year round, whenever you are outdoors.  Notify your caregiver of new moles or changes in moles, especially if there is a change in shape or color. Also notify your caregiver if a mole is larger than the size of a pencil eraser.  A one-time screening for abdominal aortic aneurysm (AAA) and surgical repair of large AAAs  by sound wave imaging (ultrasonography) is recommended for ages 65 to 75 years who are current or former smokers.  Stay current with your immunizations. Document Released: 10/21/2007 Document Revised: 07/17/2011 Document Reviewed: 09/19/2010 ExitCare Patient Information 2013 [employer].  

## 2012-08-28 NOTE — Progress Notes (Signed)
Subjective:    Patient ID: Mark Oconnor, male    DOB: 1980-09-07, 32 y.o.   MRN: [id number]  Diabetes He presents for his follow-up diabetic visit. He has type 1 diabetes mellitus. His disease course has been worsening. There are no hypoglycemic associated symptoms. Pertinent negatives for hypoglycemia include no dizziness, pallor or tremors. Associated symptoms include polydipsia, polyphagia and polyuria. Pertinent negatives for diabetes include no blurred vision, no chest pain, no fatigue, no foot paresthesias, no foot ulcerations, no visual change, no weakness and no weight loss. There are no hypoglycemic complications. Symptoms are worsening. There are no diabetic complications. Current diabetic treatment includes intensive insulin program, insulin injections and oral agent (monotherapy). He is compliant with treatment most of the time. His weight is increasing steadily. He is following a generally unhealthy diet. When asked about meal planning, he reported none. He has not had a previous visit with a dietician. He never participates in exercise. His home blood glucose trend is increasing steadily. His breakfast blood glucose range is generally >200 mg/dl. His lunch blood glucose range is generally >200 mg/dl. His dinner blood glucose range is generally >200 mg/dl. His highest blood glucose is >200 mg/dl. His overall blood glucose range is >200 mg/dl. An ACE inhibitor/angiotensin II receptor blocker is being taken. He does not see a podiatrist.Eye exam is current.      Review of Systems  Constitutional: Positive for unexpected weight change. Negative for fever, chills, weight loss, diaphoresis, activity change, appetite change and fatigue.  HENT: Negative.   Eyes: Negative.  Negative for blurred vision.  Respiratory: Negative.  Negative for apnea, cough, choking, chest tightness, shortness of breath, wheezing and stridor.   Cardiovascular: Negative.  Negative for chest pain, palpitations and  leg swelling.  Gastrointestinal: Negative for nausea, vomiting, abdominal pain, diarrhea and constipation.  Endocrine: Positive for polydipsia, polyphagia and polyuria.  Musculoskeletal: Negative.  Negative for myalgias, back pain, joint swelling, arthralgias and gait problem.  Skin: Negative for color change, pallor, rash and wound.  Allergic/Immunologic: Negative.   Neurological: Negative.  Negative for dizziness, tremors, weakness, light-headedness and numbness.  Hematological: Negative.  Negative for adenopathy. Does not bruise/bleed easily.  Psychiatric/Behavioral: Negative.        Objective:   Physical Exam  Vitals reviewed. Constitutional: He is oriented to person, place, and time. He appears well-developed and well-nourished. No distress.  HENT:  Head: Normocephalic and atraumatic.  Mouth/Throat: Oropharynx is clear and moist. No oropharyngeal exudate.  Eyes: Conjunctivae are normal. Right eye exhibits no discharge. Left eye exhibits no discharge. No scleral icterus.  Neck: Normal range of motion. Neck supple. No JVD present. No tracheal deviation present. No thyromegaly present.  Cardiovascular: Normal rate, regular rhythm, normal heart sounds and intact distal pulses.  Exam reveals no gallop and no friction rub.   No murmur heard. Pulmonary/Chest: Effort normal and breath sounds normal. No stridor. No respiratory distress. He has no wheezes. He has no rales. He exhibits no tenderness.  Abdominal: Soft. Bowel sounds are normal. He exhibits no distension and no mass. There is no tenderness. There is no rebound and no guarding. Hernia confirmed negative in the right inguinal area and confirmed negative in the left inguinal area.  Genitourinary: Testes normal and penis normal. Right testis shows no mass, no swelling and no tenderness. Right testis is descended. Left testis shows no mass, no swelling and no tenderness. Left testis is descended. Circumcised. No phimosis, paraphimosis,  hypospadias, penile erythema or penile tenderness. No discharge  found.  Musculoskeletal: Normal range of motion. He exhibits no edema and no tenderness.  Lymphadenopathy:    He has no cervical adenopathy.       Right: No inguinal adenopathy present.       Left: No inguinal adenopathy present.  Neurological: He is oriented to person, place, and time.  Skin: Skin is warm and dry. No rash noted. He is not diaphoretic. No erythema. No pallor.  Psychiatric: He has a normal mood and affect. His behavior is normal. Judgment and thought content normal.     Lab Results  Component Value Date   WBC 8.0 06/07/2012   HGB 16.2 06/07/2012   HCT 47.2 06/07/2012   PLT 273 06/07/2012   GLUCOSE 70 06/07/2012   CHOL 279* 06/07/2012   TRIG 173* 06/07/2012   HDL 42 06/07/2012   LDLDIRECT 123.7 12/22/2011   LDLCALC 202* 06/07/2012   ALT 14 06/07/2012   AST 12 06/07/2012   NA 142 06/07/2012   K 3.7 06/07/2012   CL 103 06/07/2012   CREATININE 1.23 06/07/2012   BUN 11 06/07/2012   CO2 24 06/07/2012   TSH 0.578 06/07/2012   PSA 0.53 04/26/2011   HGBA1C 13.7* 06/07/2012       Assessment & Plan:

## 2012-08-29 ENCOUNTER — Encounter: Payer: Self-pay | Admitting: Internal Medicine

## 2012-08-29 MED ORDER — COLESEVELAM HCL 3.75 G PO PACK: 1.0000 | PACK | Freq: Every day | ORAL | Status: DC

## 2012-08-29 MED ORDER — ROSUVASTATIN CALCIUM 20 MG PO TABS: 20.0000 mg | ORAL_TABLET | Freq: Every day | ORAL | Status: DC

## 2012-08-29 NOTE — Assessment & Plan Note (Signed)
His BP is well controlled 

## 2012-08-29 NOTE — Assessment & Plan Note (Signed)
His A1C is very high so I have asked him to start welchol and invokana, he may also need a higher dose of insulin Will also ask him to f/up with ENDO and diabetic education

## 2012-08-29 NOTE — Assessment & Plan Note (Signed)
Exam done Labs ordered Pt ed material was given

## 2012-08-29 NOTE — Assessment & Plan Note (Signed)
His LDL remains a little too high so I have asked him to start welchol Otherwise he is doing well on crestor

## 2012-08-30 ENCOUNTER — Telehealth: Payer: Self-pay | Admitting: Internal Medicine

## 2012-08-30 NOTE — Telephone Encounter (Signed)
Discuss A1C and lipid results with pt

## 2012-08-30 NOTE — Telephone Encounter (Signed)
Patient would like a call back to discuss new medications that were called in for him as well as an appointment that was make for his to see Dr. Everardo All

## 2012-09-06 ENCOUNTER — Ambulatory Visit: Payer: BC Managed Care – PPO | Admitting: Endocrinology

## 2012-10-01 ENCOUNTER — Telehealth: Payer: Self-pay | Admitting: Internal Medicine

## 2012-10-01 NOTE — Telephone Encounter (Signed)
LMTCB

## 2012-10-03 NOTE — Telephone Encounter (Signed)
Spoke with pt Mark Oconnor states Mark Oconnor has a form that Mark Oconnor wants CDY to complete for him  It was done in Jan at last ov, but not completed Mark Oconnor has the form and will fax this to the up front fax line  Will forward to Florentina Addison so that she is aware to keep an eye out for this Thanks!

## 2012-10-03 NOTE — Telephone Encounter (Signed)
LMTCBx2. Jullian Previti, CMA  

## 2012-10-04 ENCOUNTER — Encounter: Payer: Self-pay | Admitting: Endocrinology

## 2012-10-04 ENCOUNTER — Ambulatory Visit (INDEPENDENT_AMBULATORY_CARE_PROVIDER_SITE_OTHER): Payer: BC Managed Care – PPO | Admitting: Endocrinology

## 2012-10-04 VITALS — BP 130/80 | HR 80 | Ht 75.0 in | Wt 336.0 lb

## 2012-10-04 DIAGNOSIS — IMO0001 Reserved for inherently not codable concepts without codable children: Secondary | ICD-10-CM

## 2012-10-04 NOTE — Progress Notes (Signed)
Subjective:    Patient ID: Mark Oconnor, male    DOB: [birth year], 32 y.o.   MRN: [id number]  HPI Pt returns for f/u of insulin-requiring DM (dx'ed 2011; he has mild if any neuropathy of the lower extremities; he has no known associated complications; he says cbg's were overcontrolled on lantus 175 units qd, and he gained weight; he is on lantus + victoza, at his request, for purposes of his weight).  He is planning to have gastric bypass surgery in July or august.  no cbg record, but states cbg's vary from 80-265.  He says he frequently skips the insulin injections, because he fears these are causing weight gain.   Past Medical History  Diagnosis Date  . Diabetes mellitus   . Hyperlipidemia   . Hypertension   . Morbid obesity   . GERD (gastroesophageal reflux disease)     Past Surgical History  Procedure Laterality Date  . Breath tek h pylori N/A 07/12/2012    Procedure: BREATH TEK H PYLORI;  Surgeon: Mariella Saa, MD;  Location: [facility];  Service: General;  Laterality: N/A;    History   Social History  . Marital Status: Single    Spouse Name: N/A    Number of Children: N/A  . Years of Education: N/A   Occupational History  . Not on file.   Social History Main Topics  . Smoking status: Former Smoker -- 0.50 packs/day for 15 years    Types: Cigarettes    Quit date: 12/07/2011  . Smokeless tobacco: Never Used  . Alcohol Use: No     Comment: occasional  . Drug Use: No  . Sexually Active: Yes   Other Topics Concern  . Not on file   Social History Narrative  . No narrative on file    Current Outpatient Prescriptions on File Prior to Visit  Medication Sig Dispense Refill  . albuterol (VENTOLIN HFA) 108 (90 BASE) MCG/ACT inhaler Inhale 2 puffs into the lungs every 6 (six) hours as needed for wheezing.  18 g  0  . Canagliflozin (INVOKANA) 300 MG TABS Take 1 tablet by mouth daily.  90 tablet  1  . Colesevelam HCl 3.75 G PACK Take 1 packet by mouth daily.  90  each  3  . Fluticasone-Salmeterol (ADVAIR DISKUS) 250-50 MCG/DOSE AEPB Inhale 1 puff into the lungs 2 (two) times daily.  180 each  0  . hydrOXYzine (ATARAX/VISTARIL) 10 MG tablet Take 10 mg by mouth every 4 (four) hours as needed.       . insulin glargine (LANTUS) 100 UNIT/ML injection Inject 1.6 mLs (160 Units total) into the skin daily.  60 mL  11  . Liraglutide (VICTOZA) 18 MG/3ML SOLN Inject 1.8 mg into the skin daily.      . montelukast (SINGULAIR) 10 MG tablet Take 1 tablet (10 mg total) by mouth at bedtime.  30 tablet  11  . Olmesartan-Amlodipine-HCTZ (TRIBENZOR) 40-5-12.5 MG TABS Take 1 tablet by mouth daily.  90 tablet  3  . ONE TOUCH ULTRA TEST test strip USE TO TEST BLOOD SUGAR TWICE DAILY  150 each  11  . rosuvastatin (CRESTOR) 20 MG tablet Take 1 tablet (20 mg total) by mouth daily.  90 tablet  3   No current facility-administered medications on file prior to visit.    Allergies  Allergen Reactions  . Other     Tree nuts    Family History  Problem Relation Age of Onset  . Diabetes  Father   . Cancer Neg Hx   . Heart disease Neg Hx   . Hyperlipidemia Neg Hx   . Hypertension Neg Hx   . Diabetes Mother   . Diabetes Brother     one brother  diabetic    BP 130/80  Pulse 80  Ht 6\' 3"  (1.905 m)  Wt 336 lb (152.409 kg)  BMI 42 kg/m2  SpO2 98%   Review of Systems denies hypoglycemia and sob    Objective:   Physical Exam VITAL SIGNS:  See vs page.   GENERAL: no distress.  Obese.     Lab Results  Component Value Date   HGBA1C 15.5* 08/28/2012      Assessment & Plan:  DM: therapy limited by severe noncompliance.  i'll do the best i can.  This insulin regimen was chosen from multiple options, for its simplicity.  The benefits of glycemic control must be weighed against the risks of hypoglycemia.

## 2012-10-04 NOTE — Patient Instructions (Addendum)
Please continue the lantus, 160 units daily.  It is very important to take this every day.  If your blood sugar stays this high, dr Johna Sheriff may need to delay your surgery until it gets better.   Please come back for a follow-up appointment in 1 month. check your blood sugar twice a day.  vary the time of day when you check, between before the 3 meals, and at bedtime.  also check if you have symptoms of your blood sugar being too high or too low.  please keep a record of the readings and bring it to your next appointment here.  please call us sooner if your blood sugar goes below 70, or if you have a lot of readings over 200.

## 2012-10-07 NOTE — Telephone Encounter (Addendum)
Will forward to Katie to keep an eye out for the form

## 2012-10-07 NOTE — Telephone Encounter (Signed)
As of today I have NOT received any paperwork via fax for patient. Left message for patient to call back and have him refax the papers to my attention.

## 2012-10-07 NOTE — Telephone Encounter (Signed)
Katie please advise if this has been taken care of so msg can be closed. Thanks!!

## 2012-10-07 NOTE — Telephone Encounter (Signed)
Pt called back stating he is faxing the papers now to # [phone] and will put it to Katie's attention. Mark Oconnor

## 2012-10-07 NOTE — Telephone Encounter (Signed)
Papers and message on CY's cart.  

## 2012-10-08 NOTE — Telephone Encounter (Signed)
I am not supervising a weight loss program for this patient. He needs to get any relevant paper work done by that physician- ? Primary care?

## 2012-10-08 NOTE — Telephone Encounter (Signed)
LMTCB-inform of the below per CY.

## 2012-10-08 NOTE — Telephone Encounter (Signed)
Pt is calling to get an update on the forms.  Please advise.  Can be reached at [phone].  Mark Oconnor

## 2012-10-08 NOTE — Telephone Encounter (Signed)
I spoke with the pt and advised that paperwork has been received and given to CY to review. Pt states he wants to pick-up paperwork when completed. Carron Curie, CMA

## 2012-10-09 NOTE — Telephone Encounter (Signed)
I spoke with Mark Oconnor. Advised him of this but he stated the only thing he needs CDY  To write is for Mark Oconnor to take a walk in the park or go swimming. Also need his meds placed on form as well. Please advise thanks

## 2012-10-10 ENCOUNTER — Ambulatory Visit (INDEPENDENT_AMBULATORY_CARE_PROVIDER_SITE_OTHER): Payer: BC Managed Care – PPO | Admitting: Internal Medicine

## 2012-10-10 ENCOUNTER — Encounter: Payer: Self-pay | Admitting: Internal Medicine

## 2012-10-10 VITALS — BP 130/90 | HR 82 | Temp 98.3°F | Resp 16 | Wt 333.0 lb

## 2012-10-10 DIAGNOSIS — M545 Low back pain, unspecified: Secondary | ICD-10-CM | POA: Insufficient documentation

## 2012-10-10 MED ORDER — METHOCARBAMOL 750 MG PO TABS: 750.0000 mg | ORAL_TABLET | Freq: Four times a day (QID) | ORAL | Status: DC

## 2012-10-10 NOTE — Assessment & Plan Note (Signed)
He has straight forward mechanical LBP with no danger signs and the pain is improving He wants to try a muscle relaxer in addition to the nsaids and hydrocodone He was given pt ed material about LBP

## 2012-10-10 NOTE — Progress Notes (Signed)
Subjective:    Patient ID: Mark Oconnor, male    DOB: 1981/03/20, 32 y.o.   MRN: [id number]  Back Pain This is a new problem. The current episode started in the past 7 days. The problem occurs intermittently. The problem has been rapidly improving since onset. The pain is present in the lumbar spine. The quality of the pain is described as aching. The pain does not radiate. The pain is at a severity of 3/10. The pain is mild. The pain is worse during the day. The symptoms are aggravated by standing and position. Pertinent negatives include no abdominal pain, bladder incontinence, bowel incontinence, chest pain, dysuria, fever, headaches, leg pain, numbness, paresis, paresthesias, pelvic pain, perianal numbness, tingling, weakness or weight loss. Risk factors include obesity, lack of exercise and sedentary lifestyle. Treatments tried: hydrocodone and nsaids. The treatment provided moderate relief.      Review of Systems  Constitutional: Negative.  Negative for fever and weight loss.  HENT: Negative.   Eyes: Negative.   Respiratory: Negative.   Cardiovascular: Negative.  Negative for chest pain.  Gastrointestinal: Negative.  Negative for abdominal pain and bowel incontinence.  Endocrine: Negative.   Genitourinary: Negative.  Negative for bladder incontinence, dysuria and pelvic pain.  Musculoskeletal: Positive for back pain. Negative for myalgias, joint swelling, arthralgias and gait problem.  Skin: Negative.   Allergic/Immunologic: Negative.   Neurological: Negative.  Negative for dizziness, tingling, weakness, numbness, headaches and paresthesias.  Hematological: Negative.   Psychiatric/Behavioral: Negative.        Objective:   Physical Exam  Vitals reviewed. Constitutional: He is oriented to person, place, and time. He appears well-developed and well-nourished. No distress.  HENT:  Head: Normocephalic and atraumatic.  Mouth/Throat: Oropharynx is clear and moist. No oropharyngeal  exudate.  Eyes: Conjunctivae are normal. Right eye exhibits no discharge. Left eye exhibits no discharge. No scleral icterus.  Neck: Normal range of motion. Neck supple. No JVD present. No tracheal deviation present. No thyromegaly present.  Cardiovascular: Normal rate, regular rhythm, normal heart sounds and intact distal pulses.  Exam reveals no gallop and no friction rub.   No murmur heard. Pulmonary/Chest: Effort normal and breath sounds normal. No stridor. No respiratory distress. He has no wheezes. He has no rales. He exhibits no tenderness.  Abdominal: Soft. Bowel sounds are normal. He exhibits no distension and no mass. There is no tenderness. There is no rebound and no guarding.  Musculoskeletal: Normal range of motion. He exhibits no edema and no tenderness.       Lumbar back: Normal. He exhibits normal range of motion, no tenderness, no bony tenderness, no swelling, no edema, no deformity, no laceration, no pain, no spasm and normal pulse.  Lymphadenopathy:    He has no cervical adenopathy.  Neurological: He is alert and oriented to person, place, and time. He has normal strength. He displays no atrophy, no tremor and normal reflexes. No cranial nerve deficit or sensory deficit. He exhibits normal muscle tone. He displays a negative Romberg sign. He displays no seizure activity. Coordination and gait normal.  Reflex Scores:      Tricep reflexes are 1+ on the right side and 1+ on the left side.      Bicep reflexes are 1+ on the right side and 1+ on the left side.      Brachioradialis reflexes are 1+ on the right side and 1+ on the left side.      Patellar reflexes are 0 on the right side  and 0 on the left side.      Achilles reflexes are 0 on the right side and 0 on the left side. Neg SLR in BLE  Skin: Skin is warm and dry. No rash noted. He is not diaphoretic. No erythema. No pallor.  Psychiatric: He has a normal mood and affect. His behavior is normal. Judgment and thought content  normal.     Lab Results  Component Value Date   WBC 8.0 06/07/2012   HGB 16.2 06/07/2012   HCT 47.2 06/07/2012   PLT 273 06/07/2012   GLUCOSE 369* 08/28/2012   CHOL 261* 08/28/2012   TRIG 338.0* 08/28/2012   HDL 33.60* 08/28/2012   LDLDIRECT 162.0 08/28/2012   LDLCALC 202* 06/07/2012   ALT 18 08/28/2012   AST 13 08/28/2012   NA 134* 08/28/2012   K 4.0 08/28/2012   CL 98 08/28/2012   CREATININE 1.2 08/28/2012   BUN 12 08/28/2012   CO2 28 08/28/2012   TSH 0.72 08/28/2012   PSA 0.53 04/26/2011   HGBA1C 15.5* 08/28/2012       Assessment & Plan:

## 2012-10-10 NOTE — Patient Instructions (Signed)
Back Pain, Adult  Low back pain is very common. About 1 in 5 people have back pain. The cause of low back pain is rarely dangerous. The pain often gets better over time. About half of people with a sudden onset of back pain feel better in just 2 weeks. About 8 in 10 people feel better by 6 weeks.   CAUSES  Some common causes of back pain include:  · Strain of the muscles or ligaments supporting the spine.  · Wear and tear (degeneration) of the spinal discs.  · Arthritis.  · Direct injury to the back.  DIAGNOSIS  Most of the time, the direct cause of low back pain is not known. However, back pain can be treated effectively even when the exact cause of the pain is unknown. Answering your caregiver's questions about your overall health and symptoms is one of the most accurate ways to make sure the cause of your pain is not dangerous. If your caregiver needs more information, he or she may order lab work or imaging tests (X-rays or MRIs). However, even if imaging tests show changes in your back, this usually does not require surgery.  HOME CARE INSTRUCTIONS  For many people, back pain returns. Since low back pain is rarely dangerous, it is often a condition that people can learn to manage on their own.   · Remain active. It is stressful on the back to sit or stand in one place. Do not sit, drive, or stand in one place for more than 30 minutes at a time. Take short walks on level surfaces as soon as pain allows. Try to increase the length of time you walk each day.  · Do not stay in bed. Resting more than 1 or 2 days can delay your recovery.  · Do not avoid exercise or work. Your body is made to move. It is not dangerous to be active, even though your back may hurt. Your back will likely heal faster if you return to being active before your pain is gone.  · Pay attention to your body when you  bend and lift. Many people have less discomfort when lifting if they bend their knees, keep the load close to their bodies, and  avoid twisting. Often, the most comfortable positions are those that put less stress on your recovering back.  · Find a comfortable position to sleep. Use a firm mattress and lie on your side with your knees slightly bent. If you lie on your back, put a pillow under your knees.  · Only take over-the-counter or prescription medicines as directed by your caregiver. Over-the-counter medicines to reduce pain and inflammation are often the most helpful. Your caregiver may prescribe muscle relaxant drugs. These medicines help dull your pain so you can more quickly return to your normal activities and healthy exercise.  · Put ice on the injured area.  · Put ice in a plastic bag.  · Place a towel between your skin and the bag.  · Leave the ice on for 15-20 minutes, 3-4 times a day for the first 2 to 3 days. After that, ice and heat may be alternated to reduce pain and spasms.  · Ask your caregiver about trying back exercises and gentle massage. This may be of some benefit.  · Avoid feeling anxious or stressed. Stress increases muscle tension and can worsen back pain. It is important to recognize when you are anxious or stressed and learn ways to manage it. Exercise is a great option.  SEEK MEDICAL CARE IF:  · You have pain that is not relieved with rest or   medicine.  · You have pain that does not improve in 1 week.  · You have new symptoms.  · You are generally not feeling well.  SEEK IMMEDIATE MEDICAL CARE IF:   · You have pain that radiates from your back into your legs.  · You develop new bowel or bladder control problems.  · You have unusual weakness or numbness in your arms or legs.  · You develop nausea or vomiting.  · You develop abdominal pain.  · You feel faint.  Document Released: 04/24/2005 Document Revised: 10/24/2011 Document Reviewed: 09/12/2010  [employer]® Patient Information ©2014 [employer].

## 2012-10-17 NOTE — Telephone Encounter (Signed)
Spoke with patient-aware that CY filled out forms stating about being able to take walks in the park. Pt requested I call him back on Friday AM 10-18-12 to check on what fax number to fax the forms to. Will hold message in my box to document where I faxed the paperwork to.

## 2012-10-17 NOTE — Telephone Encounter (Signed)
CY - please advise. Thanks! 

## 2012-10-17 NOTE — Telephone Encounter (Signed)
Katie, I see that CY is not going to supervise a weight loss program for patient.  Is okay with the doing letter that pt is requesting below?  Please advise as this message has been open since 5.27.14.  Thanks!

## 2012-10-18 NOTE — Telephone Encounter (Signed)
Pt returned call and asked for papers to be faxed to # [phone], but call him first bc this is a public fax machine. Mark Oconnor

## 2012-10-18 NOTE — Telephone Encounter (Signed)
Spoke with pt.  He is ok to have this faxed now.  Katie aware and is faxing to fax # below.  Pt aware to call back if anything further is needed.  He voiced no further questions or concerns at this time.

## 2012-10-18 NOTE — Telephone Encounter (Signed)
Left message for patient to call back with fax number where to send paperwork for him today.

## 2012-10-22 ENCOUNTER — Telehealth: Payer: Self-pay | Admitting: Internal Medicine

## 2012-10-22 NOTE — Telephone Encounter (Addendum)
The pt called and is hoping to get samples of a medication.  He could not recall the name or type of medication he was hoping to received.  The patient stated it had been "well over a year" since he took the meds.  I offered him the next available apt and told him the office would be happy to treat his needs, after being seen in the office.  The patient denied an appointment stating he would not come into the office.

## 2012-11-01 ENCOUNTER — Encounter: Payer: Self-pay | Admitting: Endocrinology

## 2012-11-01 ENCOUNTER — Ambulatory Visit (INDEPENDENT_AMBULATORY_CARE_PROVIDER_SITE_OTHER): Payer: BC Managed Care – PPO | Admitting: Endocrinology

## 2012-11-01 VITALS — BP 128/80 | HR 74 | Ht 76.0 in | Wt 340.0 lb

## 2012-11-01 DIAGNOSIS — IMO0001 Reserved for inherently not codable concepts without codable children: Secondary | ICD-10-CM

## 2012-11-01 NOTE — Patient Instructions (Addendum)
Please reduce the lantus to 150 units daily.  It is very important to take this every day.    Please come back for a follow-up appointment in 2 months. check your blood sugar twice a day.  vary the time of day when you check, between before the 3 meals, and at bedtime.  also check if you have symptoms of your blood sugar being too high or too low.  please keep a record of the readings and bring it to your next appointment here.  please call us sooner if your blood sugar goes below 70, or if you have a lot of readings over 200.

## 2012-11-01 NOTE — Progress Notes (Signed)
Subjective:    Patient ID: Mark Oconnor, male    DOB: [birth year], 32 y.o.   MRN: [id number]  Diabetes  Pt returns for f/u of insulin-requiring DM (dx'ed 2011; he has mild if any neuropathy of the lower extremities; he has no known associated complications; he says cbg's were overcontrolled on lantus 175 units qd, and he gained weight; he is on lantus + victoza, at his request, for purposes of his weight).  He is planning to have gastric bypass surgery soon.  no cbg record, but states he is having mild hypoglycemia almost daily.  It is sometimes as high as 265.  He says he now never misses the insulin injections.  Past Medical History  Diagnosis Date  . Diabetes mellitus   . Hyperlipidemia   . Hypertension   . Morbid obesity   . GERD (gastroesophageal reflux disease)     Past Surgical History  Procedure Laterality Date  . Breath tek h pylori N/A 07/12/2012    Procedure: BREATH TEK H PYLORI;  Surgeon: Mariella Saa, MD;  Location: [facility];  Service: General;  Laterality: N/A;    History   Social History  . Marital Status: Single    Spouse Name: N/A    Number of Children: N/A  . Years of Education: N/A   Occupational History  . Not on file.   Social History Main Topics  . Smoking status: Former Smoker -- 0.50 packs/day for 15 years    Types: Cigarettes    Quit date: 12/07/2011  . Smokeless tobacco: Never Used  . Alcohol Use: No     Comment: occasional  . Drug Use: No  . Sexually Active: Yes   Other Topics Concern  . Not on file   Social History Narrative  . No narrative on file    Current Outpatient Prescriptions on File Prior to Visit  Medication Sig Dispense Refill  . albuterol (VENTOLIN HFA) 108 (90 BASE) MCG/ACT inhaler Inhale 2 puffs into the lungs every 6 (six) hours as needed for wheezing.  18 g  0  . Canagliflozin (INVOKANA) 300 MG TABS Take 1 tablet by mouth daily.  90 tablet  1  . Colesevelam HCl 3.75 G PACK Take 1 packet by mouth daily.  90  each  3  . Fluticasone-Salmeterol (ADVAIR DISKUS) 250-50 MCG/DOSE AEPB Inhale 1 puff into the lungs 2 (two) times daily.  180 each  0  . hydrOXYzine (ATARAX/VISTARIL) 10 MG tablet Take 10 mg by mouth every 4 (four) hours as needed.       . insulin glargine (LANTUS) 100 UNIT/ML injection Inject 1.6 mLs (160 Units total) into the skin daily.  60 mL  11  . Liraglutide (VICTOZA) 18 MG/3ML SOLN Inject 1.8 mg into the skin daily.      . methocarbamol (ROBAXIN-750) 750 MG tablet Take 1 tablet (750 mg total) by mouth 4 (four) times daily.  25 tablet  0  . montelukast (SINGULAIR) 10 MG tablet Take 1 tablet (10 mg total) by mouth at bedtime.  30 tablet  11  . Olmesartan-Amlodipine-HCTZ (TRIBENZOR) 40-5-12.5 MG TABS Take 1 tablet by mouth daily.  90 tablet  3  . ONE TOUCH ULTRA TEST test strip USE TO TEST BLOOD SUGAR TWICE DAILY  150 each  11  . rosuvastatin (CRESTOR) 20 MG tablet Take 1 tablet (20 mg total) by mouth daily.  90 tablet  3   No current facility-administered medications on file prior to visit.    Allergies  Allergen Reactions  . Other     Tree nuts    Family History  Problem Relation Age of Onset  . Diabetes Father   . Cancer Neg Hx   . Heart disease Neg Hx   . Hyperlipidemia Neg Hx   . Hypertension Neg Hx   . Diabetes Mother   . Diabetes Brother     one brother  diabetic   BP 128/80  Pulse 74  Ht 6\' 4"  (1.93 m)  Wt 340 lb (154.223 kg)  BMI 41.4 kg/m2  SpO2 98%  Review of Systems denies LOC.  He has gained weight    Objective:   Physical Exam VITAL SIGNS:  See vs page.   GENERAL: no distress.  Obese.   SKIN:  Insulin injection sites at the anterior thighs are normal.  Lab Results  Component Value Date   HGBA1C 15.5* 08/28/2012      Assessment & Plan:  DM: This insulin regimen was chosen from multiple options, for its simplicity.  The benefits of glycemic control must be weighed against the risks of hypoglycemia.

## 2012-11-28 ENCOUNTER — Ambulatory Visit: Payer: BC Managed Care – PPO

## 2012-12-02 ENCOUNTER — Ambulatory Visit: Payer: BC Managed Care – PPO | Admitting: Internal Medicine

## 2012-12-02 DIAGNOSIS — Z0289 Encounter for other administrative examinations: Secondary | ICD-10-CM

## 2012-12-10 ENCOUNTER — Ambulatory Visit (INDEPENDENT_AMBULATORY_CARE_PROVIDER_SITE_OTHER): Payer: BC Managed Care – PPO | Admitting: Internal Medicine

## 2012-12-10 ENCOUNTER — Encounter: Payer: Self-pay | Admitting: Internal Medicine

## 2012-12-10 ENCOUNTER — Ambulatory Visit (INDEPENDENT_AMBULATORY_CARE_PROVIDER_SITE_OTHER): Payer: BC Managed Care – PPO

## 2012-12-10 VITALS — BP 114/82 | HR 64 | Temp 98.5°F | Resp 16 | Ht 76.0 in | Wt 327.0 lb

## 2012-12-10 DIAGNOSIS — J4541 Moderate persistent asthma with (acute) exacerbation: Secondary | ICD-10-CM

## 2012-12-10 DIAGNOSIS — E781 Pure hyperglyceridemia: Secondary | ICD-10-CM

## 2012-12-10 DIAGNOSIS — IMO0001 Reserved for inherently not codable concepts without codable children: Secondary | ICD-10-CM

## 2012-12-10 DIAGNOSIS — I10 Essential (primary) hypertension: Secondary | ICD-10-CM

## 2012-12-10 DIAGNOSIS — Z6841 Body Mass Index (BMI) 40.0 and over, adult: Secondary | ICD-10-CM

## 2012-12-10 DIAGNOSIS — J3089 Other allergic rhinitis: Secondary | ICD-10-CM

## 2012-12-10 DIAGNOSIS — J309 Allergic rhinitis, unspecified: Secondary | ICD-10-CM

## 2012-12-10 DIAGNOSIS — J45901 Unspecified asthma with (acute) exacerbation: Secondary | ICD-10-CM

## 2012-12-10 DIAGNOSIS — J4531 Mild persistent asthma with (acute) exacerbation: Secondary | ICD-10-CM

## 2012-12-10 DIAGNOSIS — J302 Other seasonal allergic rhinitis: Secondary | ICD-10-CM

## 2012-12-10 DIAGNOSIS — E785 Hyperlipidemia, unspecified: Secondary | ICD-10-CM | POA: Insufficient documentation

## 2012-12-10 LAB — BASIC METABOLIC PANEL
BUN: 19 mg/dL (ref 6–23)
CO2: 28 mEq/L (ref 19–32)
Chloride: 101 mEq/L (ref 96–112)
Glucose, Bld: 202 mg/dL — ABNORMAL HIGH (ref 70–99)
Potassium: 4.3 mEq/L (ref 3.5–5.1)

## 2012-12-10 LAB — LIPID PANEL
Cholesterol: 170 mg/dL (ref 0–200)
HDL: 34 mg/dL — ABNORMAL LOW (ref >39.00)
Triglycerides: 277 mg/dL — ABNORMAL HIGH (ref 0.0–149.0)
VLDL: 55.4 mg/dL — ABNORMAL HIGH (ref 0.0–40.0)

## 2012-12-10 MED ORDER — MOMETASONE FUROATE 50 MCG/ACT NA SUSP: 4.0000 | Freq: Every day | NASAL | Status: DC

## 2012-12-10 MED ORDER — MOMETASONE FURO-FORMOTEROL FUM 200-5 MCG/ACT IN AERO: 2.0000 | INHALATION_SPRAY | Freq: Two times a day (BID) | RESPIRATORY_TRACT | Status: DC

## 2012-12-10 NOTE — Assessment & Plan Note (Signed)
His BP is well controlled 

## 2012-12-10 NOTE — Assessment & Plan Note (Signed)
He has had a very high A1C Fortunately he has been serious about lifestyle modifications so I hope this has improved Will recheck his A1C today and will address if needed, will also monitor his renal function

## 2012-12-10 NOTE — Assessment & Plan Note (Signed)
He HAS lost weight Will recheck his trigs today, if elevated will consider treatment

## 2012-12-10 NOTE — Progress Notes (Signed)
Subjective:    Patient ID: Mark Oconnor, male    DOB: November 16, 1980, 32 y.o.   MRN: [id number]  Asthma He complains of cough, difficulty breathing and wheezing. There is no chest tightness, frequent throat clearing, hemoptysis, hoarse voice, shortness of breath or sputum production. This is a recurrent problem. The current episode started more than 1 month ago. The problem occurs intermittently. The problem has been unchanged. The cough is non-productive. Associated symptoms include nasal congestion, postnasal drip and rhinorrhea. Pertinent negatives include no appetite change, chest pain, dyspnea on exertion, ear congestion, ear pain, fever, headaches, heartburn, malaise/fatigue, myalgias, orthopnea, PND, sneezing, sore throat, sweats, trouble swallowing or weight loss. His symptoms are aggravated by nothing. His symptoms are alleviated by nothing. He reports no improvement on treatment. His past medical history is significant for asthma.      Review of Systems  Constitutional: Positive for fatigue. Negative for fever, chills, weight loss, malaise/fatigue, diaphoresis, activity change, appetite change and unexpected weight change.  HENT: Positive for congestion, rhinorrhea and postnasal drip. Negative for ear pain, nosebleeds, sore throat, hoarse voice, facial swelling, sneezing, trouble swallowing, voice change and sinus pressure.   Eyes: Negative.   Respiratory: Positive for apnea, cough and wheezing. Negative for hemoptysis, sputum production, choking, chest tightness, shortness of breath and stridor.   Cardiovascular: Negative.  Negative for chest pain, dyspnea on exertion, palpitations, leg swelling and PND.  Gastrointestinal: Negative.  Negative for heartburn, nausea, vomiting, abdominal pain, diarrhea and constipation.  Endocrine: Negative.   Genitourinary: Negative.   Musculoskeletal: Negative.  Negative for myalgias, back pain, joint swelling, arthralgias and gait problem.  Skin:  Negative.   Allergic/Immunologic: Negative.   Neurological: Negative for dizziness, tremors, speech difficulty, weakness, light-headedness and headaches.  Hematological: Negative.  Negative for adenopathy. Does not bruise/bleed easily.  Psychiatric/Behavioral: Negative.        Objective:   Physical Exam  Vitals reviewed. Constitutional: He is oriented to person, place, and time. He appears well-developed and well-nourished.  Non-toxic appearance. He does not have a sickly appearance. He does not appear ill. No distress.  HENT:  Head: Normocephalic and atraumatic.  Mouth/Throat: Oropharynx is clear and moist. No oropharyngeal exudate.  Eyes: Conjunctivae are normal. Right eye exhibits no discharge. Left eye exhibits no discharge. No scleral icterus.  Neck: Normal range of motion. Neck supple. No JVD present. No tracheal deviation present. No thyromegaly present.  Cardiovascular: Normal rate, regular rhythm, normal heart sounds and intact distal pulses.  Exam reveals no gallop and no friction rub.   No murmur heard. Pulmonary/Chest: Effort normal and breath sounds normal. No accessory muscle usage or stridor. Not tachypneic. No respiratory distress. He has no decreased breath sounds. He has no wheezes. He has no rhonchi. He has no rales. He exhibits no tenderness.  Abdominal: Soft. Bowel sounds are normal. He exhibits no distension and no mass. There is no tenderness. There is no rebound and no guarding.  Musculoskeletal: Normal range of motion. He exhibits no edema and no tenderness.  Lymphadenopathy:    He has no cervical adenopathy.  Neurological: He is oriented to person, place, and time.  Skin: Skin is warm and dry. No rash noted. He is not diaphoretic. No erythema. No pallor.  Psychiatric: He has a normal mood and affect. His behavior is normal. Judgment and thought content normal.     Lab Results  Component Value Date   WBC 8.0 06/07/2012   HGB 16.2 06/07/2012   HCT 47.2  06/07/2012  PLT 273 06/07/2012   GLUCOSE 369* 08/28/2012   CHOL 261* 08/28/2012   TRIG 338.0* 08/28/2012   HDL 33.60* 08/28/2012   LDLDIRECT 162.0 08/28/2012   LDLCALC 202* 06/07/2012   ALT 18 08/28/2012   AST 13 08/28/2012   NA 134* 08/28/2012   K 4.0 08/28/2012   CL 98 08/28/2012   CREATININE 1.2 08/28/2012   BUN 12 08/28/2012   CO2 28 08/28/2012   TSH 0.72 08/28/2012   PSA 0.53 04/26/2011   HGBA1C 15.5* 08/28/2012       Assessment & Plan:

## 2012-12-10 NOTE — Assessment & Plan Note (Signed)
I will recheck his FLP today 

## 2012-12-10 NOTE — Assessment & Plan Note (Signed)
Documentation completed for weight loss management

## 2012-12-10 NOTE — Assessment & Plan Note (Signed)
He needs to restart inhalers for asthma - advair diskus is no longer covered by his insurance so will switch to [employer]

## 2012-12-10 NOTE — Patient Instructions (Signed)
Asthma, Adult Asthma is a condition that affects your lungs. It is characterized by swelling and narrowing of your airways as well as increased mucus production. The narrowing comes from swelling and muscle spasms inside the airways. When this happens, breathing can be difficult and you can have coughing, wheezing, and shortness of breath. Knowing more about asthma can help you manage it better. Asthma cannot be cured, but medicines and lifestyle changes can help control it. Asthma can be a minor problem for some people but if it is not controlled it can lead to a life-threatening asthma attack. Asthma can change over time. It is important to work with your caregiver to manage your asthma symptoms. CAUSES The exact cause of asthma is unknown. Asthma is believed to be caused by inherited (genetic) and environmental exposures. Swelling and redness (inflammation) of the airways occurs in asthma. This can be triggered by allergies, viral lung infections, or irritants in the air. Allergic reactions can cause you to wheeze immediately or several hours after an exposure. Asthma triggers are different for each person. It is important to pay attention and know what triggers your asthma.  Common triggers for asthma attacks include:  Animal dander from the skin, hair, or feathers of animals.  Dust mites contained in house dust.  Cockroaches.  Pollen from trees or grass.  Mold.  Cigarette or tobacco smoke. Smoking cannot be allowed in homes of people with asthma. People with asthma should not smoke and should not be around smokers.  Air pollutants such as dust, household cleaners, hair sprays, aerosol sprays, paint fumes, strong chemicals, or strong odors.  Cold air or weather changes. Cold air may cause inflammation. Winds increase molds and pollens in the air. There is not one best climate for people with asthma.  Strong emotions such as crying or laughing hard.  Stress.  Certain medicines such as  aspirin or beta-blockers.  Sulfites in such foods and drinks as dried fruits and wine.  Infections or inflammatory conditions such as the flu, a cold, or an inflammation of the nasal membranes (rhinitis).  Gastroesophageal reflux disease (GERD). GERD is a condition where stomach acid backs up into your throat (esophagus).  Exercise or strenous activity. Proper pre-exercise medicines allow most people to participate in sports. SYMPTOMS  Feeling short of breath.  Chest tightness or pain.  Difficulty sleeping due to coughing, wheezing, or feeling short of breath.  A whistling or wheezing sound with exhalation.  Coughing or wheezing that is worse when you:  Have a virus (such as a cold or the flu).  Are suffering from allergies.  Are exposed to certain fumes or chemicals.  Exercise. Signs that your asthma is probably getting worse include:   More frequent and bothersome asthma signs and symptoms.  Increasing difficulty breathing. This can be measured by a peak flow meter, which is a simple device used to check how well your lungs are working.  An increasingly frequent need to use a quick-relief inhaler. DIAGNOSIS  The diagnosis of asthma is made by review of your medical history, a physical exam, and possibly from other tests. Lung function studies may help with the diagnosis. TREATMENT  Asthma cannot be cured. However, for the majority of adults, asthma can be controlled with treatment. Besides avoidance of triggers of your asthma, medicines are often required. There are 2 classes of medicine used for asthma treatment: controller medicines (reduce inflammation and symptoms) andreliever or rescue medicines (relieve asthma symptoms during acute attacks). You may require daily   medicines to control your asthma. The most effective long-term controller medicines for asthma are inhaled corticosteroids (blocks inflammation). Other long-term control medicines include:  Leukotriene  receptor antagonists (blocks a pathway of inflammation).  Long-acting beta2-agonists (relaxes the muscles of the airways for at least 12 hours) with an inhaled corticosteroid.  Cromolyn sodium or nedocromil (alters certain inflammatory cells' ability to release chemicals that cause inflammation).  Immunomodulators (alters the immune system to prevent asthma symptoms).  Theophylline (relaxes muscles in the airways). You may also require a short-acting beta2-agonist to relieve asthma symptoms during an acute attack. You should understand what to do during an acute attack. Inhaled medicines are effective when used properly. Read the instructions on how to use your medicines correctly and speak to your caregiver if you have questions. Follow up with your caregiver on a regular basis to make sure your asthma is well-controlled. If your asthma is not well-controlled, if you have been hospitalized for asthma, or if multiple medicines or medium to high doses of inhaled corticosteroids are needed to control your asthma, request a referral to an asthma specialist. HOME CARE INSTRUCTIONS   Take medicines as directed by your caregiver.  Control your home environment in the following ways to help prevent asthma attacks:  Change your heating and air conditioning filter at least once a month.  Place a filter or cheesecloth over your heating and air conditioning vents.  Limit the use of fireplaces and wood stoves.  Do not smoke. Do not stay in places where others are smoking.  Get rid of pests (such as roaches and mice) and their droppings.  If you see mold on a plant, throw it away.  Clean your floors and dust every week. Use unscented cleaning products. Use a vacuum cleaner with a HEPA filter if possible. If vacuuming or cleaning triggers your asthma, try to find someone else to do these chores.  Floors in your house should be wood, tile, or vinyl. Carpet can trap dander and dust.  Use  allergy-proof pillows, mattress covers, and box spring covers.  Wash bedsheets and blankets every week in hot water and dry in a dryer.  Use a blanket that is made of polyester or cotton with a tight nap.  Do not use a dust ruffle on your bed.  Clean bathrooms and kitchens with bleach and repaint with mold-resistant paint.  Wash hands frequently.  Talk to your caregiver about an action plan for managing asthma attacks. This includes the use of a peak flow meter which measures the severity of the attack and medicines that can help stop the attack. An action plan can help minimize or stop the attack without having to seek medical care.  Remain calm during an asthma attack.  Always have a plan prepared for seeking medical attention. This should include contacting your caregiver and in the case of a severe attack, calling your local emergency services (911 in [place] SEEK MEDICAL CARE IF:   You have wheezing, shortness of breath, or a cough even if taking medicine to prevent attacks.  You have thickening of sputum.  Your sputum changes from clear or white to yellow, green, gray, or bloody.  You have any problems that may be related to the medicines you are taking (such as a rash, itching, swelling, or trouble breathing).  You are using a reliever medicine more than 2 3 times per week.  Your peak flow is still at 50 79% of personal best after following your action plan for 1   hour. SEEK IMMEDIATE MEDICAL CARE IF:   You are short of breath even at rest.  You get short of breath when doing very little physical activity.  You have difficulty eating, drinking, or talking due to asthma symptoms.  You have chest pain or you feel that your heart is beating fast.  You have a bluish color to your lips or fingernails.  You are lightheaded, dizzy, or faint.  You have a fever or persistent symptoms for more than 2 3 days.  You have a fever and symptoms suddenly get worse.  You seem to be  getting worse and are unresponsive to treatment during an asthma attack.  Your peak flow is less than 50% of personal best. MAKE SURE YOU:   Understand these instructions.  Will watch your condition.  Will get help right away if you are not doing well or get worse. Document Released: 04/24/2005 Document Revised: 04/10/2012 Document Reviewed: 12/11/2007 Exit[employer] Patient Information 2014 [employer].  

## 2012-12-10 NOTE — Assessment & Plan Note (Signed)
I have asked him to treat this with nasonex ns

## 2012-12-12 ENCOUNTER — Ambulatory Visit: Payer: BC Managed Care – PPO | Admitting: Internal Medicine

## 2012-12-14 ENCOUNTER — Other Ambulatory Visit: Payer: Self-pay | Admitting: Endocrinology

## 2012-12-16 ENCOUNTER — Other Ambulatory Visit: Payer: Self-pay | Admitting: *Deleted

## 2012-12-16 NOTE — Telephone Encounter (Signed)
Opened encounter in error  

## 2012-12-17 ENCOUNTER — Other Ambulatory Visit: Payer: Self-pay | Admitting: Internal Medicine

## 2012-12-18 ENCOUNTER — Telehealth: Payer: Self-pay | Admitting: *Deleted

## 2012-12-18 NOTE — Telephone Encounter (Signed)
Pt called requesting the weight loss sheet be faxed to Surgical Coordinator.

## 2012-12-31 ENCOUNTER — Telehealth: Payer: Self-pay | Admitting: *Deleted

## 2012-12-31 NOTE — Telephone Encounter (Signed)
Pt called requesting a letter for weight loss surgery.  Please advise

## 2012-12-31 NOTE — Telephone Encounter (Signed)
LA - can you help with this? 

## 2013-01-01 ENCOUNTER — Encounter: Payer: Self-pay | Admitting: Internal Medicine

## 2013-01-01 NOTE — Telephone Encounter (Signed)
Letter completed, given to TJ for signature, will notify patient when letter is ready for pick up

## 2013-01-26 ENCOUNTER — Other Ambulatory Visit: Payer: Self-pay | Admitting: Endocrinology

## 2013-01-27 ENCOUNTER — Other Ambulatory Visit: Payer: Self-pay | Admitting: *Deleted

## 2013-01-27 MED ORDER — INSULIN LISPRO 100 UNIT/ML (KWIKPEN): PEN_INJECTOR | SUBCUTANEOUS | Status: DC

## 2013-04-14 ENCOUNTER — Telehealth: Payer: Self-pay | Admitting: Dietician

## 2013-04-14 NOTE — Telephone Encounter (Signed)
Sent Rafik the Pre-Op diet to follow before Bariatric Surgery and the Post-Op diet to follow for two weeks after surgery via email along with instruction to contact me with questions.

## 2013-04-18 ENCOUNTER — Other Ambulatory Visit: Payer: Self-pay | Admitting: Endocrinology

## 2013-05-09 NOTE — Progress Notes (Signed)
Dr. Johna SheriffHoxworth  -  [facility]  Thanks.

## 2013-05-15 ENCOUNTER — Encounter: Payer: BC Managed Care – PPO | Attending: General Surgery

## 2013-05-15 ENCOUNTER — Encounter (HOSPITAL_COMMUNITY): Payer: Self-pay | Admitting: Pharmacy Technician

## 2013-05-16 ENCOUNTER — Encounter (INDEPENDENT_AMBULATORY_CARE_PROVIDER_SITE_OTHER): Payer: Self-pay | Admitting: General Surgery

## 2013-05-16 ENCOUNTER — Ambulatory Visit (INDEPENDENT_AMBULATORY_CARE_PROVIDER_SITE_OTHER): Payer: BC Managed Care – PPO | Admitting: General Surgery

## 2013-05-16 ENCOUNTER — Encounter (INDEPENDENT_AMBULATORY_CARE_PROVIDER_SITE_OTHER): Payer: Self-pay

## 2013-05-16 VITALS — BP 136/78 | HR 76 | Temp 98.0°F | Resp 18 | Ht 75.5 in | Wt 327.0 lb

## 2013-05-16 DIAGNOSIS — Z6841 Body Mass Index (BMI) 40.0 and over, adult: Secondary | ICD-10-CM

## 2013-05-16 NOTE — Progress Notes (Signed)
Pre-Operative Nutrition Class:  Appt start time: 1730   End time:  1930  Patient was seen on 05/15/2012 for Pre-Operative Bariatric Surgery Education at [facility].   Surgery date: 05/26/2012 Surgery type: Gastric Bypass  The following the learning objectives were met by the patient during this course:  Identify Pre-Op Dietary Goals and will begin 2 weeks pre-operatively  Identify appropriate sources of fluids and proteins   State protein recommendations and appropriate sources pre and post-operatively  Identify Post-Operative Dietary Goals and will follow for 2 weeks post-operatively  Identify appropriate multivitamin and calcium sources  Describe the need for physical activity post-operatively and will follow MD recommendations  State when to call healthcare provider regarding medication questions or post-operative complications  Handouts given during class include:  Pre-Op Bariatric Surgery Diet Handout  Protein Shake Handout  Post-Op Bariatric Surgery Nutrition Handout  BELT Program Information Flyer  Support Group Information Flyer  [facility] Bariatric Supplements Price List  Follow-Up Plan: Patient will follow-up at [facility] 2 weeks post operatively for diet advancement per MD.

## 2013-05-16 NOTE — Progress Notes (Signed)
Chief complaint: Preop gastric bypass  History: Patient returns for his preop visit prior to laparoscopic Roux-en-Y gastric bypass for morbid obesity with comorbidities of severe insulin-dependent diabetes mellitus as well as joint pain and hyperlipidemia and hypertension and GERD. He is complete his preoperative workup. No concerns also like a nutrition evaluation. Abdominal ultrasound was negative. Laboratory significant for changes related to diabetes and hypertension.  Past Medical History  Diagnosis Date  . Diabetes mellitus   . Hyperlipidemia   . Hypertension   . Morbid obesity   . GERD (gastroesophageal reflux disease)    Past Surgical History  Procedure Laterality Date  . Breath tek h pylori N/A 07/12/2012    Procedure: BREATH TEK H PYLORI;  Surgeon: Clarece Drzewiecki T Merlina Marchena, MD;  Location: [facility];  Service: General;  Laterality: N/A;   Current Outpatient Prescriptions  Medication Sig Dispense Refill  . Canagliflozin 300 MG TABS Take 1 tablet by mouth daily with breakfast.      . glucose blood (ONE TOUCH ULTRA TEST) test strip USE TO TEST BLOOD SUGAR TWICE DAILY      . insulin glargine (LANTUS) 100 UNIT/ML injection Inject 150 Units into the skin daily.      . montelukast (SINGULAIR) 10 MG tablet Take 10 mg by mouth at bedtime.      . Olmesartan-Amlodipine-HCTZ 40-5-12.5 MG TABS Take 1 tablet by mouth daily with breakfast.      . rosuvastatin (CRESTOR) 20 MG tablet Take 20 mg by mouth at bedtime.      . albuterol (PROVENTIL HFA;VENTOLIN HFA) 108 (90 BASE) MCG/ACT inhaler Inhale 2 puffs into the lungs every 6 (six) hours as needed for wheezing or shortness of breath.      . Liraglutide (VICTOZA) 18 MG/3ML SOLN Inject 1.8 mg into the skin daily.      . methocarbamol (ROBAXIN) 750 MG tablet Take 750 mg by mouth 4 (four) times daily.       No current facility-administered medications for this visit.   Allergies  Allergen Reactions  . Other Itching    Tree nuts    Exam: BP  136/78  Pulse 76  Temp(Src) 98 F (36.7 C)  Resp 18  Ht 6' 3.5" (1.918 m)  Wt 327 lb (148.326 kg)  BMI 40.32 kg/m2 General: Obese but otherwise well-appearing African male Skin: No rash or infection HEENT: Sclera nonicteric oropharynx clear Lungs: Clear breath sounds bilaterally Cardiac: Regular rate and rhythm no edema Abdomen: Soft and nontender  Assessment plan: Morbid obesity with multiple comorbidities including insulin-dependent diabetes mellitus and others as above. We discussed the procedure again a detailed review the consent form. All questions were answered. He has his preoperative bowel prep. Plan to proceed with laparoscopic Roux-en-Y gastric bypass. 

## 2013-05-20 NOTE — Progress Notes (Signed)
05-20-13 Need MD order entry in Epic. PAT appt. 05-21-13 0800 AM

## 2013-05-21 ENCOUNTER — Encounter (HOSPITAL_COMMUNITY)
Admission: RE | Admit: 2013-05-21 | Discharge: 2013-05-21 | Disposition: A | Discharge status: Home / Self Care | Payer: BC Managed Care – PPO | Source: Ambulatory Visit | Attending: General Surgery | Admitting: General Surgery

## 2013-05-21 ENCOUNTER — Other Ambulatory Visit: Payer: Self-pay | Admitting: Internal Medicine

## 2013-05-21 ENCOUNTER — Encounter (HOSPITAL_COMMUNITY): Payer: Self-pay

## 2013-05-21 DIAGNOSIS — G4730 Sleep apnea, unspecified: Secondary | ICD-10-CM

## 2013-05-21 DIAGNOSIS — Z01812 Encounter for preprocedural laboratory examination: Secondary | ICD-10-CM | POA: Insufficient documentation

## 2013-05-21 DIAGNOSIS — Z01818 Encounter for other preprocedural examination: Secondary | ICD-10-CM | POA: Insufficient documentation

## 2013-05-21 HISTORY — DX: Sleep apnea, unspecified: G47.30

## 2013-05-21 LAB — BASIC METABOLIC PANEL
BUN: 20 mg/dL (ref 6–23)
CO2: 25 mEq/L (ref 19–32)
Calcium: 9.9 mg/dL (ref 8.4–10.5)
Chloride: 97 mEq/L (ref 96–112)
Creatinine, Ser: 1.17 mg/dL (ref 0.50–1.35)
GFR calc Af Amer: >90 mL/min (ref >90)
GFR, EST NON AFRICAN AMERICAN: 81 mL/min — AB (ref >90)
GLUCOSE: 194 mg/dL — AB (ref 70–99)
POTASSIUM: 4.8 meq/L (ref 3.7–5.3)
Sodium: 135 mEq/L — ABNORMAL LOW (ref 137–147)

## 2013-05-21 LAB — CBC
HEMATOCRIT: 47.2 % (ref 39.0–52.0)
HEMOGLOBIN: 15.8 g/dL (ref 13.0–17.0)
MCH: 25.2 pg — AB (ref 26.0–34.0)
MCHC: 33.5 g/dL (ref 30.0–36.0)
MCV: 75.3 fL — ABNORMAL LOW (ref 78.0–100.0)
Platelets: 274 10*3/uL (ref 150–400)
RBC: 6.27 MIL/uL — ABNORMAL HIGH (ref 4.22–5.81)
RDW: 13.7 % (ref 11.5–15.5)
WBC: 7.3 10*3/uL (ref 4.0–10.5)

## 2013-05-21 NOTE — Progress Notes (Signed)
Your Pt has screened with an elevated risk for obstructive sleep apnea using the Stop-Bang tool during a presurgical  Visit. A score of four or greater is an elevated risk. 

## 2013-05-21 NOTE — Patient Instructions (Addendum)
[address] Anchors[address]ston C Laflam  05/21/2013   Your procedure is scheduled on:1-19   -2015  Report to [facility] at     0515   AM.  Call this number if you have problems the morning of surgery: [phone]  Or Presurgical Testing [phone](Dierdra Salameh) For Living Will and/or Health Care Power Attorney Forms: please provide copy for your medical record,may bring AM of surgery(Forms should be already notarized -we do not provide this service).  Remember: Follow any bowel prep instructions per MD office.    Do not eat food:After Midnight.   Take these medicines the morning of surgery with A SIP OF WATER: none. Reminder -Amlodipine 5mg  orally will be given on arrival to Short Stay. Do not take any Diabetic meds or insulin AM of. Consult Diabetic MD to consult on insulin use during bowel prep days.    Do not wear jewelry, make-up or nail polish.  Do not wear lotions, powders, or perfumes. You may wear deodorant.  Do not shave 12 hours prior to first CHG shower(legs and under arms).(face and neck okay.)  Do not bring valuables to the hospital.(Hospital is not responsible for lost valuables).  Contacts, dentures or removable bridgework, body piercing, hair pins may not be worn into surgery.  Leave suitcase in the car. After surgery it may be brought to your room.  For patients admitted to the hospital, checkout time is 11:00 AM the day of discharge.(Restricted visitors-Persons, age 33 or younger - may not visit at this time.)    Patients discharged the day of surgery will not be allowed to drive home. Must have responsible person with you x 24 hours once discharged.  Name and phone number of your driver: Chales AbrahamsSherrie Frye- significant [phone]- [phone] cell  Special Instructions: CHG(Chlorhedine 4%-"Hibiclens","Betasept","Aplicare") Shower Use Special Wash: see special instructions.(avoid face and genitals)   Please read over the following fact sheets that you were given:  Incentive Spirometry  Instruction.    Failure to follow these instructions may result in Cancellation of your surgery.   Patient signature_______________________________________________________

## 2013-05-21 NOTE — Pre-Procedure Instructions (Addendum)
05-21-13 EKG/ CXR 2'14- Epic. No MD order entry on PAT visit-requested x2.W.Taria Castrillo,RN 05-21-13 Portable equipment notified of need for [place].

## 2013-05-23 ENCOUNTER — Other Ambulatory Visit (INDEPENDENT_AMBULATORY_CARE_PROVIDER_SITE_OTHER): Payer: Self-pay | Admitting: General Surgery

## 2013-05-23 ENCOUNTER — Telehealth: Payer: Self-pay | Admitting: *Deleted

## 2013-05-23 NOTE — Telephone Encounter (Signed)
Please take no insulin the am of surgery.

## 2013-05-23 NOTE — Telephone Encounter (Signed)
Pt called and lvm stating he is having Gastric Bypass Surgery and he needs to know how much insulin he need to take the morning of the surgery. Please advise.

## 2013-05-23 NOTE — Telephone Encounter (Signed)
Pt informed

## 2013-05-23 NOTE — Progress Notes (Signed)
[facility] who is to arrive 05-26-13 at 0515 for his 0715 surgeryt. Spoke with Cyndra NumbersBernie and she will relay this message to Dr Johna SheriffHoxworth

## 2013-05-26 ENCOUNTER — Inpatient Hospital Stay (HOSPITAL_COMMUNITY): Payer: BC Managed Care – PPO | Admitting: Anesthesiology

## 2013-05-26 ENCOUNTER — Encounter (HOSPITAL_COMMUNITY)
Admission: RE | Disposition: A | Discharge status: Home / Self Care | Payer: Self-pay | Source: Ambulatory Visit | Attending: General Surgery

## 2013-05-26 ENCOUNTER — Encounter (HOSPITAL_COMMUNITY): Payer: BC Managed Care – PPO | Admitting: Anesthesiology

## 2013-05-26 ENCOUNTER — Inpatient Hospital Stay (HOSPITAL_COMMUNITY)
Admission: RE | Admit: 2013-05-26 | Discharge: 2013-05-28 | DRG: 621 | Disposition: A | Discharge status: Home / Self Care | Payer: BC Managed Care – PPO | Source: Ambulatory Visit | Attending: General Surgery | Admitting: General Surgery

## 2013-05-26 ENCOUNTER — Encounter (HOSPITAL_COMMUNITY): Payer: Self-pay | Admitting: *Deleted

## 2013-05-26 DIAGNOSIS — K219 Gastro-esophageal reflux disease without esophagitis: Secondary | ICD-10-CM | POA: Diagnosis present

## 2013-05-26 DIAGNOSIS — Z01812 Encounter for preprocedural laboratory examination: Secondary | ICD-10-CM

## 2013-05-26 DIAGNOSIS — Z794 Long term (current) use of insulin: Secondary | ICD-10-CM

## 2013-05-26 DIAGNOSIS — K2100 Gastro-esophageal reflux disease with esophagitis, without bleeding: Secondary | ICD-10-CM

## 2013-05-26 DIAGNOSIS — Z6841 Body Mass Index (BMI) 40.0 and over, adult: Secondary | ICD-10-CM

## 2013-05-26 DIAGNOSIS — E119 Type 2 diabetes mellitus without complications: Secondary | ICD-10-CM

## 2013-05-26 DIAGNOSIS — Z87891 Personal history of nicotine dependence: Secondary | ICD-10-CM

## 2013-05-26 DIAGNOSIS — E785 Hyperlipidemia, unspecified: Secondary | ICD-10-CM | POA: Diagnosis present

## 2013-05-26 DIAGNOSIS — I10 Essential (primary) hypertension: Secondary | ICD-10-CM | POA: Diagnosis present

## 2013-05-26 HISTORY — PX: GASTRIC ROUX-EN-Y: SHX5262

## 2013-05-26 LAB — GLUCOSE, CAPILLARY
GLUCOSE-CAPILLARY: 112 mg/dL — AB (ref 70–99)
GLUCOSE-CAPILLARY: 147 mg/dL — AB (ref 70–99)
Glucose-Capillary: 265 mg/dL — ABNORMAL HIGH (ref 70–99)
Glucose-Capillary: 183 mg/dL — ABNORMAL HIGH (ref 70–99)
Glucose-Capillary: 165 mg/dL — ABNORMAL HIGH (ref 70–99)

## 2013-05-26 LAB — HEMOGLOBIN AND HEMATOCRIT, BLOOD
HCT: 45.9 % (ref 39.0–52.0)
Hemoglobin: 15.4 g/dL (ref 13.0–17.0)

## 2013-05-26 SURGERY — LAPAROSCOPIC ROUX-EN-Y GASTRIC BYPASS WITH UPPER ENDOSCOPY
Anesthesia: General | Site: Abdomen

## 2013-05-26 MED ORDER — OXYCODONE-ACETAMINOPHEN 5-325 MG/5ML PO SOLN
5.0000 mL | ORAL | Status: DC | PRN
  Administered 2013-05-27 – 2013-05-28 (×3): 5 mL via ORAL
  Filled 2013-05-26 (×3): qty 5

## 2013-05-26 MED ORDER — ONDANSETRON HCL 4 MG/2ML IJ SOLN
INTRAMUSCULAR | Status: DC | PRN
  Administered 2013-05-26: 4 mg via INTRAVENOUS

## 2013-05-26 MED ORDER — FENTANYL CITRATE 0.05 MG/ML IJ SOLN
INTRAMUSCULAR | Status: AC
  Filled 2013-05-26: qty 5

## 2013-05-26 MED ORDER — LACTATED RINGERS IV SOLN
INTRAVENOUS | Status: DC | PRN
  Administered 2013-05-26 (×2): via INTRAVENOUS

## 2013-05-26 MED ORDER — CISATRACURIUM BESYLATE 20 MG/10ML IV SOLN
INTRAVENOUS | Status: AC
  Filled 2013-05-26: qty 20

## 2013-05-26 MED ORDER — NEOSTIGMINE METHYLSULFATE 1 MG/ML IJ SOLN
INTRAMUSCULAR | Status: DC | PRN
  Administered 2013-05-26: 4 mg via INTRAVENOUS

## 2013-05-26 MED ORDER — PROPOFOL 10 MG/ML IV BOLUS
INTRAVENOUS | Status: AC
  Filled 2013-05-26: qty 20

## 2013-05-26 MED ORDER — FENTANYL CITRATE 0.05 MG/ML IJ SOLN
INTRAMUSCULAR | Status: DC | PRN
  Administered 2013-05-26: 50 ug via INTRAVENOUS
  Administered 2013-05-26 (×2): 100 ug via INTRAVENOUS
  Administered 2013-05-26: 50 ug via INTRAVENOUS
  Administered 2013-05-26: 100 ug via INTRAVENOUS
  Administered 2013-05-26 (×2): 50 ug via INTRAVENOUS

## 2013-05-26 MED ORDER — UNJURY VANILLA POWDER: 2.0000 [oz_av] | Freq: Four times a day (QID) | ORAL | Status: DC

## 2013-05-26 MED ORDER — DEXTROSE 5 % IV SOLN
INTRAVENOUS | Status: AC
  Filled 2013-05-26 (×2): qty 1

## 2013-05-26 MED ORDER — HYDROMORPHONE HCL PF 1 MG/ML IJ SOLN
INTRAMUSCULAR | Status: AC
  Filled 2013-05-26: qty 1

## 2013-05-26 MED ORDER — ACETAMINOPHEN 160 MG/5ML PO SOLN: 650.0000 mg | ORAL | Status: DC | PRN

## 2013-05-26 MED ORDER — CEFOXITIN SODIUM 2 G IV SOLR
2.0000 g | INTRAVENOUS | Status: AC
  Administered 2013-05-26 (×2): 2 g via INTRAVENOUS
  Filled 2013-05-26: qty 2

## 2013-05-26 MED ORDER — AMLODIPINE BESYLATE 5 MG PO TABS
5.0000 mg | ORAL_TABLET | Freq: Once | ORAL | Status: AC
  Administered 2013-05-26: 5 mg via ORAL
  Filled 2013-05-26 (×2): qty 1

## 2013-05-26 MED ORDER — PROMETHAZINE HCL 25 MG/ML IJ SOLN: 6.2500 mg | INTRAMUSCULAR | Status: DC | PRN

## 2013-05-26 MED ORDER — ONDANSETRON HCL 4 MG/2ML IJ SOLN
INTRAMUSCULAR | Status: AC
  Filled 2013-05-26: qty 2

## 2013-05-26 MED ORDER — MIDAZOLAM HCL 2 MG/2ML IJ SOLN
INTRAMUSCULAR | Status: AC
  Filled 2013-05-26: qty 2

## 2013-05-26 MED ORDER — ALBUTEROL SULFATE (2.5 MG/3ML) 0.083% IN NEBU: 2.5000 mg | INHALATION_SOLUTION | Freq: Four times a day (QID) | RESPIRATORY_TRACT | Status: DC | PRN

## 2013-05-26 MED ORDER — DEXAMETHASONE SODIUM PHOSPHATE 10 MG/ML IJ SOLN
INTRAMUSCULAR | Status: DC | PRN
  Administered 2013-05-26: 10 mg via INTRAVENOUS

## 2013-05-26 MED ORDER — GLYCOPYRROLATE 0.2 MG/ML IJ SOLN
INTRAMUSCULAR | Status: DC | PRN
  Administered 2013-05-26: 0.6 mg via INTRAVENOUS

## 2013-05-26 MED ORDER — GLYCOPYRROLATE 0.2 MG/ML IJ SOLN
INTRAMUSCULAR | Status: AC
  Filled 2013-05-26: qty 3

## 2013-05-26 MED ORDER — INSULIN ASPART 100 UNIT/ML ~~LOC~~ SOLN
SUBCUTANEOUS | Status: AC
  Filled 2013-05-26: qty 1

## 2013-05-26 MED ORDER — PROPOFOL 10 MG/ML IV BOLUS
INTRAVENOUS | Status: DC | PRN
Start: 2013-05-26 — End: 2013-05-26
  Administered 2013-05-26: 200 mg via INTRAVENOUS
  Administered 2013-05-26: 50 mg via INTRAVENOUS

## 2013-05-26 MED ORDER — MIDAZOLAM HCL 5 MG/5ML IJ SOLN
INTRAMUSCULAR | Status: DC | PRN
  Administered 2013-05-26: 2 mg via INTRAVENOUS

## 2013-05-26 MED ORDER — UNJURY CHICKEN SOUP POWDER: 2.0000 [oz_av] | Freq: Four times a day (QID) | ORAL | Status: DC

## 2013-05-26 MED ORDER — SUCCINYLCHOLINE CHLORIDE 20 MG/ML IJ SOLN
INTRAMUSCULAR | Status: DC | PRN
  Administered 2013-05-26: 200 mg via INTRAVENOUS

## 2013-05-26 MED ORDER — BUPIVACAINE-EPINEPHRINE 0.25% -1:200000 IJ SOLN
INTRAMUSCULAR | Status: DC | PRN
  Administered 2013-05-26: 30 mL

## 2013-05-26 MED ORDER — LACTATED RINGERS IR SOLN
Status: DC | PRN
  Administered 2013-05-26: 1000 mL

## 2013-05-26 MED ORDER — BUPIVACAINE-EPINEPHRINE 0.25% -1:200000 IJ SOLN
INTRAMUSCULAR | Status: AC
  Filled 2013-05-26: qty 1

## 2013-05-26 MED ORDER — POTASSIUM CHLORIDE IN NACL 20-0.9 MEQ/L-% IV SOLN
INTRAVENOUS | Status: AC
  Filled 2013-05-26: qty 1000

## 2013-05-26 MED ORDER — HYDROMORPHONE HCL PF 1 MG/ML IJ SOLN
0.2500 mg | INTRAMUSCULAR | Status: DC | PRN
  Administered 2013-05-26 (×2): 0.5 mg via INTRAVENOUS

## 2013-05-26 MED ORDER — HEPARIN SODIUM (PORCINE) 5000 UNIT/ML IJ SOLN
5000.0000 [IU] | Freq: Three times a day (TID) | INTRAMUSCULAR | Status: DC
  Administered 2013-05-26 – 2013-05-28 (×5): 5000 [IU] via SUBCUTANEOUS
  Filled 2013-05-26 (×8): qty 1

## 2013-05-26 MED ORDER — 0.9 % SODIUM CHLORIDE (POUR BTL) OPTIME
TOPICAL | Status: DC | PRN
  Administered 2013-05-26: 1000 mL

## 2013-05-26 MED ORDER — CEFOXITIN SODIUM 1 G IV SOLR
INTRAVENOUS | Status: AC
  Filled 2013-05-26 (×2): qty 1

## 2013-05-26 MED ORDER — PROPOFOL 10 MG/ML IV BOLUS
INTRAVENOUS | Status: AC
Start: 2013-05-26 — End: 2013-05-26
  Filled 2013-05-26: qty 20

## 2013-05-26 MED ORDER — ONDANSETRON HCL 4 MG/2ML IJ SOLN: 4.0000 mg | INTRAMUSCULAR | Status: DC | PRN

## 2013-05-26 MED ORDER — MORPHINE SULFATE 2 MG/ML IJ SOLN
2.0000 mg | INTRAMUSCULAR | Status: DC | PRN
  Administered 2013-05-26 – 2013-05-27 (×6): 2 mg via INTRAVENOUS
  Filled 2013-05-26 (×6): qty 1

## 2013-05-26 MED ORDER — POTASSIUM CHLORIDE IN NACL 20-0.9 MEQ/L-% IV SOLN
INTRAVENOUS | Status: DC
  Administered 2013-05-26 – 2013-05-28 (×5): via INTRAVENOUS
  Filled 2013-05-26 (×8): qty 1000

## 2013-05-26 MED ORDER — INSULIN ASPART 100 UNIT/ML ~~LOC~~ SOLN
0.0000 [IU] | SUBCUTANEOUS | Status: DC
  Administered 2013-05-26: 3 [IU] via SUBCUTANEOUS
  Administered 2013-05-26: 2 [IU] via SUBCUTANEOUS
  Administered 2013-05-26: 8 [IU] via SUBCUTANEOUS
  Administered 2013-05-26: 3 [IU] via SUBCUTANEOUS
  Administered 2013-05-27 (×2): 2 [IU] via SUBCUTANEOUS
  Administered 2013-05-27: 3 [IU] via SUBCUTANEOUS
  Administered 2013-05-27 – 2013-05-28 (×4): 2 [IU] via SUBCUTANEOUS

## 2013-05-26 MED ORDER — HEPARIN SODIUM (PORCINE) 5000 UNIT/ML IJ SOLN
5000.0000 [IU] | INTRAMUSCULAR | Status: AC
  Administered 2013-05-26: 5000 [IU] via SUBCUTANEOUS
  Filled 2013-05-26: qty 1

## 2013-05-26 MED ORDER — UNJURY CHOCOLATE CLASSIC POWDER
2.0000 [oz_av] | Freq: Four times a day (QID) | ORAL | Status: DC
  Administered 2013-05-28: 2 [oz_av] via ORAL

## 2013-05-26 MED ORDER — CISATRACURIUM BESYLATE (PF) 10 MG/5ML IV SOLN
INTRAVENOUS | Status: DC | PRN
  Administered 2013-05-26: 10 mg via INTRAVENOUS
  Administered 2013-05-26 (×2): 4 mg via INTRAVENOUS
  Administered 2013-05-26: 6 mg via INTRAVENOUS
  Administered 2013-05-26: 4 mg via INTRAVENOUS
  Administered 2013-05-26: 10 mg via INTRAVENOUS

## 2013-05-26 MED ORDER — NEOSTIGMINE METHYLSULFATE 1 MG/ML IJ SOLN
INTRAMUSCULAR | Status: AC
  Filled 2013-05-26: qty 10

## 2013-05-26 MED ORDER — INSULIN GLARGINE 100 UNIT/ML ~~LOC~~ SOLN
50.0000 [IU] | Freq: Every day | SUBCUTANEOUS | Status: DC
  Administered 2013-05-26 – 2013-05-27 (×2): 50 [IU] via SUBCUTANEOUS
  Filled 2013-05-26 (×3): qty 0.5

## 2013-05-26 MED ORDER — ALBUTEROL SULFATE HFA 108 (90 BASE) MCG/ACT IN AERS: 2.0000 | INHALATION_SPRAY | Freq: Four times a day (QID) | RESPIRATORY_TRACT | Status: DC | PRN

## 2013-05-26 MED ORDER — TISSEEL VH 10 ML EX KIT
PACK | CUTANEOUS | Status: DC | PRN
  Administered 2013-05-26: 10 mL

## 2013-05-26 MED ORDER — DEXAMETHASONE SODIUM PHOSPHATE 10 MG/ML IJ SOLN
INTRAMUSCULAR | Status: AC
  Filled 2013-05-26: qty 1

## 2013-05-26 MED ORDER — TISSEEL VH 10 ML EX KIT
PACK | CUTANEOUS | Status: AC
  Filled 2013-05-26: qty 2

## 2013-05-26 SURGICAL SUPPLY — 71 items
APPLICATOR COTTON TIP 6IN STRL (MISCELLANEOUS) ×4 IMPLANT
APPLIER CLIP ROT 13.4 12 LRG (CLIP)
BLADE SURG SZ11 CARB STEEL (BLADE) ×2 IMPLANT
CABLE HIGH FREQUENCY MONO STRZ (ELECTRODE) ×2 IMPLANT
CANISTER SUCTION 2500CC (MISCELLANEOUS) ×2 IMPLANT
CHLORAPREP W/TINT 26ML (MISCELLANEOUS) ×2 IMPLANT
CLIP APPLIE ROT 13.4 12 LRG (CLIP) IMPLANT
CLIP SUT LAPRA TY ABSORB (SUTURE) ×4 IMPLANT
CUTTER LINEAR ENDO ART 45 ETS (STAPLE) ×2 IMPLANT
DECANTER SPIKE VIAL GLASS SM (MISCELLANEOUS) ×2 IMPLANT
DERMABOND ADVANCED (GAUZE/BANDAGES/DRESSINGS) ×1
DERMABOND ADVANCED .7 DNX12 (GAUZE/BANDAGES/DRESSINGS) ×1 IMPLANT
DEVICE SUTURE ENDOST 10MM (ENDOMECHANICALS) ×2 IMPLANT
DISSECTOR BLUNT TIP ENDO 5MM (MISCELLANEOUS) IMPLANT
DRAIN PENROSE 18X1/4 LTX STRL (WOUND CARE) ×2 IMPLANT
DRAPE CAMERA CLOSED 9X96 (DRAPES) ×2 IMPLANT
ELECT REM PT RETURN 9FT ADLT (ELECTROSURGICAL) ×2
ELECTRODE REM PT RTRN 9FT ADLT (ELECTROSURGICAL) ×1 IMPLANT
GAUZE SPONGE 4X4 16PLY XRAY LF (GAUZE/BANDAGES/DRESSINGS) ×2 IMPLANT
GLOVE BIOGEL M 8.0 STRL (GLOVE) ×2 IMPLANT
GLOVE BIOGEL PI IND STRL 7.0 (GLOVE) ×2 IMPLANT
GLOVE BIOGEL PI IND STRL 7.5 (GLOVE) ×1 IMPLANT
GLOVE BIOGEL PI INDICATOR 7.0 (GLOVE) ×2
GLOVE BIOGEL PI INDICATOR 7.5 (GLOVE) ×1
GLOVE SS BIOGEL STRL SZ 7.5 (GLOVE) ×1 IMPLANT
GLOVE SUPERSENSE BIOGEL SZ 7.5 (GLOVE) ×1
GOWN STRL REUS W/TWL XL LVL3 (GOWN DISPOSABLE) ×4 IMPLANT
HEMOSTAT SURGICEL 4X8 (HEMOSTASIS) IMPLANT
HOVERMATT SINGLE USE (MISCELLANEOUS) ×2 IMPLANT
KIT BASIN OR (CUSTOM PROCEDURE TRAY) ×2 IMPLANT
KIT GASTRIC LAVAGE 34FR ADT (SET/KITS/TRAYS/PACK) ×2 IMPLANT
MARKER SKIN DUAL TIP RULER LAB (MISCELLANEOUS) ×2 IMPLANT
NEEDLE SPNL 22GX3.5 QUINCKE BK (NEEDLE) ×2 IMPLANT
PACK CARDIOVASCULAR III (CUSTOM PROCEDURE TRAY) ×2 IMPLANT
POUCH SPECIMEN RETRIEVAL 10MM (ENDOMECHANICALS) IMPLANT
RELOAD 45 VASCULAR/THIN (ENDOMECHANICALS) ×2 IMPLANT
RELOAD BLUE (STAPLE) ×6 IMPLANT
RELOAD BLUE CHELON 45 (STAPLE) ×2 IMPLANT
RELOAD ENDO STITCH 2.0 (ENDOMECHANICALS) ×2
RELOAD GOLD (STAPLE) ×2 IMPLANT
RELOAD STAPLE TA45 3.5 REG BLU (ENDOMECHANICALS) ×4 IMPLANT
RELOAD WHITE ECR60W (STAPLE) ×2 IMPLANT
SCISSORS LAP 5X35 DISP (ENDOMECHANICALS) ×2 IMPLANT
SEALANT SURGICAL APPL DUAL CAN (MISCELLANEOUS) ×2 IMPLANT
SET IRRIG TUBING LAPAROSCOPIC (IRRIGATION / IRRIGATOR) ×2 IMPLANT
SHEARS CURVED HARMONIC AC 45CM (MISCELLANEOUS) ×2 IMPLANT
SLEEVE ADV FIXATION 12X100MM (TROCAR) ×4 IMPLANT
SOLUTION ANTI FOG 6CC (MISCELLANEOUS) ×2 IMPLANT
SPONGE GAUZE 4X4 12PLY (GAUZE/BANDAGES/DRESSINGS) ×2 IMPLANT
STAPLE ECHEON FLEX 60 POW ENDO (STAPLE) ×2 IMPLANT
STAPLER VISISTAT 35W (STAPLE) ×2 IMPLANT
SUT DVC SILK 2.0X39 (SUTURE) ×10 IMPLANT
SUT DVC VICRYL PGA 2.0X39 (SUTURE) ×14 IMPLANT
SUT ETHILON 3 0 PS 1 (SUTURE) IMPLANT
SUT MNCRL AB 4-0 PS2 18 (SUTURE) ×2 IMPLANT
SUT RELOAD ENDO STITCH 2 48X1 (ENDOMECHANICALS) ×2
SUT RELOAD ENDO STITCH 2.0 (ENDOMECHANICALS)
SUT VIC AB 2-0 SH 27 (SUTURE) ×1
SUT VIC AB 2-0 SH 27X BRD (SUTURE) ×1 IMPLANT
SUTURE RELOAD END STTCH 2 48X1 (ENDOMECHANICALS) ×2 IMPLANT
SUTURE RELOAD ENDO STITCH 2.0 (ENDOMECHANICALS) IMPLANT
SYR 20CC LL (SYRINGE) ×4 IMPLANT
SYR 50ML LL SCALE MARK (SYRINGE) ×2 IMPLANT
TOWEL OR 17X26 10 PK STRL BLUE (TOWEL DISPOSABLE) ×2 IMPLANT
TOWEL OR NON WOVEN STRL DISP B (DISPOSABLE) ×2 IMPLANT
TRAY FOLEY CATH 14FRSI W/METER (CATHETERS) ×2 IMPLANT
TROCAR ADV FIXATION 12X100MM (TROCAR) ×2 IMPLANT
TROCAR BLADELESS OPT 5 100 (ENDOMECHANICALS) ×2 IMPLANT
TROCAR XCEL 12X100 BLDLESS (ENDOMECHANICALS) ×2 IMPLANT
TUBING ENDO SMARTCAP (MISCELLANEOUS) ×2 IMPLANT
TUBING FILTER THERMOFLATOR (ELECTROSURGICAL) ×2 IMPLANT

## 2013-05-26 NOTE — Anesthesia Preprocedure Evaluation (Signed)
Anesthesia Evaluation  Patient identified by MRN, date of birth, ID band Patient awake    Reviewed: Allergy & Precautions, H&P , NPO status , Patient's Chart, lab work & pertinent test results  Airway Mallampati: III TM Distance: <3 FB Neck ROM: Full    Dental no notable dental hx.    Pulmonary neg pulmonary ROS, former smoker,  breath sounds clear to auscultation  Pulmonary exam normal       Cardiovascular hypertension, Pt. on medications Rhythm:Regular Rate:Normal     Neuro/Psych negative neurological ROS  negative psych ROS   GI/Hepatic negative GI ROS, Neg liver ROS,   Endo/Other  diabetes, Insulin DependentMorbid obesity  Renal/GU negative Renal ROS  negative genitourinary   Musculoskeletal negative musculoskeletal ROS (+)   Abdominal   Peds negative pediatric ROS (+)  Hematology negative hematology ROS (+)   Anesthesia Other Findings   Reproductive/Obstetrics negative OB ROS                           Anesthesia Physical Anesthesia Plan  ASA: III  Anesthesia Plan: General   Post-op Pain Management:    Induction: Intravenous  Airway Management Planned: Oral ETT  Additional Equipment:   Intra-op Plan:   Post-operative Plan: Extubation in OR  Informed Consent: I have reviewed the patients History and Physical, chart, labs and discussed the procedure including the risks, benefits and alternatives for the proposed anesthesia with the patient or authorized representative who has indicated his/her understanding and acceptance.   Dental advisory given  Plan Discussed with: CRNA and Surgeon  Anesthesia Plan Comments:         Anesthesia Quick Evaluation

## 2013-05-26 NOTE — Op Note (Signed)
Preop diagnosis: Morbid obesity  Postop diagnosis: Morbid obesity  Body mass index is 40.65 kg/(m^2).  Surgical procedure: Laparoscopic Roux-en-Y gastric bypass  Surgeon: Sharlet SalinaBenjamin T.Anajah Sterbenz M.D.  Asst.: Luretha MurphyMatthew Martin M.D.  Anesthesia: General  Complications:  None  EBL: Minimal  Drains: None  Disposition: PACU in good condition  Description of procedure: Patient is brought to the operating room and general anesthesia induced. She had received preoperative broad-spectrum IV antibiotics and subcutaneous heparin. The abdomen was widely sterilely prepped and draped. Patient timeout was performed and correct patient and procedure confirmed. Access was obtained with a 12 mm Optiview trocar in the left upper quadrant and pneumoperitoneum established without difficulty. Under direct vision 12 mm trocars were placed laterally in the right upper quadrant, right upper quadrant midclavicular line, and to the left and above the umbilicus for the camera port. A 5 mm trocar was placed laterally in the left upper quadrant. The omentum was brought into the upper abdomen and the transverse mesocolon elevated and the ligament of Treitz clearly identified. A 40 cm biliopancreatic limb was then carefully measured from the ligament of Treitz. The small intestine was divided at this point with a single firing of the white load linear stapler. A Penrose drain was sutured to the end of the Roux-en-Y limb for later identification. A 100 cm Roux-en-Y limb was then carefully measured. At this point a side-to-side anastomosis was created between the Roux limb and the end of the biliopancreatic limb. This was accomplished with a single firing of the 45 mm white load linear stapler. The common enterotomy was closed with a running 2-0 Vicryl begun at either end of the enterotomy and tied centrally. The mesenteric defect was then closed with running 2-0 silk. The omentum was then divided with the harmonic scalpel up towards  the transverse colon to allow mobility of the Roux limb toward the gastric pouch. The patient was then placed in steep reversed Trendelenburg. Through a 5 mm subxiphoid site the [facility] was placed and the left lobe of the liver elevated with excellent exposure of the upper stomach and hiatus. The angle of Hiss was then mobilized with the harmonic scalpel. A 4 cm gastric pouch was then carefully measured along the lesser curve of the stomach. Dissection was carried along the lesser curve at this point with the Harmonic scalpel working carefully back toward the lesser sac at right angles to the lesser curve. The free lesser sac was then entered. After being sure all tubes were removed from the stomach an initial firing of the gold load 60 mm linear stapler was fired at right angles across the lesser curve for about 4 cm. The gastric pouch was further mobilized posteriorly and then the pouch was completed with 2 further firings of the 60 mm blue load linear stapler up through the previously dissected angle of His. It was ensured that the pouch was completely mobilized away from the gastric remnant. This created a nice tubular 4-5 cm gastric pouch. The staple line of the gastric remnant was then oversewn with 2-0 silk for hemostasis. The Roux limb was then brought up in an antecolic fashion with the candycane facing to the patient's left without undue tension. The gastrojejunostomy was created with an initial posterior row of 2-0 Vicryl between the Roux limb and the staple line of the gastric pouch. Enterotomies were then made in the gastric pouch and the Roux limb with the harmonic scalpel and at approximately 2-2-1/2 cm anastomosis was created with a single  firing of the blue load linear stapler. The staple line was inspected and was intact without bleeding. The common enterotomy was then closed with running 2-0 Vicryl begun at either end and tied centrally. The wall tube was then easily passed through the  anastomosis and an outer anterior layer of running 2-0 Vicryl was placed. The Ewald tube was removed. With the outlet of the gastrojejunostomy clamped and under saline irrigation the assistant performed upper endoscopy and with the gastric pouch tensely distended with air there was no evidence of leak. The pouch was desufflated. The Vonita Moss defect was closed with running 2-0 silk. The abdomen was inspected for any evidence of bleeding or bowel injury and everything looked fine. The Nathanson retractor was removed under direct vision after coating the anastomosis with Tisseel tissue sealant. All CO2 was evacuated and trochars removed. Skin incisions were closed with staples. Sponge needle and instrument counts were correct. The patient was taken to the PACU in good condition.     Mariella Saa MD, FACS  05/26/2013, 10:51 AM

## 2013-05-26 NOTE — Interval H&P Note (Signed)
History and Physical Interval Note:  05/26/2013 7:08 AM  Mark AnchorsWinston C Nosbisch  has presented today for surgery, with the diagnosis of morbid obesity   The various methods of treatment have been discussed with the patient and family. After consideration of risks, benefits and other options for treatment, the patient has consented to  Procedure(s): LAPAROSCOPIC ROUX-EN-Y GASTRIC BYPASS WITH UPPER ENDOSCOPY (N/A) as a surgical intervention .  The patient's history has been reviewed, patient examined, no change in status, stable for surgery.  I have reviewed the patient's chart and labs.  Questions were answered to the patient's satisfaction.     Cy Bresee T

## 2013-05-26 NOTE — Transfer of Care (Signed)
Immediate Anesthesia Transfer of Care Note  Patient: Mark AnchorsWinston C Gosa  Procedure(s) Performed: Procedure(s): LAPAROSCOPIC ROUX-EN-Y GASTRIC BYPASS WITH UPPER ENDOSCOPY (N/A)  Patient Location: PACU  Anesthesia Type:general  Level of Consciousness: awake and alert   Airway & Oxygen Therapy: Patient Spontanous Breathing and Patient connected to face mask oxygen  Post-op Assessment: Report given to PACU RN and Post -op Vital signs reviewed and stable  Post vital signs: Reviewed and stable  Complications: No apparent anesthesia complications

## 2013-05-26 NOTE — H&P (View-Only) (Signed)
Chief complaint: Preop gastric bypass  History: Patient returns for his preop visit prior to laparoscopic Roux-en-Y gastric bypass for morbid obesity with comorbidities of severe insulin-dependent diabetes mellitus as well as joint pain and hyperlipidemia and hypertension and GERD. He is complete his preoperative workup. No concerns also like a nutrition evaluation. Abdominal ultrasound was negative. Laboratory significant for changes related to diabetes and hypertension.  Past Medical History  Diagnosis Date  . Diabetes mellitus   . Hyperlipidemia   . Hypertension   . Morbid obesity   . GERD (gastroesophageal reflux disease)    Past Surgical History  Procedure Laterality Date  . Breath tek h pylori N/A 07/12/2012    Procedure: BREATH TEK H PYLORI;  Surgeon: Mariella SaaBenjamin T Arlene Brickel, MD;  [facility];  Service: General;  Laterality: N/A;   Current Outpatient Prescriptions  Medication Sig Dispense Refill  . Canagliflozin 300 MG TABS Take 1 tablet by mouth daily with breakfast.      . glucose blood (ONE TOUCH ULTRA TEST) test strip USE TO TEST BLOOD SUGAR TWICE DAILY      . insulin glargine (LANTUS) 100 UNIT/ML injection Inject 150 Units into the skin daily.      . montelukast (SINGULAIR) 10 MG tablet Take 10 mg by mouth at bedtime.      . Olmesartan-Amlodipine-HCTZ 40-5-12.5 MG TABS Take 1 tablet by mouth daily with breakfast.      . rosuvastatin (CRESTOR) 20 MG tablet Take 20 mg by mouth at bedtime.      Marland Kitchen. albuterol (PROVENTIL HFA;VENTOLIN HFA) 108 (90 BASE) MCG/ACT inhaler Inhale 2 puffs into the lungs every 6 (six) hours as needed for wheezing or shortness of breath.      . Liraglutide (VICTOZA) 18 MG/3ML SOLN Inject 1.8 mg into the skin daily.      . methocarbamol (ROBAXIN) 750 MG tablet Take 750 mg by mouth 4 (four) times daily.       No current facility-administered medications for this visit.   Allergies  Allergen Reactions  . Other Itching    Tree nuts    Exam: BP  136/78  Pulse 76  Temp(Src) 98 F (36.7 C)  Resp 18  Ht 6' 3.5" (1.918 m)  Wt 327 lb (148.326 kg)  BMI 40.32 kg/m2 General: Obese but otherwise well-appearing African male Skin: No rash or infection HEENT: Sclera nonicteric oropharynx clear Lungs: Clear breath sounds bilaterally Cardiac: Regular rate and rhythm no edema Abdomen: Soft and nontender  Assessment plan: Morbid obesity with multiple comorbidities including insulin-dependent diabetes mellitus and others as above. We discussed the procedure again a detailed review the consent form. All questions were answered. He has his preoperative bowel prep. Plan to proceed with laparoscopic Roux-en-Y gastric bypass.

## 2013-05-26 NOTE — Preoperative (Signed)
Beta Blockers   Reason not to administer Beta Blockers:Not Applicable 

## 2013-05-27 ENCOUNTER — Encounter (HOSPITAL_COMMUNITY): Payer: Self-pay | Admitting: General Surgery

## 2013-05-27 ENCOUNTER — Inpatient Hospital Stay (HOSPITAL_COMMUNITY): Payer: BC Managed Care – PPO

## 2013-05-27 LAB — GLUCOSE, CAPILLARY
GLUCOSE-CAPILLARY: 144 mg/dL — AB (ref 70–99)
GLUCOSE-CAPILLARY: 137 mg/dL — AB (ref 70–99)
GLUCOSE-CAPILLARY: 154 mg/dL — AB (ref 70–99)
Glucose-Capillary: 112 mg/dL — ABNORMAL HIGH (ref 70–99)
Glucose-Capillary: 137 mg/dL — ABNORMAL HIGH (ref 70–99)
Glucose-Capillary: 147 mg/dL — ABNORMAL HIGH (ref 70–99)

## 2013-05-27 LAB — CBC WITH DIFFERENTIAL/PLATELET
Basophils Absolute: 0 10*3/uL (ref 0.0–0.1)
Basophils Relative: 0 % (ref 0–1)
EOS PCT: 0 % (ref 0–5)
Eosinophils Absolute: 0 10*3/uL (ref 0.0–0.7)
HEMATOCRIT: 45.1 % (ref 39.0–52.0)
Hemoglobin: 14.5 g/dL (ref 13.0–17.0)
LYMPHS PCT: 17 % (ref 12–46)
Lymphs Abs: 1.9 10*3/uL (ref 0.7–4.0)
MCH: 24.6 pg — ABNORMAL LOW (ref 26.0–34.0)
MCHC: 32.2 g/dL (ref 30.0–36.0)
MCV: 76.6 fL — AB (ref 78.0–100.0)
MONO ABS: 1 10*3/uL (ref 0.1–1.0)
Monocytes Relative: 9 % (ref 3–12)
Neutro Abs: 8 10*3/uL — ABNORMAL HIGH (ref 1.7–7.7)
Neutrophils Relative %: 74 % (ref 43–77)
Platelets: 275 10*3/uL (ref 150–400)
RBC: 5.89 MIL/uL — AB (ref 4.22–5.81)
RDW: 14.2 % (ref 11.5–15.5)
WBC: 10.8 10*3/uL — AB (ref 4.0–10.5)

## 2013-05-27 LAB — HEMOGLOBIN AND HEMATOCRIT, BLOOD
HCT: 45.7 % (ref 39.0–52.0)
Hemoglobin: 15.2 g/dL (ref 13.0–17.0)

## 2013-05-27 MED ORDER — IOHEXOL 300 MG/ML  SOLN
50.0000 mL | Freq: Once | INTRAMUSCULAR | Status: AC | PRN
  Administered 2013-05-27: 50 mL via ORAL

## 2013-05-27 MED ORDER — AMLODIPINE BESYLATE 5 MG PO TABS
5.0000 mg | ORAL_TABLET | Freq: Every day | ORAL | Status: DC
  Administered 2013-05-27 – 2013-05-28 (×2): 5 mg via ORAL
  Filled 2013-05-27 (×2): qty 1

## 2013-05-27 MED ORDER — IRBESARTAN 300 MG PO TABS
300.0000 mg | ORAL_TABLET | Freq: Every day | ORAL | Status: DC
  Administered 2013-05-27 – 2013-05-28 (×2): 300 mg via ORAL
  Filled 2013-05-27 (×2): qty 1

## 2013-05-27 MED ORDER — HYDROCHLOROTHIAZIDE 12.5 MG PO CAPS
12.5000 mg | ORAL_CAPSULE | Freq: Every day | ORAL | Status: DC
  Administered 2013-05-27 – 2013-05-28 (×2): 12.5 mg via ORAL
  Filled 2013-05-27 (×2): qty 1

## 2013-05-27 NOTE — Progress Notes (Signed)
Patient ID: Mark Oconnor, male   DOB: 05/02/1981, 33 y.o.   MRN: [id number] 1 Day Post-Op  Subjective: Mild pain controlled with meds, Occ slight nausea with pain meds only  Objective: Vital signs in last 24 hours: Temp:  [98.1 F (36.7 C)-98.8 F (37.1 C)] 98.1 F (36.7 C) (01/20 0601) Pulse Rate:  [57-90] 87 (01/20 0601) Resp:  [14-20] 20 (01/20 0601) BP: (132-162)/(70-104) 132/83 mmHg (01/20 0601) SpO2:  [97 %-100 %] 100 % (01/20 0601) Last BM Date: 05/26/13  Intake/Output from previous day: 01/19 0701 - 01/20 0700 In: 4491.7 [I.V.:4491.7] Out: 2325 [Urine:2275; Blood:50] Intake/Output this shift:    General appearance: alert, cooperative and no distress GI: normal findings: soft, non-tender Incision/Wound: Clean and dry  Lab Results:   Recent Labs  05/26/13 1911 05/27/13 0450  WBC  --  10.8*  HGB 15.4 14.5  HCT 45.9 45.1  PLT  --  275   BMET No results found for this basename: NA, K, CL, CO2, GLUCOSE, BUN, CREATININE, CALCIUM,  in the last 72 hours   Studies/Results: No results found.  Anti-infectives: Anti-infectives   Start     Dose/Rate Route Frequency Ordered Stop   05/26/13 0527  cefOXitin (MEFOXIN) 2 g in dextrose 5 % 50 mL IVPB     2 g 100 mL/hr over 30 Minutes Intravenous On call to O.R. 05/26/13 0527 05/26/13 0939      Assessment/Plan: s/p Procedure(s): LAPAROSCOPIC ROUX-EN-Y GASTRIC BYPASS WITH UPPER ENDOSCOPY Doing very well without apparent complication For gastrograffin swallow this AM   LOS: 1 day    Ilina Xu T 05/27/2013

## 2013-05-27 NOTE — Progress Notes (Signed)
Spoke with patient about pain medicine, and nausea medicine, answered questions, specifically about incisions and liquid vs IV pain medicine.  All questions answered, will continue to monitor.

## 2013-05-27 NOTE — Anesthesia Postprocedure Evaluation (Signed)
  Anesthesia Post-op Note  Patient: Mark Oconnor  Procedure(s) Performed: Procedure(s) (LRB): LAPAROSCOPIC ROUX-EN-Y GASTRIC BYPASS WITH UPPER ENDOSCOPY (N/A)  Patient Location: PACU  Anesthesia Type: General  Level of Consciousness: awake and alert   Airway and Oxygen Therapy: Patient Spontanous Breathing  Post-op Pain: mild  Post-op Assessment: Post-op Vital signs reviewed, Patient's Cardiovascular Status Stable, Respiratory Function Stable, Patent Airway and No signs of Nausea or vomiting  Last Vitals:  Filed Vitals:   05/27/13 0601  BP: 132/83  Pulse: 87  Temp: 36.7 C  Resp: 20    Post-op Vital Signs: stable   Complications: No apparent anesthesia complications

## 2013-05-27 NOTE — Care Management Note (Signed)
    Page 1 of 1   05/27/2013     11:03:40 AM   CARE MANAGEMENT NOTE 05/27/2013  Patient:  Mark AnchorsGAINES,Mark C   Account Number:  [id number]  Date Initiated:  05/27/2013  Documentation initiated by:  Lorenda IshiharaPEELE,Baldomero Mirarchi  Subjective/Objective Assessment:   33 yo male admitted s/p gastric bypass. PTA lived at home with friend.     Action/Plan:   Home when stable   Anticipated DC Date:  05/28/2013   Anticipated DC Plan:  HOME/SELF CARE      DC Planning Services  CM consult      Choice offered to / List presented to:             Status of service:  Completed, signed off Medicare Important Message given?   (If response is "NO", the following Medicare IM given date fields will be blank) Date Medicare IM given:   Date Additional Medicare IM given:    Discharge Disposition:  HOME/SELF CARE  Per UR Regulation:  Reviewed for med. necessity/level of care/duration of stay  If discussed at Long Length of Stay Meetings, dates discussed:    Comments:

## 2013-05-28 LAB — GLUCOSE, CAPILLARY
Glucose-Capillary: 122 mg/dL — ABNORMAL HIGH (ref 70–99)
Glucose-Capillary: 130 mg/dL — ABNORMAL HIGH (ref 70–99)
Glucose-Capillary: 111 mg/dL — ABNORMAL HIGH (ref 70–99)

## 2013-05-28 LAB — CBC WITH DIFFERENTIAL/PLATELET
BASOS ABS: 0 10*3/uL (ref 0.0–0.1)
BASOS PCT: 0 % (ref 0–1)
EOS ABS: 0.1 10*3/uL (ref 0.0–0.7)
EOS PCT: 1 % (ref 0–5)
HCT: 43.6 % (ref 39.0–52.0)
Hemoglobin: 14 g/dL (ref 13.0–17.0)
LYMPHS PCT: 28 % (ref 12–46)
Lymphs Abs: 2.2 10*3/uL (ref 0.7–4.0)
MCH: 24.7 pg — AB (ref 26.0–34.0)
MCHC: 32.1 g/dL (ref 30.0–36.0)
MCV: 77 fL — ABNORMAL LOW (ref 78.0–100.0)
Monocytes Absolute: 0.7 10*3/uL (ref 0.1–1.0)
Monocytes Relative: 9 % (ref 3–12)
Neutro Abs: 5 10*3/uL (ref 1.7–7.7)
Neutrophils Relative %: 62 % (ref 43–77)
PLATELETS: 229 10*3/uL (ref 150–400)
RBC: 5.66 MIL/uL (ref 4.22–5.81)
RDW: 14.3 % (ref 11.5–15.5)
WBC: 8.1 10*3/uL (ref 4.0–10.5)

## 2013-05-28 MED ORDER — INSULIN GLARGINE 100 UNIT/ML ~~LOC~~ SOLN: 75.0000 [IU] | Freq: Every day | SUBCUTANEOUS | Status: DC

## 2013-05-28 MED ORDER — OXYCODONE HCL 5 MG/5ML PO SOLN: 5.0000 mg | ORAL | Status: DC | PRN

## 2013-05-28 MED ORDER — OXYCODONE-ACETAMINOPHEN 5-325 MG/5ML PO SOLN: 5.0000 mL | ORAL | Status: DC | PRN

## 2013-05-28 MED ORDER — OXYCODONE-ACETAMINOPHEN 5-325 MG/5ML PO SOLN
5.0000 mL | ORAL | Status: DC | PRN
  Administered 2013-05-28: 10 mL via ORAL
  Filled 2013-05-28: qty 10

## 2013-05-28 NOTE — Discharge Instructions (Signed)
Per bariatric nurse Be sure to check blood sugar on your reduced dose of insulin and contact Dr. Everardo All for concerns about your blood sugar or insulin dose.      GASTRIC BYPASS/SLEEVE  Home Care Instructions   These instructions are to help you care for yourself when you go home.  Call: If you have any problems.   Call [phone] and ask for the surgeon on call   If you need immediate assistance come to the ER at [facility]. Tell the ER staff you are a new post-op gastric bypass or gastric sleeve patient  Signs and symptoms to report:   Severe  vomiting or nausea o If you cannot handle clear liquids for longer than 1 day, call your surgeon   Abdominal pain which does not get better after taking your pain medication   Fever greater than 100.4  F and chills   Heart rate over 100 beats a minute   Trouble breathing   Chest pain   Redness,  swelling, drainage, or foul odor at incision (surgical) sites   If your incisions open or pull apart   Swelling or pain in calf (lower leg)   Diarrhea (Loose bowel movements that happen often), frequent watery, uncontrolled bowel movements   Constipation, (no bowel movements for 3 days) if this happens: o Take Milk of Magnesia, 2 tablespoons by mouth, 3 times a day for 2 days if needed o Stop taking Milk of Magnesia once you have had a bowel movement o Call your doctor if constipation continues Or o Take Miralax  (instead of Milk of Magnesia) following the label instructions o Stop taking Miralax once you have had a bowel movement o Call your doctor if constipation continues   Anything you think is abnormal for you   Normal side effects after surgery:   Unable to sleep at night or unable to concentrate   Irritability   Being tearful (crying) or depressed  These are common complaints, possibly related to your anesthesia, stress of surgery, and change in lifestyle, that usually go away a few weeks after surgery. If these feelings continue,  call your medical doctor.  Wound Care: You may have surgical glue, steri-strips, or staples over your incisions after surgery   Surgical glue: Looks like clear film over your incisions and will wear off a little at a time   Steri-strips: Adhesive strips of tape over your incisions. You may notice a yellowish color on skin under the steri-strips. This is used to make the steri-strips stick better. Do not pull the steri-strips off - let them fall off   Staples: Staples may be removed before you leave the hospital o If you go home with staples, call [facility] for an appointment with your surgeons nurse to have staples removed 10 days after surgery, [phone]   Showering: You may shower two (2) days after your surgery unless your surgeon tells you differently o Wash gently around incisions with warm soapy water, rinse well, and gently pat dry o If you have a drain (tube from your incision), you may need someone to hold this while you shower o No tub baths until staples are removed and incisions are healed   Medications:   Medications should be liquid or crushed if larger than the size of a dime   Extended release pills (medication that releases a little bit at a time through the  day) should not be crushed   Depending on the size and number  of medications you take, you may need to space (take a few throughout the day)/change the time you take your medications so that you do not over-fill your pouch (smaller stomach)   Make sure you follow-up with you primary care physician to make medication changes needed during rapid weight loss and life -style changes   If you have diabetes, follow up with your doctor that orders your diabetes medication(s) within one week after surgery and check your blood sugar regularly    Do not drive while taking narcotics (pain medications)    Do not take acetaminophen (Tylenol) and Roxicet or Lortab Elixir at the same time since these pain medications  contain acetaminophen   Diet:  First 2 Weeks You will see the nutritionist about two (2) weeks after your surgery. The nutritionist will increase the types of foods you can eat if you are handling liquids well:   If you have severe vomiting or nausea and cannot handle clear liquids lasting longer than 1 day call your surgeon Protein Shake   Drink at least 2 ounces of shake 5-6 times per day   Each serving of protein shakes (usually 8-12 ounces) should have a minimum of: o 15 grams of protein o And no more than 5 grams of carbohydrate   Goal for protein each day: o Men = 80 grams per day o Women = 60 grams per day      Protein powder may be added to fluids such as non-fat milk or Lactaid milk or Soy milk (limit to 35 grams added protein powder per serving)  Hydration   Slowly increase the amount of water and other clear liquids as tolerated (See Acceptable Fluids)   Slowly increase the amount of protein shake as tolerated   Sip fluids slowly and throughout the day   May use sugar substitutes in small amounts (no more than 6-8 packets per day; i.e. Splenda)  Fluid Goal   The first goal is to drink at least 8 ounces of protein shake/drink per day (or as directed by the nutritionist); some examples of protein shakes are [employer], [employer], EAS Edge HP, and Unjury. - See handout from pre-op Bariatric Education Class: o Slowly increase the amount of protein shake you drink as tolerated o You may find it easier to slowly sip shakes throughout the day o It is important to get your proteins in first   Your fluid goal is to drink 64-100 ounces of fluid daily o It may take a few weeks to build up to this    32 oz. (or more) should be clear liquids And   32 oz. (or more) should be full liquids (see below for examples)   Liquids should not contain sugar, caffeine, or carbonation  Clear Liquids:   Water of Sugar-free flavored water (i.e. Fruit HO, Propel)   Decaffeinated coffee  or tea (sugar-free)   Crystal lite, Wylers Lite, Minute Maid Lite   Sugar-free Jell-O   Bouillon or broth   Sugar-free Popsicle:    - Less than 20 calories each; Limit 1 per day  Full Liquids:                   Protein Shakes/Drinks + 2 choices per day of other full liquids   Full liquids must be: o No More Than 12 grams of Carbs per serving o No More Than 3 grams of Fat per serving   Strained low-fat cream soup   Non-Fat milk  Fat-free Lactaid Milk   Sugar-free yogurt (Dannon Lite & Fit, Greek yogurt)    Vitamins and Minerals   Start 1 day after surgery unless otherwise directed by your surgeon   2 Chewable Multivitamin / Multimineral Supplement with iron (i.e. Centrum for Adults)   Vitamin B-12, 350-500 micrograms sub-lingual (place tablet under the tongue) each day   Chewable Calcium Citrate with Vitamin D-3 (Example: 3 Chewable Calcium  Plus 600 with Vitamin D-3) o Take 500 mg three (3) times a day for a total of 1500 mg each day o Do not take all 3 doses of calcium at one time as it may cause constipation, and you can only absorb 500 mg at a time o Do not mix multivitamins containing iron with calcium supplements;  take 2 hours apart o Do not substitute Tums (calcium carbonate) for your calcium   Menstruating women and those at risk for anemia ( a blood disease that causes weakness) may need extra iron o Talk to your doctor to see if you need more iron   If you need extra iron: Total daily Iron recommendation (including Vitamins) is 50 to 100 mg Iron/day   Do not stop taking or change any vitamins or minerals until you talk to your nutritionist or surgeon   Your nutritionist and/or surgeon must approve all vitamin and mineral supplements   Activity and Exercise: It is important to continue walking at home. Limit your physical activity as instructed by your doctor. During this time, use these guidelines:   Do not lift anything greater than ten  (10) pounds for at least two (2)  weeks   Do not go back to work or drive until Designer, industrial/productyour surgeon says you can   You may have sex when you feel comfortable o It is VERY important for male patients to use a reliable birth control method; fertility often increase after surgery o Do not get pregnant for at least 18 months   Start exercising as soon as your doctor tells you that you can o Make sure your doctor approves any physical activity   Start with a simple walking program   Walk 5-15 minutes each day, 7 days per week   Slowly increase until you are walking 30-45 minutes per day   Consider joining our BELT program. [phone] or email [email or web address]   Special Instructions Things to remember:   Free counseling is available for you and your family through collaboration between [facility] and [facility]. Please call [phone] and leave a message   Use your CPAP when sleeping if this applies to you   Consider buying a medical alert bracelet that says you had lap-band surgery     You will likely have your first fill (fluid added to your band) 6 - 8 weeks after surgery   [facility] has a free Bariatric Surgery Support Group that meets monthly, the 3rd Thursday, 6pm. [facility] Classrooms. You can see classes online at [email or web address]   It is very important to keep all follow up appointments with your surgeon, nutritionist, primary care physician, and behavioral health practitioner o After the first year, please follow up with your bariatric surgeon and nutritionist at least once a year in order to maintain best weight loss results                    [facility]:  [phone]  [facility]: [phone]               Bariatric Nurse Coordinator: [phone]  Gastric Bypass/Sleeve Home Care Instructions  Rev. 06/2012                                                         Reviewed and Endorsed                                                     by [facility] Patient Education Committee, Jan, 2014

## 2013-05-28 NOTE — Progress Notes (Signed)
Patient alert and oriented, pain is controlled. Patient is tolerating fluids, plan to advance to protein shake today. Reviewed Gastric Bypass discharge instructions with patient and patient is able to articulate understanding. Provided information on BELT program, Support Group and [facility] outpatient pharmacy. All questions answered, will continue to monitor.  

## 2013-05-28 NOTE — Progress Notes (Signed)
Patient ID: Mark Oconnor, male   DOB: 04/25/1981, 33 y.o.   MRN: [id number] 2 Days Post-Op  Subjective: Feels "fantastic". Denies any abdominal pain or nausea. Tolerating water well. Has had a couple of loose bowel movements. Walking frequently.  Objective: Vital signs in last 24 hours: Temp:  [97.6 F (36.4 C)-98.9 F (37.2 C)] 98.6 F (37 C) (01/21 0630) Pulse Rate:  [71-78] 76 (01/21 0630) Resp:  [20] 20 (01/21 0630) BP: (117-189)/(73-92) 117/73 mmHg (01/21 0630) SpO2:  [98 %-100 %] 98 % (01/21 0630) Last BM Date: 05/25/13  Intake/Output from previous day: 01/20 0701 - 01/21 0700 In: 2895.8 [I.V.:2895.8] Out: 1000 [Urine:1000] Intake/Output this shift:    General appearance: alert, cooperative and no distress GI: normal findings: soft, non-tender Incision/Wound: clean and dry without erythema  Lab Results:   Recent Labs  05/27/13 0450 05/27/13 1559 05/28/13 0509  WBC 10.8*  --  8.1  HGB 14.5 15.2 14.0  HCT 45.1 45.7 43.6  PLT 275  --  229   BMET No results found for this basename: NA, K, CL, CO2, GLUCOSE, BUN, CREATININE, CALCIUM,  in the last 72 hours  CBG (last 3)   Recent Labs  05/27/13 1945 05/27/13 2336 05/28/13 0354  GLUCAP 154* 112* 122*     Studies/Results: Dg Ugi W/water Sol Cm  05/27/2013   CLINICAL DATA:  Post gastric bypass.  EXAM: WATER SOLUBLE UPPER GI SERIES  TECHNIQUE: Single-column upper GI series was performed using water soluble contrast.  CONTRAST:  50mL OMNIPAQUE IOHEXOL 300 MG/ML  SOLN  COMPARISON:  None.  FLUOROSCOPY TIME:  1 min in 23 seconds.  FINDINGS: Post gastric bypass. No holdup of contrast at the anastomotic site. No leak is identified. Minimal prominence of the proximal small bowel loops.  IMPRESSION: Post gastric bypass. No holdup of contrast at the anastomotic site. No leak is identified.  This is a call report.   Electronically Signed   By: Bridgett LarssonSteve  Olson M.D.   On: 05/27/2013 10:17    Anti-infectives: Anti-infectives    Start     Dose/Rate Route Frequency Ordered Stop   05/26/13 0527  cefOXitin (MEFOXIN) 2 g in dextrose 5 % 50 mL IVPB     2 g 100 mL/hr over 30 Minutes Intravenous On call to O.R. 05/26/13 [id number] 05/26/13 0939      Assessment/Plan: s/p Procedure(s): LAPAROSCOPIC ROUX-EN-Y GASTRIC BYPASS WITH UPPER ENDOSCOPY Doing very well without evidence of complication. Advanced protein shakes and plan discharge today if tolerated well Blood sugar under very good control. Will discharge home on one half of his Lantus dose (75 units) and he will monitor closely.   LOS: 2 days    Abhinav Mayorquin T 05/28/2013

## 2013-05-28 NOTE — Discharge Summary (Signed)
   Patient ID: Mark Oconnor [id number] 32 y.o. 02/24/1981  05/26/2013  Discharge date and time: 05/28/2013   Admitting Physician: Glenna FellowsHOXWORTH,Hy Swiatek T  Discharge Physician: Glenna FellowsHOXWORTH,Cyan Clippinger T  Admission Diagnoses: morbid obesity   Discharge Diagnoses: same  Operations: Procedure(s): LAPAROSCOPIC ROUX-EN-Y GASTRIC BYPASS WITH UPPER ENDOSCOPY  Admission Condition: good  Discharged Condition: good  Indication for Admission: patient is a 33 year old male with progressive morbid obesity unresponsive to medical management and presents with significant comorbidities insulin-dependent diabetes mellitus and hypertension and dyslipidemia.  Following extensive preoperative workup and discussion detailed elsewhere he is electively admitted for laparoscopic Roux-en-Y gastric bypass.  Hospital Course: patient on the morning of admission and underwent an uneventful laparoscopic Roux-en-Y gastric bypass. His postoperative course was uneventful. On the first postoperative day his Gastrografin swallow showed no evidence of leak or obstruction and he was started on oral fluids. He tolerated this well. He had minimal pain. On the second postoperative day his vital signs are all within normal limits. Blood sugar is running about 120 on 50 units of Lantus daily. CBC is normal. His abdomen is soft nontender and wounds healing well. He will be advanced to protein shakes and discharged if tolerated well.   Disposition: Home  Patient Instructions:    Medication List    TAKE these medications       Canagliflozin 300 MG Tabs  Take 1 tablet by mouth daily with breakfast.     insulin glargine 100 UNIT/ML injection  Commonly known as:  LANTUS  Inject 0.75 mLs (75 Units total) into the skin daily.     methocarbamol 750 MG tablet  Commonly known as:  ROBAXIN  Take 750 mg by mouth 4 (four) times daily.     montelukast 10 MG tablet  Commonly known as:  SINGULAIR  Take 10 mg by mouth at bedtime.     Olmesartan-Amlodipine-HCTZ 40-5-12.5 MG Tabs  Take 1 tablet by mouth daily with breakfast.     ONE TOUCH ULTRA TEST test strip  Generic drug:  glucose blood  USE TO TEST BLOOD SUGAR TWICE DAILY     oxyCODONE 5 MG/5ML solution  Commonly known as:  ROXICODONE  Take 5-10 mLs (5-10 mg total) by mouth every 4 (four) hours as needed.     rosuvastatin 20 MG tablet  Commonly known as:  CRESTOR  Take 20 mg by mouth at bedtime.     VICTOZA 18 MG/3ML Soln injection  Generic drug:  Liraglutide  Inject 1.8 mg into the skin daily.      ASK your doctor about these medications       albuterol 108 (90 BASE) MCG/ACT inhaler  Commonly known as:  PROVENTIL HFA;VENTOLIN HFA  Inhale 2 puffs into the lungs every 6 (six) hours as needed for wheezing or shortness of breath.        Activity: activity as tolerated Diet:bariatric protein shakes Wound Care: none needed  Follow-up:  With Dr. Johna SheriffHoxworth in 3 weeks and Dr. Everardo AllEllison for management of his diabetes.  Signed: Mariella SaaBenjamin T Caniyah Murley MD, FACS  05/28/2013, 7:52 AM

## 2013-06-04 ENCOUNTER — Encounter: Payer: Self-pay | Admitting: Endocrinology

## 2013-06-04 ENCOUNTER — Ambulatory Visit (INDEPENDENT_AMBULATORY_CARE_PROVIDER_SITE_OTHER): Payer: BC Managed Care – PPO | Admitting: Endocrinology

## 2013-06-04 VITALS — BP 112/62 | HR 90 | Temp 98.2°F | Ht 75.0 in | Wt 307.0 lb

## 2013-06-04 DIAGNOSIS — IMO0001 Reserved for inherently not codable concepts without codable children: Secondary | ICD-10-CM

## 2013-06-04 DIAGNOSIS — E1165 Type 2 diabetes mellitus with hyperglycemia: Principal | ICD-10-CM

## 2013-06-04 MED ORDER — METFORMIN HCL 500 MG PO TABS: 1000.0000 mg | ORAL_TABLET | Freq: Two times a day (BID) | ORAL | Status: DC

## 2013-06-04 MED ORDER — ATORVASTATIN CALCIUM 40 MG PO TABS: 40.0000 mg | ORAL_TABLET | Freq: Every day | ORAL | Status: DC

## 2013-06-04 NOTE — Patient Instructions (Addendum)
Please reduce the lantus to metformin. i have sent a prescription to your pharmacy.  You will probably need to split or crush this.      Please come back for a follow-up appointment in 2 weeks.   check your blood sugar twice a day.  vary the time of day when you check, between before the 3 meals, and at bedtime.  also check if you have symptoms of your blood sugar being too high or too low.  please keep a record of the readings and bring it to your next appointment here.  please call us sooner if your blood sugar goes below 70, or if you have a lot of readings over 200.   Please stop the "tribenzor."   On Friday, start "benicar," 40 mg daily.  Here are some samples. Please change crestor to generic lipitor.  i have sent a prescription to your pharmacy.

## 2013-06-04 NOTE — Progress Notes (Signed)
Subjective:    Patient ID: Mark Oconnor, male    DOB: 06/01/1980, 33 y.o.   MRN: [id number]  HPI Pt returns for f/u of insulin-requiring DM (dx'ed 2011; he has mild if any neuropathy of the lower extremities; he has no known associated complications; he was on high-dose insulin until gastric bypass surgery in January of 2014).  He has lost 18 lbs so far.  no cbg record, but states cbg's are frequently low, even with reducing the lantus to 40 units qd.  He says he is very hungry.   Past Medical History  Diagnosis Date  . Diabetes mellitus   . Hyperlipidemia   . Hypertension   . Morbid obesity   . GERD (gastroesophageal reflux disease)   . Sleep apnea 05-21-13    denies.Stop/Bang score =4 with PAT visit    Past Surgical History  Procedure Laterality Date  . Breath tek h pylori N/A 07/12/2012    Procedure: BREATH TEK H PYLORI;  Surgeon: Mariella SaaBenjamin T Hoxworth, MD;  Location: [facility];  Service: General;  Laterality: N/A;  . Gastric roux-en-y N/A 05/26/2013    Procedure: LAPAROSCOPIC ROUX-EN-Y GASTRIC BYPASS WITH UPPER ENDOSCOPY;  Surgeon: Mariella SaaBenjamin T Hoxworth, MD;  Location: [facility];  Service: General;  Laterality: N/A;    History   Social History  . Marital Status: Single    Spouse Name: N/A    Number of Children: N/A  . Years of Education: N/A   Occupational History  . Not on file.   Social History Main Topics  . Smoking status: Former Smoker -- 0.50 packs/day for 15 years    Types: Cigarettes    Quit date: 12/07/2011  . Smokeless tobacco: Never Used  . Alcohol Use: Yes     Comment: occasional to rare  . Drug Use: No  . Sexual Activity: Yes   Other Topics Concern  . Not on file   Social History Narrative  . No narrative on file    Current Outpatient Prescriptions on File Prior to Visit  Medication Sig Dispense Refill  . Canagliflozin 300 MG TABS Take 1 tablet by mouth daily with breakfast.      . glucose blood (ONE TOUCH ULTRA TEST) test strip USE TO TEST BLOOD  SUGAR TWICE DAILY      . methocarbamol (ROBAXIN) 750 MG tablet Take 750 mg by mouth 4 (four) times daily.      . montelukast (SINGULAIR) 10 MG tablet Take 10 mg by mouth at bedtime.      Marland Kitchen. albuterol (PROVENTIL HFA;VENTOLIN HFA) 108 (90 BASE) MCG/ACT inhaler Inhale 2 puffs into the lungs every 6 (six) hours as needed for wheezing or shortness of breath.       No current facility-administered medications on file prior to visit.    Allergies  Allergen Reactions  . Other Itching    Tree nuts     Family History  Problem Relation Age of Onset  . Diabetes Father   . Cancer Neg Hx   . Heart disease Neg Hx   . Hyperlipidemia Neg Hx   . Hypertension Neg Hx   . Diabetes Mother   . Diabetes Brother     one brother  diabetic    BP 112/62  Pulse 90  Temp(Src) 98.2 F (36.8 C) (Oral)  Ht 6\' 3"  (1.905 m)  Wt 307 lb (139.254 kg)  BMI 38.37 kg/m2  SpO2 97%  Review of Systems Denies LOC and n/v.      Objective:  Physical Exam VITAL SIGNS:  See vs page GENERAL: no distress     Assessment & Plan:  DM: apparently overcontrolled. Bariatric surgery status: this has greatly helped insulin resistance so far.   HTN: overcontrolled due to weight loss. Dyslipidemia: he probably needs less rx now.

## 2013-06-06 ENCOUNTER — Telehealth: Payer: Self-pay

## 2013-06-06 ENCOUNTER — Encounter (INDEPENDENT_AMBULATORY_CARE_PROVIDER_SITE_OTHER): Payer: Self-pay | Admitting: General Surgery

## 2013-06-06 ENCOUNTER — Ambulatory Visit (INDEPENDENT_AMBULATORY_CARE_PROVIDER_SITE_OTHER): Payer: BC Managed Care – PPO | Admitting: General Surgery

## 2013-06-06 VITALS — BP 122/86 | HR 88 | Temp 98.6°F | Resp 18 | Ht 76.0 in | Wt 302.4 lb

## 2013-06-06 DIAGNOSIS — Z6841 Body Mass Index (BMI) 40.0 and over, adult: Secondary | ICD-10-CM

## 2013-06-06 NOTE — Telephone Encounter (Signed)
Left pt a voice mail instructing of changes.

## 2013-06-06 NOTE — Telephone Encounter (Signed)
Pt called stating that metformin has been causing nausea and diarrhea. Wanted to know if changing the medication or dose age would help.  Please advise, Thanks!

## 2013-06-06 NOTE — Progress Notes (Signed)
Chief complaint: Followup gastric bypass  History: Patient returns for followup approaching 2 weeks status post laparoscopic Roux-en-Y gastric bypass for morbid obesity and multiple comorbidities. He is getting along quite well. He tolerated protein shakes well and is transitioning to solid proteins without difficulty. He saw Dr. Everardo AllEllison and all his insulin was stopped and he was placed on metformin only. This however appeared to cause a lot of nausea yesterday and he now recalls this happened in the past when he tried to take metformin. He will call Dr. Everardo AllEllison today to discuss alternatives. He states his blood sugars are running in the 80-90 range. No nausea or vomiting other than yesterday with the metformin. No abdominal pain or fever.  Exam: BP 122/86  Pulse 88  Temp(Src) 98.6 F (37 C) (Temporal)  Resp 18  Ht 6\' 4"  (1.93 m)  Wt 302 lb 6.4 oz (137.168 kg)  BMI 36.82 kg/m2 Weight loss 25 pounds since surgery General: Appears well Abdomen: Soft and nontender. Wounds are healing well.  Assessment and plan: Doing well following gastric bypass without complication identified. He is to contact Dr. Everardo AllEllison for medication instructions as above. I will see him back in one month.

## 2013-06-06 NOTE — Telephone Encounter (Signed)
Please reduce metformin to 1 pill, twice a day

## 2013-06-08 LAB — HM DIABETES EYE EXAM

## 2013-06-10 ENCOUNTER — Encounter: Payer: BC Managed Care – PPO | Attending: General Surgery

## 2013-06-10 VITALS — Ht 75.0 in | Wt 327.0 lb

## 2013-06-10 DIAGNOSIS — Z09 Encounter for follow-up examination after completed treatment for conditions other than malignant neoplasm: Secondary | ICD-10-CM | POA: Insufficient documentation

## 2013-06-10 DIAGNOSIS — Z9884 Bariatric surgery status: Secondary | ICD-10-CM | POA: Insufficient documentation

## 2013-06-10 DIAGNOSIS — G4733 Obstructive sleep apnea (adult) (pediatric): Secondary | ICD-10-CM

## 2013-06-10 DIAGNOSIS — Z713 Dietary counseling and surveillance: Secondary | ICD-10-CM | POA: Insufficient documentation

## 2013-06-10 NOTE — Progress Notes (Signed)
Bariatric Class:  Appt start time: 1530 end time:  1630.  2 Week Post-Operative Nutrition Class  Patient was seen on 06/10/2013 for Post-Operative Nutrition education at [facility].   Surgery date: 05/26/2013 Surgery type: RYGB Start weight at [facility]: 327 lbs on 06/27/2012 Weight today: 303.0 lbs Weight change: 24 lbs   TANITA  BODY COMP RESULTS  06/10/13   BMI (kg/m^2) 37.9   Fat Mass (lbs) 129.5   Fat Free Mass (lbs) 173.5   Total Body Water (lbs) 127.0   The following the learning objectives were met by the patient during this course:  Identifies Phase 3A (Soft, High Proteins) Dietary Goals and will begin from 2 weeks post-operatively to 2 months post-operatively  Identifies appropriate sources of fluids and proteins   States protein recommendations and appropriate sources post-operatively  Identifies the need for appropriate texture modifications, mastication, and bite sizes when consuming solids  Identifies appropriate multivitamin and calcium sources post-operatively  Describes the need for physical activity post-operatively and will follow MD recommendations  States when to call healthcare provider regarding medication questions or post-operative complications  Handouts given during class include:  Phase 3A: Soft, High Protein Diet Handout  Handouts given during class include:  [place], lot # [id number], exp 02/2014   Follow-Up Plan: Patient will follow-up at [facility] in 6 weeks for 8 week post-op nutrition visit for diet advancement per MD.

## 2013-06-11 ENCOUNTER — Telehealth: Payer: Self-pay

## 2013-06-11 NOTE — Telephone Encounter (Signed)
Ok, please d/c metformin Call if cbg's go over 200. i'll see you next time.

## 2013-06-11 NOTE — Telephone Encounter (Signed)
Pt called stating that he is still having issues with nausea and vomiting due to the metformin. Pt states that years back he was having this issue and was placed on Janumet.  Pt denies having issues with with nausea and vomiting while on this medication. Please advise, Thanks!

## 2013-06-12 NOTE — Telephone Encounter (Signed)
Pt informed coming in for appointment on 06/18/2013.

## 2013-06-13 ENCOUNTER — Other Ambulatory Visit (INDEPENDENT_AMBULATORY_CARE_PROVIDER_SITE_OTHER): Payer: BC Managed Care – PPO

## 2013-06-13 ENCOUNTER — Ambulatory Visit (INDEPENDENT_AMBULATORY_CARE_PROVIDER_SITE_OTHER): Payer: BC Managed Care – PPO | Admitting: Internal Medicine

## 2013-06-13 ENCOUNTER — Encounter: Payer: Self-pay | Admitting: Internal Medicine

## 2013-06-13 VITALS — BP 118/80 | HR 82 | Temp 97.8°F | Resp 16 | Ht 75.0 in | Wt 302.5 lb

## 2013-06-13 DIAGNOSIS — E781 Pure hyperglyceridemia: Secondary | ICD-10-CM

## 2013-06-13 DIAGNOSIS — I10 Essential (primary) hypertension: Secondary | ICD-10-CM

## 2013-06-13 DIAGNOSIS — N529 Male erectile dysfunction, unspecified: Secondary | ICD-10-CM

## 2013-06-13 DIAGNOSIS — E1165 Type 2 diabetes mellitus with hyperglycemia: Secondary | ICD-10-CM

## 2013-06-13 DIAGNOSIS — IMO0001 Reserved for inherently not codable concepts without codable children: Secondary | ICD-10-CM

## 2013-06-13 DIAGNOSIS — E785 Hyperlipidemia, unspecified: Secondary | ICD-10-CM

## 2013-06-13 LAB — HM DIABETES FOOT EXAM

## 2013-06-13 LAB — HEMOGLOBIN A1C: HEMOGLOBIN A1C: 11.2 % — AB (ref 4.6–6.5)

## 2013-06-13 MED ORDER — AVANAFIL 100 MG PO TABS: 1.0000 | ORAL_TABLET | Freq: Every day | ORAL | Status: DC

## 2013-06-13 NOTE — Assessment & Plan Note (Signed)
This has improved significantly since his surgery  He is due for an A1C He sees ENDO next week as well

## 2013-06-13 NOTE — Assessment & Plan Note (Signed)
I will recheck his lipids today Since he is post-gastric surgery this should be improved

## 2013-06-13 NOTE — Progress Notes (Signed)
Subjective:    Patient ID: Mark Oconnor, male    DOB: 09/25/1980, 33 y.o.   MRN: [id number]  Diabetes He presents for his follow-up diabetic visit. He has type 2 diabetes mellitus. His disease course has been improving. There are no hypoglycemic associated symptoms. Pertinent negatives for hypoglycemia include no dizziness. Pertinent negatives for diabetes include no blurred vision, no chest pain, no fatigue, no foot paresthesias, no foot ulcerations, no polydipsia, no polyphagia, no polyuria, no visual change, no weakness and no weight loss. Diabetic complications include impotence. There are no known risk factors for coronary artery disease. Current diabetic treatment includes diet. He is compliant with treatment most of the time. His weight is decreasing steadily. He is following a generally healthy diet. Meal planning includes avoidance of concentrated sweets. He has not had a previous visit with a dietician. He participates in exercise intermittently. His home blood glucose trend is decreasing rapidly. (He tells me that all of his CBG's are less than 200) An ACE inhibitor/angiotensin II receptor blocker is being taken. He does not see a podiatrist.Eye exam is not current.      Review of Systems  Constitutional: Negative.  Negative for chills, weight loss, diaphoresis, appetite change and fatigue.  HENT: Negative.   Eyes: Negative.  Negative for blurred vision.  Respiratory: Negative.  Negative for cough, choking, chest tightness, shortness of breath and stridor.   Cardiovascular: Negative.  Negative for chest pain, palpitations and leg swelling.  Gastrointestinal: Negative.  Negative for nausea, vomiting, abdominal pain, diarrhea, constipation and blood in stool.  Endocrine: Negative.  Negative for polydipsia, polyphagia and polyuria.  Genitourinary: Positive for impotence.  Musculoskeletal: Negative.   Skin: Negative.   Allergic/Immunologic: Negative.   Neurological: Negative.   Negative for dizziness and weakness.  Hematological: Negative.  Negative for adenopathy. Does not bruise/bleed easily.  Psychiatric/Behavioral: Negative.        Objective:   Physical Exam  Vitals reviewed. Constitutional: He is oriented to person, place, and time. He appears well-developed and well-nourished. No distress.  HENT:  Head: Normocephalic and atraumatic.  Mouth/Throat: Oropharynx is clear and moist. No oropharyngeal exudate.  Eyes: Conjunctivae are normal. Right eye exhibits no discharge. Left eye exhibits no discharge. No scleral icterus.  Neck: Normal range of motion. Neck supple. No JVD present. No thyromegaly present.  Cardiovascular: Normal rate, regular rhythm, normal heart sounds and intact distal pulses.  Exam reveals no gallop and no friction rub.   No murmur heard. Pulmonary/Chest: Effort normal and breath sounds normal. No stridor. No respiratory distress. He has no wheezes. He has no rales. He exhibits no tenderness.  Abdominal: Soft. Bowel sounds are normal. He exhibits no distension and no mass. There is no tenderness. There is no rebound and no guarding.  Musculoskeletal: Normal range of motion. He exhibits no edema and no tenderness.  Lymphadenopathy:    He has no cervical adenopathy.  Neurological: He is oriented to person, place, and time.  Skin: Skin is warm and dry. No rash noted. He is not diaphoretic. No erythema. No pallor.     Lab Results  Component Value Date   WBC 8.1 05/28/2013   HGB 14.0 05/28/2013   HCT 43.6 05/28/2013   PLT 229 05/28/2013   GLUCOSE 194* 05/21/2013   CHOL 170 12/10/2012   TRIG 277.0* 12/10/2012   HDL 34.00* 12/10/2012   LDLDIRECT 91.2 12/10/2012   LDLCALC 202* 06/07/2012   ALT 18 08/28/2012   AST 13 08/28/2012   NA  135* 05/21/2013   K 4.8 05/21/2013   CL 97 05/21/2013   CREATININE 1.17 05/21/2013   BUN 20 05/21/2013   CO2 25 05/21/2013   TSH 0.72 08/28/2012   PSA 0.53 04/26/2011   HGBA1C 12.5* 12/10/2012       Assessment & Plan:

## 2013-06-13 NOTE — Patient Instructions (Signed)
Type 2 Diabetes Mellitus, Adult Type 2 diabetes mellitus, often simply referred to as type 2 diabetes, is a long-lasting (chronic) disease. In type 2 diabetes, the pancreas does not make enough insulin (a hormone), the cells are less responsive to the insulin that is made (insulin resistance), or both. Normally, insulin moves sugars from food into the tissue cells. The tissue cells use the sugars for energy. The lack of insulin or the lack of normal response to insulin causes excess sugars to build up in the blood instead of going into the tissue cells. As a result, high blood sugar (hyperglycemia) develops. The effect of high sugar (glucose) levels can cause many complications. Type 2 diabetes was also previously called adult-onset diabetes but it can occur at any age.  RISK FACTORS  A person is predisposed to developing type 2 diabetes if someone in the family has the disease and also has one or more of the following primary risk factors:  Overweight.  An inactive lifestyle.  A history of consistently eating high-calorie foods. Maintaining a normal weight and regular physical activity can reduce the chance of developing type 2 diabetes. SYMPTOMS  A person with type 2 diabetes may not show symptoms initially. The symptoms of type 2 diabetes appear slowly. The symptoms include:  Increased thirst (polydipsia).  Increased urination (polyuria).  Increased urination during the night (nocturia).  Weight loss. This weight loss may be rapid.  Frequent, recurring infections.  Tiredness (fatigue).  Weakness.  Vision changes, such as blurred vision.  Fruity smell to your breath.  Abdominal pain.  Nausea or vomiting.  Cuts or bruises which are slow to heal.  Tingling or numbness in the hands or feet. DIAGNOSIS Type 2 diabetes is frequently not diagnosed until complications of diabetes are present. Type 2 diabetes is diagnosed when symptoms or complications are present and when blood  glucose levels are increased. Your blood glucose level may be checked by one or more of the following blood tests:  A fasting blood glucose test. You will not be allowed to eat for at least 8 hours before a blood sample is taken.  A random blood glucose test. Your blood glucose is checked at any time of the day regardless of when you ate.  A hemoglobin A1c blood glucose test. A hemoglobin A1c test provides information about blood glucose control over the previous 3 months.  An oral glucose tolerance test (OGTT). Your blood glucose is measured after you have not eaten (fasted) for 2 hours and then after you drink a glucose-containing beverage. TREATMENT   You may need to take insulin or diabetes medicine daily to keep blood glucose levels in the desired range.  You will need to match insulin dosing with exercise and healthy food choices. The treatment goal is to maintain the before meal blood sugar (preprandial glucose) level at 70 130 mg/dL. HOME CARE INSTRUCTIONS   Have your hemoglobin A1c level checked twice a year.  Perform daily blood glucose monitoring as directed by your caregiver.  Monitor urine ketones when you are ill and as directed by your caregiver.  Take your diabetes medicine or insulin as directed by your caregiver to maintain your blood glucose levels in the desired range.  Never run out of diabetes medicine or insulin. It is needed every day.  Adjust insulin based on your intake of carbohydrates. Carbohydrates can raise blood glucose levels but need to be included in your diet. Carbohydrates provide vitamins, minerals, and fiber which are an essential part of   a healthy diet. Carbohydrates are found in fruits, vegetables, whole grains, dairy products, legumes, and foods containing added sugars.    Eat healthy foods. Alternate 3 meals with 3 snacks.  Lose weight if overweight.  Carry a medical alert card or wear your medical alert jewelry.  Carry a 15 gram  carbohydrate snack with you at all times to treat low blood glucose (hypoglycemia). Some examples of 15 gram carbohydrate snacks include:  Glucose tablets, 3 or 4   Glucose gel, 15 gram tube  Raisins, 2 tablespoons (24 grams)  Jelly beans, 6  Animal crackers, 8  Regular pop, 4 ounces (120 mL)  Gummy treats, 9  Recognize hypoglycemia. Hypoglycemia occurs with blood glucose levels of 70 mg/dL and below. The risk for hypoglycemia increases when fasting or skipping meals, during or after intense exercise, and during sleep. Hypoglycemia symptoms can include:  Tremors or shakes.  Decreased ability to concentrate.  Sweating.  Increased heart rate.  Headache.  Dry mouth.  Hunger.  Irritability.  Anxiety.  Restless sleep.  Altered speech or coordination.  Confusion.  Treat hypoglycemia promptly. If you are alert and able to safely swallow, follow the 15:15 rule:  Take 15 20 grams of rapid-acting glucose or carbohydrate. Rapid-acting options include glucose gel, glucose tablets, or 4 ounces (120 mL) of fruit juice, regular soda, or low fat milk.  Check your blood glucose level 15 minutes after taking the glucose.  Take 15 20 grams more of glucose if the repeat blood glucose level is still 70 mg/dL or below.  Eat a meal or snack within 1 hour once blood glucose levels return to normal.    Be alert to polyuria and polydipsia which are early signs of hyperglycemia. An early awareness of hyperglycemia allows for prompt treatment. Treat hyperglycemia as directed by your caregiver.  Engage in at least 150 minutes of moderate-intensity physical activity a week, spread over at least 3 days of the week or as directed by your caregiver. In addition, you should engage in resistance exercise at least 2 times a week or as directed by your caregiver.  Adjust your medicine and food intake as needed if you start a new exercise or sport.  Follow your sick day plan at any time you  are unable to eat or drink as usual.  Avoid tobacco use.  Limit alcohol intake to no more than 1 drink per day for nonpregnant women and 2 drinks per day for men. You should drink alcohol only when you are also eating food. Talk with your caregiver whether alcohol is safe for you. Tell your caregiver if you drink alcohol several times a week.  Follow up with your caregiver regularly.  Schedule an eye exam soon after the diagnosis of type 2 diabetes and then annually.  Perform daily skin and foot care. Examine your skin and feet daily for cuts, bruises, redness, nail problems, bleeding, blisters, or sores. A foot exam by a caregiver should be done annually.  Brush your teeth and gums at least twice a day and floss at least once a day. Follow up with your dentist regularly.  Share your diabetes management plan with your workplace or school.  Stay up-to-date with immunizations.  Learn to manage stress.  Obtain ongoing diabetes education and support as needed.  Participate in, or seek rehabilitation as needed to maintain or improve independence and quality of life. Request a physical or occupational therapy referral if you are having foot or hand numbness or difficulties with grooming,   dressing, eating, or physical activity. SEEK MEDICAL CARE IF:   You are unable to eat food or drink fluids for more than 6 hours.  You have nausea and vomiting for more than 6 hours.  Your blood glucose level is over 240 mg/dL.  There is a change in mental status.  You develop an additional serious illness.  You have diarrhea for more than 6 hours.  You have been sick or have had a fever for a couple of days and are not getting better.  You have pain during any physical activity.  SEEK IMMEDIATE MEDICAL CARE IF:  You have difficulty breathing.  You have moderate to large ketone levels. MAKE SURE YOU:  Understand these instructions.  Will watch your condition.  Will get help right away if  you are not doing well or get worse. Document Released: 04/24/2005 Document Revised: 01/17/2012 Document Reviewed: 11/21/2011 Exit[employer] Patient Information 2014 [employer].  

## 2013-06-13 NOTE — Assessment & Plan Note (Signed)
His BP is well controlled Today I will check his lytes and renal function 

## 2013-06-13 NOTE — Assessment & Plan Note (Signed)
FLP today 

## 2013-06-13 NOTE — Progress Notes (Signed)
Pre visit review using our clinic review tool, if applicable. No additional management support is needed unless otherwise documented below in the visit note. 

## 2013-06-13 NOTE — Assessment & Plan Note (Signed)
He will try stendra 

## 2013-06-16 LAB — BASIC METABOLIC PANEL
BUN: 10 mg/dL (ref 6–23)
CALCIUM: 8.9 mg/dL (ref 8.4–10.5)
CO2: 22 mEq/L (ref 19–32)
CREATININE: 1.1 mg/dL (ref 0.4–1.5)
Chloride: 103 mEq/L (ref 96–112)
GFR: 101.3 mL/min (ref >60.00)
Glucose, Bld: 70 mg/dL (ref 70–99)
Potassium: 4.1 mEq/L (ref 3.5–5.1)
Sodium: 138 mEq/L (ref 135–145)

## 2013-06-16 LAB — LIPID PANEL
Cholesterol: 127 mg/dL (ref 0–200)
HDL: 29.9 mg/dL — AB (ref >39.00)
LDL CALC: 61 mg/dL (ref 0–99)
TRIGLYCERIDES: 181 mg/dL — AB (ref 0.0–149.0)
Total CHOL/HDL Ratio: 4
VLDL: 36.2 mg/dL (ref 0.0–40.0)

## 2013-06-18 ENCOUNTER — Encounter: Payer: Self-pay | Admitting: Endocrinology

## 2013-06-18 ENCOUNTER — Ambulatory Visit (INDEPENDENT_AMBULATORY_CARE_PROVIDER_SITE_OTHER): Payer: BC Managed Care – PPO | Admitting: Endocrinology

## 2013-06-18 VITALS — BP 112/60 | HR 73 | Temp 98.4°F | Ht 74.5 in | Wt 298.0 lb

## 2013-06-18 DIAGNOSIS — IMO0001 Reserved for inherently not codable concepts without codable children: Secondary | ICD-10-CM

## 2013-06-18 DIAGNOSIS — E1165 Type 2 diabetes mellitus with hyperglycemia: Principal | ICD-10-CM

## 2013-06-18 NOTE — Patient Instructions (Addendum)
Please stay-off diabetes medication for now.      Please come back for a follow-up appointment in 3 months.   check your blood sugar once a day.  vary the time of day when you check, between before the 3 meals, and at bedtime.  also check if you have symptoms of your blood sugar being too high or too low.  please keep a record of the readings and bring it to your next appointment here.  please call us sooner if your blood sugar goes below 70, or if you have a lot of readings over 200.

## 2013-06-18 NOTE — Progress Notes (Signed)
Subjective:    Patient ID: Mark Oconnor, male    DOB: Sep 21, 1980, 33 y.o.   MRN: [id number]  HPI Pt returns for f/u of insulin-requiring DM (dx'ed 2011; he has mild if any neuropathy of the lower extremities; he has no known associated complications; he was on high-dose insulin until gastric bypass surgery in January of 2015).  After surgery, he was changed from lantus to metformin, but he did not tolerate this.  He is off all DM medication now.   no cbg record, but states cbg's are all approx 100.   Past Medical History  Diagnosis Date  . Diabetes mellitus   . Hyperlipidemia   . Hypertension   . Morbid obesity   . GERD (gastroesophageal reflux disease)   . Sleep apnea 05-21-13    denies.Stop/Bang score =4 with PAT visit    Past Surgical History  Procedure Laterality Date  . Breath tek h pylori N/A 07/12/2012    Procedure: BREATH TEK H PYLORI;  Surgeon: Mariella Saa, MD;  Location: [facility];  Service: General;  Laterality: N/A;  . Gastric roux-en-y N/A 05/26/2013    Procedure: LAPAROSCOPIC ROUX-EN-Y GASTRIC BYPASS WITH UPPER ENDOSCOPY;  Surgeon: Mariella Saa, MD;  Location: [facility];  Service: General;  Laterality: N/A;    History   Social History  . Marital Status: Single    Spouse Name: N/A    Number of Children: N/A  . Years of Education: N/A   Occupational History  . Not on file.   Social History Main Topics  . Smoking status: Former Smoker -- 0.50 packs/day for 15 years    Types: Cigarettes    Quit date: 12/07/2011  . Smokeless tobacco: Never Used  . Alcohol Use: Yes     Comment: occasional to rare  . Drug Use: No  . Sexual Activity: Yes   Other Topics Concern  . Not on file   Social History Narrative  . No narrative on file    Current Outpatient Prescriptions on File Prior to Visit  Medication Sig Dispense Refill  . atorvastatin (LIPITOR) 40 MG tablet Take 1 tablet (40 mg total) by mouth daily.  30 tablet  3  . Avanafil (STENDRA) 100 MG  TABS Take 1 tablet by mouth daily.  12 tablet  11  . glucose blood (ONE TOUCH ULTRA TEST) test strip USE TO TEST BLOOD SUGAR TWICE DAILY      . TRIBENZOR 40-5-12.5 MG TABS daily.      Marland Kitchen albuterol (PROVENTIL HFA;VENTOLIN HFA) 108 (90 BASE) MCG/ACT inhaler Inhale 2 puffs into the lungs every 6 (six) hours as needed for wheezing or shortness of breath.       No current facility-administered medications on file prior to visit.    Allergies  Allergen Reactions  . Other Itching    Tree nuts     Family History  Problem Relation Age of Onset  . Diabetes Father   . Cancer Neg Hx   . Heart disease Neg Hx   . Hyperlipidemia Neg Hx   . Hypertension Neg Hx   . Diabetes Mother   . Diabetes Brother     one brother  diabetic    BP 112/60  Pulse 73  Temp(Src) 98.4 F (36.9 C) (Oral)  Ht 6' 2.5" (1.892 m)  Wt 298 lb (135.172 kg)  BMI 37.76 kg/m2  SpO2 98%  Review of Systems He has only intermittent n/v.  He has lost 27 lbs since surgery.  Objective:   Physical Exam VITAL SIGNS:  See vs page GENERAL: no distress PSYCH: Alert and well-oriented.  Does not appear anxious nor depressed.      Assessment & Plan:  DM: apparently overcontrolled. Bariatric surgery status: good result so far.  Vomiting, due to metformin.  Much better off it.

## 2013-06-20 ENCOUNTER — Institutional Professional Consult (permissible substitution): Payer: BC Managed Care – PPO | Admitting: Internal Medicine

## 2013-06-20 ENCOUNTER — Encounter (INDEPENDENT_AMBULATORY_CARE_PROVIDER_SITE_OTHER): Payer: Self-pay

## 2013-06-22 ENCOUNTER — Other Ambulatory Visit: Payer: Self-pay | Admitting: Internal Medicine

## 2013-06-24 ENCOUNTER — Other Ambulatory Visit: Payer: Self-pay | Admitting: Internal Medicine

## 2013-06-27 ENCOUNTER — Telehealth: Payer: Self-pay | Admitting: Dietician

## 2013-06-27 NOTE — Telephone Encounter (Signed)
Email on 06/26/2013: Hello Miss Mark Oconnor, this is Mark Oconnor. I had an appointment with you last month. I have a quick question.  I accidentally  bought the calcium  carbonate and used some so I can't return them. Will the world end if I use them up or should I go and buy the calcium citrate?   Reply on 2/20: Hi Mark Oconnor, Your body cannot absorb Calcium carbonate as well as the Calcium citrate. However, I understand that these supplements are expensive and you don't want to waste them. If you choose to continue with the Calcium carbonate, make sure you do not miss a dose and be sure to get the 1500 mg a day. Thanks! Verlon AuLeslie

## 2013-06-30 ENCOUNTER — Telehealth: Payer: Self-pay | Admitting: *Deleted

## 2013-06-30 DIAGNOSIS — N529 Male erectile dysfunction, unspecified: Secondary | ICD-10-CM

## 2013-06-30 MED ORDER — TADALAFIL 20 MG PO TABS: 20.0000 mg | ORAL_TABLET | Freq: Every day | ORAL | Status: DC | PRN

## 2013-06-30 NOTE — Telephone Encounter (Signed)
Patient phoned and stated that the previously provided stendra samples were ineffective and is requesting alternate-viagara or cialis.  Please advise.  CB# [phone]

## 2013-06-30 NOTE — Telephone Encounter (Signed)
Changed to cialis

## 2013-06-30 NOTE — Telephone Encounter (Signed)
Phoned & notified patient of MD's order which patient had already p/u.

## 2013-07-07 ENCOUNTER — Encounter (INDEPENDENT_AMBULATORY_CARE_PROVIDER_SITE_OTHER): Payer: Self-pay

## 2013-07-18 ENCOUNTER — Encounter (INDEPENDENT_AMBULATORY_CARE_PROVIDER_SITE_OTHER): Payer: Self-pay | Admitting: General Surgery

## 2013-07-18 ENCOUNTER — Ambulatory Visit (INDEPENDENT_AMBULATORY_CARE_PROVIDER_SITE_OTHER): Payer: BC Managed Care – PPO | Admitting: General Surgery

## 2013-07-18 VITALS — BP 128/80 | HR 76 | Temp 98.0°F | Resp 14 | Ht 75.0 in | Wt 281.0 lb

## 2013-07-18 DIAGNOSIS — IMO0001 Reserved for inherently not codable concepts without codable children: Secondary | ICD-10-CM

## 2013-07-18 DIAGNOSIS — Z6841 Body Mass Index (BMI) 40.0 and over, adult: Secondary | ICD-10-CM

## 2013-07-18 DIAGNOSIS — E1165 Type 2 diabetes mellitus with hyperglycemia: Secondary | ICD-10-CM

## 2013-07-18 NOTE — Progress Notes (Signed)
Chief complaint: Followup gastric bypass  History: Patient returns for followup of laparoscopic Roux-en-Y gastric bypass, now 2 months postoperatively, with preoperative comorbidities of insulin-dependent diabetes mellitus,hypertension, and GERD. He reports he is doing very well. His energy is much improved from his last visit. There are certain foods that he is not able to tolerate but for the most part he is tolerating a high protein diet without difficulty. No significant abdominal pain. No reflux. He is off of all his diabetic medications and blood sugars run generally around 100. He is off his antihypertensives as he was feeling lightheaded and dizzy standing up. He is exercising regularly running about 2 miles a day on a treadmill.  Exam: BP 128/80  Pulse 76  Temp(Src) 98 F (36.7 C) (Oral)  Resp 14  Ht 6\' 3"  (1.905 m)  Wt 281 lb (127.461 kg)  BMI 35.12 kg/m2 Total weight loss 46 pounds, 23 pounds from last visit General: Appears well Abdomen: Soft and nontender. Wounds well healed.  Assessment and plan: Doing very well following gastric bypass with excellent resolution of comorbidities of diabetes and hypertension and reflux. No concerns today. We discussed diet exercise strategies. I will see him back in 4 months and we can recheck lab at that time.

## 2013-07-22 ENCOUNTER — Encounter: Payer: BC Managed Care – PPO | Attending: General Surgery | Admitting: Dietician

## 2013-07-22 VITALS — Ht 76.0 in | Wt 280.5 lb

## 2013-07-22 DIAGNOSIS — Z6841 Body Mass Index (BMI) 40.0 and over, adult: Secondary | ICD-10-CM

## 2013-07-22 DIAGNOSIS — Z713 Dietary counseling and surveillance: Secondary | ICD-10-CM | POA: Insufficient documentation

## 2013-07-22 DIAGNOSIS — Z9884 Bariatric surgery status: Secondary | ICD-10-CM | POA: Insufficient documentation

## 2013-07-22 DIAGNOSIS — Z09 Encounter for follow-up examination after completed treatment for conditions other than malignant neoplasm: Secondary | ICD-10-CM | POA: Insufficient documentation

## 2013-07-22 NOTE — Patient Instructions (Addendum)
-  Avoid snacking on carbs, especially before meals -Increase protein intake to 80 g per day (1 oz of meat has 7 grams of protein) -High protein snacks: beef jerky, eggs with hot sauce, tuna salad with low fat mayonnaise, PB2 -Try to stop drinking while eating -Remember to eat slowly and chew thoroughly -Keep exercising!   06/10/13  07/22/2013  BMI (kg/m^2)  37.9  34.1  Fat Mass (lbs)  129.5  74.5  Fat Free Mass (lbs)  173.5  206  Total Body Water (lbs)  127.0  151

## 2013-07-22 NOTE — Progress Notes (Signed)
  Follow-up visit:  8 Weeks Post-Operative RYGB Surgery  Medical Nutrition Therapy:  Appt start time: 1615 end time:  1645.  Primary concerns today: Post-operative Bariatric Surgery Nutrition Management.  Mark Oconnor returns today having lost 55 pounds of fat since his last visit. He reports that eating is still a matter of "trial and error." He is eating a lot of oven-baked chicken wings from [employer] or [employer]. He tried pizza once and reports it got stuck. Doing ok with salads. Having trouble not drinking while eating. Mark Oconnor reports that his appetite has increased. Went back to work today and reports that he won't be snacking as much at work. He is having some popcorn and goldfish.  Surgery date: 05/26/2013  Surgery type: RYGB  [facility] lbs on 06/27/2012  Weight today: 280.5 lbs Weight change: 22.5 lbs (55 lb fat loss)  TANITA BODY COMP RESULTS   06/10/13  07/22/2013  BMI (kg/m^2)  37.9  34.1  Fat Mass (lbs)  129.5  74.5  Fat Free Mass (lbs)  173.5  206  Total Body Water (lbs)  127.0  151                     Preferred Learning Style:  No preference indicated   Learning Readiness:   Ready   24-hr recall: B (AM): Quest protein bar (20g) Snk (AM): none L (PM): lean cuisine (14-20g) Snk (PM): none  D (PM): 2 oz meat: chicken breast, steak, pork chop, or Bratwurst with veggies (14 g)  Snk (PM): none usually, sometimes popcorn  Fluid intake: diet green tea, water, Powerade Zero, Gatorade G2 (80 oz) Estimated total protein intake: 48-54 g  Medications: see list Supplementation: taking  CBG monitoring: 1x a day Average CBG per patient: 120 immediately postprandial Last patient reported A1c: 11.2%  Using straws: yes - no gas pain Drinking while eating: sometimes before and then 10 minutes after Hair loss: unknown Carbonated beverages: has had a few beers, no sodas N/V/D/C: vomiting with eggs Dumping syndrome: twice    Recent physical activity:   Runs 2 miles a day  Progress Towards Goal(s):  In progress.  Handouts given during visit include:  Phase 3B lean protein + non-starchy vegetables   Nutritional Diagnosis:  [place]-3.3 Overweight/obesity related to past poor dietary habits and physical inactivity as evidenced by patient w/ recent RYGB surgery following dietary guidelines for continued weight loss.    Intervention:  Nutrition counseling provided.  Teaching Method Utilized: Visual Auditory Hands on  Barriers to learning/adherence to lifestyle change: food preferences  Demonstrated degree of understanding via:  Teach Back   Monitoring/Evaluation:  Dietary intake, exercise, and body weight. Follow up in 1.5 months for 3.5 month post-op visit.

## 2013-08-27 ENCOUNTER — Telehealth: Payer: Self-pay | Admitting: *Deleted

## 2013-08-27 ENCOUNTER — Telehealth: Payer: Self-pay

## 2013-08-27 NOTE — Telephone Encounter (Signed)
Stop it and then come in for a BP check

## 2013-08-27 NOTE — Telephone Encounter (Signed)
Pt called stats the Tribenzor is causing him to black out.  Please advise

## 2013-08-27 NOTE — Telephone Encounter (Signed)
Spoke with pt advised of MDs message 

## 2013-08-27 NOTE — Telephone Encounter (Signed)
The patient called and wanted to speak with the CMA regarding an issued.  I asked him twice what I could type up as his question, but he refused, asking for the CMA to call back as soon as possible.

## 2013-08-28 ENCOUNTER — Ambulatory Visit (INDEPENDENT_AMBULATORY_CARE_PROVIDER_SITE_OTHER): Payer: BC Managed Care – PPO

## 2013-08-28 VITALS — BP 120/80

## 2013-08-28 DIAGNOSIS — I10 Essential (primary) hypertension: Secondary | ICD-10-CM

## 2013-08-28 NOTE — Telephone Encounter (Signed)
Per pt, already spoke with someone and will come in for nurse visit

## 2013-09-09 ENCOUNTER — Ambulatory Visit: Payer: BC Managed Care – PPO | Admitting: Dietician

## 2013-09-15 ENCOUNTER — Encounter: Payer: Self-pay | Admitting: Endocrinology

## 2013-09-15 ENCOUNTER — Ambulatory Visit: Payer: BC Managed Care – PPO | Admitting: Endocrinology

## 2013-09-15 ENCOUNTER — Ambulatory Visit (INDEPENDENT_AMBULATORY_CARE_PROVIDER_SITE_OTHER): Payer: BC Managed Care – PPO | Admitting: Endocrinology

## 2013-09-15 VITALS — BP 118/60 | HR 72 | Temp 99.1°F | Ht 76.0 in | Wt 258.0 lb

## 2013-09-15 DIAGNOSIS — N529 Male erectile dysfunction, unspecified: Secondary | ICD-10-CM

## 2013-09-15 DIAGNOSIS — IMO0001 Reserved for inherently not codable concepts without codable children: Secondary | ICD-10-CM

## 2013-09-15 DIAGNOSIS — E1165 Type 2 diabetes mellitus with hyperglycemia: Secondary | ICD-10-CM

## 2013-09-15 NOTE — Progress Notes (Signed)
Subjective:    Patient ID: Mark Oconnor, male    DOB: 10/04/1980, 33 y.o.   MRN: [id number]  HPI Pt returns for f/u of insulin-requiring DM (dx'ed 2011, when he presented with severe hyperglycemia; he has mild if any neuropathy of the lower extremities; he has no known associated complications; he has never had pancreatitis, severe hypoglycemia, or DKA; he was on high-dose insulin until gastric bypass surgery in January of 2015; since then, he takes invokana only; after surgery, he was changed from lantus to metformin, but he did not tolerate this).  pt states he feels well in general, except for fatigue.  He has lost 80 lbs since surgery.  no cbg record, but states cbg's are usually in the high-100's.   Past Medical History  Diagnosis Date  . Diabetes mellitus   . Hyperlipidemia   . Hypertension   . Morbid obesity   . GERD (gastroesophageal reflux disease)   . Sleep apnea 05-21-13    denies.Stop/Bang score =4 with PAT visit    Past Surgical History  Procedure Laterality Date  . Breath tek h pylori N/A 07/12/2012    Procedure: BREATH TEK H PYLORI;  Surgeon: Mariella SaaBenjamin T Hoxworth, MD;  [facility];  Service: General;  Laterality: N/A;  . Gastric roux-en-y N/A 05/26/2013    Procedure: LAPAROSCOPIC ROUX-EN-Y GASTRIC BYPASS WITH UPPER ENDOSCOPY;  Surgeon: Mariella SaaBenjamin T Hoxworth, MD;  Location: [facility];  Service: General;  Laterality: N/A;    History   Social History  . Marital Status: Single    Spouse Name: N/A    Number of Children: N/A  . Years of Education: N/A   Occupational History  . Not on file.   Social History Main Topics  . Smoking status: Former Smoker -- 0.50 packs/day for 15 years    Types: Cigarettes    Quit date: 12/07/2011  . Smokeless tobacco: Never Used  . Alcohol Use: Yes     Comment: occasional to rare  . Drug Use: No  . Sexual Activity: Yes   Other Topics Concern  . Not on file   Social History Narrative  . No narrative on file    Current  Outpatient Prescriptions on File Prior to Visit  Medication Sig Dispense Refill  . atorvastatin (LIPITOR) 40 MG tablet Take 1 tablet (40 mg total) by mouth daily.  30 tablet  3  . glucose blood (ONE TOUCH ULTRA TEST) test strip USE TO TEST BLOOD SUGAR TWICE DAILY      . INVOKANA 300 MG TABS TAKE 1 TABLET BY MOUTH DAILY  90 tablet  2  . ONE TOUCH ULTRA TEST test strip USE TO TEST BLOOD SUGAR TWICE DAILY  100 each  11  . tadalafil (CIALIS) 20 MG tablet Take 1 tablet (20 mg total) by mouth daily as needed for erectile dysfunction.  10 tablet  11  . TRIBENZOR 40-5-12.5 MG TABS daily.      Marland Kitchen. albuterol (PROVENTIL HFA;VENTOLIN HFA) 108 (90 BASE) MCG/ACT inhaler Inhale 2 puffs into the lungs every 6 (six) hours as needed for wheezing or shortness of breath.       No current facility-administered medications on file prior to visit.    Allergies  Allergen Reactions  . Other Itching    Tree nuts     Family History  Problem Relation Age of Onset  . Diabetes Father   . Cancer Neg Hx   . Heart disease Neg Hx   . Hyperlipidemia Neg Hx   .  Hypertension Neg Hx   . Diabetes Mother   . Diabetes Brother     one brother  diabetic    BP 118/60  Pulse 72  Temp(Src) 99.1 F (37.3 C) (Oral)  Ht 6\' 4"  (1.93 m)  Wt 258 lb (117.028 kg)  BMI 31.42 kg/m2  SpO2 97%  Review of Systems He denies hypoglycemia and depression.  He seldom has n/v.      Objective:   Physical Exam VITAL SIGNS:  See vs page GENERAL: no distress      Assessment & Plan:  DM: apparently well-controlled. Bariatric surgery status: very good result so far.

## 2013-09-15 NOTE — Patient Instructions (Addendum)
Please continue the same medication for your diabetes.  A diabetes blood test is being requested for you today.  We'll contact you with results.  check your blood sugar once a day.  vary the time of day when you check, between before the 3 meals, and at bedtime.  also check if you have symptoms of your blood sugar being too high or too low.  please keep a record of the readings and bring it to your next appointment here.  You can write it on any piece of paper.  please call us sooner if your blood sugar goes below 70, or if you have a lot of readings over 200.   Please come back for a follow-up appointment in 3 months

## 2013-09-16 LAB — MICROALBUMIN / CREATININE URINE RATIO
Creatinine,U: 68.9 mg/dL
Microalb Creat Ratio: 0.4 mg/g (ref 0.0–30.0)
Microalb, Ur: 0.3 mg/dL (ref 0.0–1.9)

## 2013-09-16 LAB — HEMOGLOBIN A1C: Hgb A1c MFr Bld: 9.9 % — ABNORMAL HIGH (ref 4.6–6.5)

## 2013-09-19 ENCOUNTER — Ambulatory Visit: Payer: BC Managed Care – PPO | Admitting: Endocrinology

## 2013-10-16 ENCOUNTER — Encounter: Payer: BC Managed Care – PPO | Attending: General Surgery | Admitting: Dietician

## 2013-10-16 VITALS — Ht 76.0 in | Wt 246.5 lb

## 2013-10-16 DIAGNOSIS — Z713 Dietary counseling and surveillance: Secondary | ICD-10-CM | POA: Insufficient documentation

## 2013-10-16 DIAGNOSIS — Z6841 Body Mass Index (BMI) 40.0 and over, adult: Secondary | ICD-10-CM

## 2013-10-16 DIAGNOSIS — Z9884 Bariatric surgery status: Secondary | ICD-10-CM | POA: Insufficient documentation

## 2013-10-16 DIAGNOSIS — Z09 Encounter for follow-up examination after completed treatment for conditions other than malignant neoplasm: Secondary | ICD-10-CM | POA: Insufficient documentation

## 2013-10-16 NOTE — Progress Notes (Signed)
  Follow-up visit:  5.5 months Post-Operative RYGB Surgery  Medical Nutrition Therapy:  Appt start time: 345 end time:  415.  Primary concerns today: Post-operative Bariatric Surgery Nutrition Management.   Mark Oconnor is here today having lost some weight, 4 pound being lean mass. His HgbA1c in May was 10% and his physician is considering adding another pill for diabetes. He reports leg soreness in both legs. He states that he is not getting enough protein. 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 Having a lot of diarrhea and some vomiting.    Surgery date: 05/26/2013  Surgery type: RYGB  Start weight at [facility]: 327 lbs on 06/27/2012  Weight today: 246.5 lbs Weight change: 34 lbs (4 pounds lean mass loss) Total weight loss: 80.5 lbs  TANITA BODY COMP RESULTS   06/10/13  07/22/13 10/16/13  BMI (kg/m^2)  37.9  34.1 30  Fat Mass (lbs)  129.5  74.5 58  Fat Free Mass (lbs)  173.5  206 188.5  Total Body Water (lbs)  127.0  151 138      Preferred Learning Style:  No preference indicated   Learning Readiness:   Ready   24-hr recall: B (AM): Breakfast omelet from [employer] (12g) Snk (AM): none L (PM): leftovers or Smart Ones (14-20g) Snk (PM): sometimes nabs  D (PM): 10-12 oz cube steak, potatoes, and cabbage  Snk (PM): none  Fluid intake: diet green tea, water, diet V8 Splash, Gatorade G2 (64+ oz) Estimated total protein intake: per patient report, not meeting needs  Medications: see list Supplementation: inconsistent; not taking B12  CBG monitoring: not checking Average CBG per patient:  Last patient reported A1c: 10% on 09/15/2013  Using straws: no Drinking while eating: yes Hair loss: unknown Carbonated beverages: about a beer a day N/V/D/C: diarrhea and vomiting when he "overdoes it" Dumping syndrome: none   Recent physical activity: none   Progress Towards Goal(s):  In progress.  Handouts given during visit include:  Phase 3A lean  protein  Bariatric fast food guide  Samples provided and patient instructed on proper use:  Unjury protein powder (strawberry - qty 2) Lot#: [id number] Exp: 07/2014  Premier protein shake (strawberry - qty 2) Lot: [id number] Exp: 04/2014   Nutritional Diagnosis:  [place]-3.3 Overweight/obesity related to past poor dietary habits and physical inactivity as evidenced by patient w/ recent RYGB surgery following dietary guidelines for continued weight loss.    Intervention:  Nutrition counseling provided. Call doctor about leg soreness.  Teaching Method Utilized: Visual Auditory   Barriers to learning/adherence to lifestyle change: food preferences  Demonstrated degree of understanding via:  Teach Back   Monitoring/Evaluation:  Dietary intake, exercise, and body weight. Follow up in 1 months for 6 month post-op visit.

## 2013-10-16 NOTE — Patient Instructions (Addendum)
-  Increase protein intake to 80 g per day (1 oz of meat has 7 grams of protein) -High protein snacks: beef jerky, eggs with hot sauce, chicken salad with low fat mayonnaise, PB2, protein shakes, yogurt, high-protein lunchables  -Add protein powder to foods  -Try to stop drinking while eating -Remember to eat slowly and chew thoroughly -Start exercising again -Try to be consistent about taking supplements every day -Start checking blood sugars again

## 2013-10-23 ENCOUNTER — Other Ambulatory Visit (INDEPENDENT_AMBULATORY_CARE_PROVIDER_SITE_OTHER): Payer: Self-pay

## 2013-10-23 ENCOUNTER — Other Ambulatory Visit (INDEPENDENT_AMBULATORY_CARE_PROVIDER_SITE_OTHER): Payer: Self-pay | Admitting: General Surgery

## 2013-10-23 DIAGNOSIS — K912 Postsurgical malabsorption, not elsewhere classified: Secondary | ICD-10-CM

## 2013-10-31 ENCOUNTER — Other Ambulatory Visit: Payer: Self-pay | Admitting: Endocrinology

## 2013-11-03 ENCOUNTER — Telehealth: Payer: Self-pay

## 2013-11-03 NOTE — Telephone Encounter (Signed)
Received a refill request for Lantus Solostar. Medication is not on current medication list. Please advise if ok to refill. Thanks!

## 2013-11-03 NOTE — Telephone Encounter (Signed)
This med has been d/c'ed

## 2013-11-03 NOTE — Telephone Encounter (Signed)
Pharmacy notified.

## 2013-12-02 ENCOUNTER — Encounter: Payer: BC Managed Care – PPO | Attending: General Surgery | Admitting: Dietician

## 2013-12-02 VITALS — Ht 76.0 in | Wt 245.0 lb

## 2013-12-02 DIAGNOSIS — Z9884 Bariatric surgery status: Secondary | ICD-10-CM | POA: Insufficient documentation

## 2013-12-02 DIAGNOSIS — Z713 Dietary counseling and surveillance: Secondary | ICD-10-CM | POA: Insufficient documentation

## 2013-12-02 DIAGNOSIS — Z09 Encounter for follow-up examination after completed treatment for conditions other than malignant neoplasm: Secondary | ICD-10-CM | POA: Insufficient documentation

## 2013-12-02 DIAGNOSIS — Z6841 Body Mass Index (BMI) 40.0 and over, adult: Secondary | ICD-10-CM

## 2013-12-02 NOTE — Patient Instructions (Addendum)
-  Increase protein intake to 80 g per day (1 oz of meat has 7 grams of protein) -High protein snacks: beef jerky, eggs with hot sauce, chicken salad with low fat mayonnaise, PB2, protein shakes, yogurt, high-protein lunchables (P3), boiled eggs  -Add protein powder to foods  -Try to stop drinking while eating -Avoid beer -Start exercising again  -Try a vitamin B12 that dissolves under your tongue ("sublingual") -Find a Calcium citrate (Celebrate Calcium chews)  -Use these as your treat instead of candy! -Add protein food to breakfast: boiled eggs and microwaveable sausage OR low carb platter from [employer]   TANITA BODY COMP RESULTS   06/10/13  07/22/13 10/16/13 12/02/13  BMI (kg/m^2)  37.9  34.1 30 29.8  Fat Mass (lbs)  129.5  74.5 58 56  Fat Free Mass (lbs)  173.5  206 188.5 189  Total Body Water (lbs)  127.0  151 138 138.5

## 2013-12-02 NOTE — Progress Notes (Signed)
  Follow-up visit:  7 months Post-Operative RYGB Surgery  Medical Nutrition Therapy:  Appt start time: 325 end time:  355  Primary concerns today: Post-operative Bariatric Surgery Nutrition Management.   Mark Oconnor returns today having lost 2 pounds. He reports his "appetite is coming back and he's doing better with proteins." Blood sugars are "bad," 220 mg/dL yesterday. Mark Oconnor reports understanding of carb-controlled diet and has attended diabetes Core classes and [facility].   Surgery date: 05/26/2013  Surgery type: RYGB  [facility] lbs on 06/27/2012  Weight today: 245 lbs Weight change: 1.5 lbs  Total weight loss: 82 lbs  TANITA BODY COMP RESULTS   06/10/13  07/22/13 10/16/13 12/02/13  BMI (kg/m^2)  37.9  34.1 30 29.8  Fat Mass (lbs)  129.5  74.5 58 56  Fat Free Mass (lbs)  173.5  206 188.5 189  Total Body Water (lbs)  127.0  151 138 138.5     Preferred Learning Style:  No preference indicated   Learning Readiness:   Ready   24-hr recall: B (AM): 1-2 bowls of Lucky Charms or frosted mini Wheats or Cap'n Crunch dry cereal with juice Snk (AM): none L (PM): 2 grilled chicken skewers OR beef and broccoli (no rice) Snk (PM): none; sometimes Lunchables or candy  D (PM): BBQ and coleslaw, 1-2 hushpuppies; cereal Snk (PM): none, maybe cereal  Fluid intake: 2 beers a day (Bud Light Lime), diet green tea, water, diet V8 Splash, Gatorade G2 (less than 64 oz per patient report) Estimated total protein intake: improving  Medications: see list (plans to start back on "diabetic pill") Supplementation: inconsistent; taking gummy B12 and calcium carbonate  CBG monitoring:  Average CBG per patient:  Last patient reported A1c: 10% on 09/15/2013  Using straws: no Drinking while eating: yes Hair loss: unknown Carbonated beverages: 2 beers a day N/V/D/C: frequent bowel movements (2-3 a day) Dumping syndrome: 3x in the last couple months   Recent physical  activity: none; "trying to get back into running"   Progress Towards Goal(s):  In progress.    Nutritional Diagnosis:  [place]-3.3 Overweight/obesity related to past poor dietary habits and physical inactivity as evidenced by patient w/ recent RYGB surgery following dietary guidelines for continued weight loss.    Intervention:  Nutrition counseling provided.  Teaching Method Utilized: Visual Auditory   Barriers to learning/adherence to lifestyle change: food preferences  Demonstrated degree of understanding via:  Teach Back   Monitoring/Evaluation:  Dietary intake, exercise, and body weight. Follow up in 2 months for 9 month post-op visit.

## 2013-12-05 ENCOUNTER — Ambulatory Visit (INDEPENDENT_AMBULATORY_CARE_PROVIDER_SITE_OTHER): Payer: BC Managed Care – PPO | Admitting: General Surgery

## 2013-12-12 ENCOUNTER — Encounter: Payer: Self-pay | Admitting: Internal Medicine

## 2013-12-12 ENCOUNTER — Ambulatory Visit (INDEPENDENT_AMBULATORY_CARE_PROVIDER_SITE_OTHER): Payer: BC Managed Care – PPO | Admitting: Internal Medicine

## 2013-12-12 ENCOUNTER — Other Ambulatory Visit (INDEPENDENT_AMBULATORY_CARE_PROVIDER_SITE_OTHER): Payer: BC Managed Care – PPO

## 2013-12-12 VITALS — BP 117/75 | HR 97 | Temp 98.3°F | Resp 16 | Ht 76.0 in | Wt 249.2 lb

## 2013-12-12 DIAGNOSIS — E1165 Type 2 diabetes mellitus with hyperglycemia: Principal | ICD-10-CM

## 2013-12-12 DIAGNOSIS — Z23 Encounter for immunization: Secondary | ICD-10-CM

## 2013-12-12 DIAGNOSIS — IMO0001 Reserved for inherently not codable concepts without codable children: Secondary | ICD-10-CM

## 2013-12-12 DIAGNOSIS — I10 Essential (primary) hypertension: Secondary | ICD-10-CM

## 2013-12-12 DIAGNOSIS — N529 Male erectile dysfunction, unspecified: Secondary | ICD-10-CM

## 2013-12-12 DIAGNOSIS — N528 Other male erectile dysfunction: Secondary | ICD-10-CM

## 2013-12-12 LAB — BASIC METABOLIC PANEL
BUN: 12 mg/dL (ref 6–23)
CHLORIDE: 100 meq/L (ref 96–112)
CO2: 28 meq/L (ref 19–32)
Calcium: 9.3 mg/dL (ref 8.4–10.5)
Creatinine, Ser: 1.1 mg/dL (ref 0.4–1.5)
GFR: 95.86 mL/min (ref >60.00)
GLUCOSE: 264 mg/dL — AB (ref 70–99)
POTASSIUM: 4.2 meq/L (ref 3.5–5.1)
Sodium: 137 mEq/L (ref 135–145)

## 2013-12-12 LAB — HEMOGLOBIN A1C: Hgb A1c MFr Bld: 11.2 % — ABNORMAL HIGH (ref 4.6–6.5)

## 2013-12-12 MED ORDER — PNEUMOCOCCAL VAC POLYVALENT 25 MCG/0.5ML IJ INJ: 0.5000 mL | INJECTION | INTRAMUSCULAR | Status: DC

## 2013-12-12 MED ORDER — PNEUMOCOCCAL VAC POLYVALENT 25 MCG/0.5ML IJ INJ: 0.5000 mL | INJECTION | Freq: Once | INTRAMUSCULAR | Status: DC

## 2013-12-12 MED ORDER — SITAGLIPTIN PHOS-METFORMIN HCL 50-500 MG PO TABS: 1.0000 | ORAL_TABLET | Freq: Two times a day (BID) | ORAL | Status: DC

## 2013-12-12 MED ORDER — OLMESARTAN-AMLODIPINE-HCTZ 40-5-12.5 MG PO TABS: 1.0000 | ORAL_TABLET | Freq: Every day | ORAL | Status: DC

## 2013-12-12 MED ORDER — INSULIN DETEMIR 100 UNIT/ML FLEXPEN: 30.0000 [IU] | PEN_INJECTOR | Freq: Every day | SUBCUTANEOUS | Status: DC

## 2013-12-12 MED ORDER — SILDENAFIL CITRATE 100 MG PO TABS: 50.0000 mg | ORAL_TABLET | Freq: Every day | ORAL | Status: DC | PRN

## 2013-12-12 NOTE — Progress Notes (Signed)
Pre visit review using our clinic review tool, if applicable. No additional management support is needed unless otherwise documented below in the visit note. 

## 2013-12-12 NOTE — Patient Instructions (Signed)

## 2013-12-12 NOTE — Progress Notes (Signed)
Subjective:    Patient ID: Mark Oconnor, male    DOB: 18-Jun-1980, 33 y.o.   MRN: [id number]  Diabetes He presents for his follow-up diabetic visit. He has type 2 diabetes mellitus. His disease course has been worsening. There are no hypoglycemic associated symptoms. Pertinent negatives for hypoglycemia include no dizziness, headaches or tremors. Associated symptoms include polydipsia, polyphagia and polyuria. Pertinent negatives for diabetes include no blurred vision, no chest pain, no fatigue, no foot paresthesias, no foot ulcerations, no visual change, no weakness and no weight loss. There are no hypoglycemic complications. Symptoms are worsening. There are no diabetic complications. Current diabetic treatment includes oral agent (monotherapy). He is compliant with treatment most of the time. His weight is stable. He is following a generally unhealthy diet. When asked about meal planning, he reported none. He has not had a previous visit with a dietician. He participates in exercise intermittently. His home blood glucose trend is increasing steadily. His breakfast blood glucose range is generally >200 mg/dl. His lunch blood glucose range is generally >200 mg/dl. His dinner blood glucose range is generally >200 mg/dl. His highest blood glucose is >200 mg/dl. His overall blood glucose range is >200 mg/dl. An ACE inhibitor/angiotensin II receptor blocker is being taken. He does not see a podiatrist.Eye exam is not current.      Review of Systems  Constitutional: Negative.  Negative for fever, chills, weight loss, diaphoresis, appetite change and fatigue.  HENT: Negative.   Eyes: Negative.  Negative for blurred vision.  Respiratory: Negative.  Negative for cough, choking, chest tightness, shortness of breath and stridor.   Cardiovascular: Negative.  Negative for chest pain, palpitations and leg swelling.  Gastrointestinal: Negative.  Negative for nausea, vomiting, abdominal pain, diarrhea,  constipation and blood in stool.  Endocrine: Positive for polydipsia, polyphagia and polyuria.  Genitourinary: Negative.   Musculoskeletal: Negative.  Negative for arthralgias, back pain, myalgias and neck pain.  Skin: Negative.   Allergic/Immunologic: Negative.   Neurological: Negative.  Negative for dizziness, tremors, syncope, weakness, light-headedness, numbness and headaches.  Hematological: Negative.  Negative for adenopathy.  Psychiatric/Behavioral: Negative.        Objective:   Physical Exam  Vitals reviewed. Constitutional: He is oriented to person, place, and time. He appears well-developed and well-nourished. No distress.  HENT:  Head: Normocephalic and atraumatic.  Mouth/Throat: Oropharynx is clear and moist. No oropharyngeal exudate.  Eyes: Conjunctivae are normal. Right eye exhibits no discharge. Left eye exhibits no discharge. No scleral icterus.  Neck: Normal range of motion. Neck supple. No JVD present. No tracheal deviation present. No thyromegaly present.  Cardiovascular: Normal rate, regular rhythm, normal heart sounds and intact distal pulses.  Exam reveals no gallop and no friction rub.   No murmur heard. Pulmonary/Chest: Effort normal and breath sounds normal. No stridor. No respiratory distress. He has no wheezes. He has no rales. He exhibits no tenderness.  Abdominal: Soft. Bowel sounds are normal. He exhibits no distension and no mass. There is no tenderness. There is no rebound and no guarding.  Musculoskeletal: Normal range of motion. He exhibits no edema.  Lymphadenopathy:    He has no cervical adenopathy.  Neurological: He is oriented to person, place, and time.  Skin: Skin is warm and dry. No rash noted. He is not diaphoretic. No erythema. No pallor.     Lab Results  Component Value Date   WBC 8.1 05/28/2013   HGB 14.0 05/28/2013   HCT 43.6 05/28/2013   PLT 229  05/28/2013   GLUCOSE 70 06/13/2013   CHOL 127 06/13/2013   TRIG 181.0* 06/13/2013   HDL 29.90*  06/13/2013   LDLDIRECT 91.2 12/10/2012   LDLCALC 61 06/13/2013   ALT 18 08/28/2012   AST 13 08/28/2012   NA 138 06/13/2013   K 4.1 06/13/2013   CL 103 06/13/2013   CREATININE 1.1 06/13/2013   BUN 10 06/13/2013   CO2 22 06/13/2013   TSH 0.72 08/28/2012   PSA 0.53 04/26/2011   HGBA1C 9.9* 09/15/2013   MICROALBUR 0.3 09/15/2013       Assessment & Plan:

## 2013-12-12 NOTE — Assessment & Plan Note (Signed)
He wants to change to viagra

## 2013-12-12 NOTE — Assessment & Plan Note (Signed)
His blood sugars are not well controlled - I think he need to start insulin He agrees to restart janumet as well I have asked him to have his annual eye exam done and to see a diabetic educator

## 2013-12-12 NOTE — Assessment & Plan Note (Signed)
His BP is well controlled I will monitor his lytes and renal function today 

## 2013-12-15 ENCOUNTER — Other Ambulatory Visit: Payer: Self-pay | Admitting: Endocrinology

## 2013-12-23 ENCOUNTER — Telehealth: Payer: Self-pay | Admitting: Internal Medicine

## 2013-12-23 NOTE — Telephone Encounter (Signed)
Patient would like someone to go over his lab results with him over the phone. He states he never received the results letter we sent him. CB# [phone]

## 2013-12-23 NOTE — Telephone Encounter (Signed)
Called pt in form him md mail out lab letter gave him md response...Raechel Chute/lmb

## 2014-01-02 ENCOUNTER — Telehealth: Payer: Self-pay | Admitting: *Deleted

## 2014-01-02 DIAGNOSIS — E1165 Type 2 diabetes mellitus with hyperglycemia: Principal | ICD-10-CM

## 2014-01-02 DIAGNOSIS — IMO0001 Reserved for inherently not codable concepts without codable children: Secondary | ICD-10-CM

## 2014-01-02 MED ORDER — INSULIN DETEMIR 100 UNIT/ML FLEXPEN: 30.0000 [IU] | PEN_INJECTOR | Freq: Every day | SUBCUTANEOUS | Status: DC

## 2014-01-02 NOTE — Telephone Encounter (Signed)
Pt stated md did not rx on his levemir insulin. Inform pt per chart md printed rx verified if he gave rx to him. Pt stated he only gave him a sample. Inform pt will send to [facility]...Raechel Chute

## 2014-01-23 ENCOUNTER — Ambulatory Visit (INDEPENDENT_AMBULATORY_CARE_PROVIDER_SITE_OTHER): Payer: BC Managed Care – PPO | Admitting: General Surgery

## 2014-01-26 ENCOUNTER — Other Ambulatory Visit: Payer: Self-pay | Admitting: Endocrinology

## 2014-02-03 ENCOUNTER — Encounter: Payer: BC Managed Care – PPO | Attending: General Surgery | Admitting: Dietician

## 2014-02-03 VITALS — Ht 76.0 in | Wt 245.0 lb

## 2014-02-03 DIAGNOSIS — Z713 Dietary counseling and surveillance: Secondary | ICD-10-CM | POA: Insufficient documentation

## 2014-02-03 DIAGNOSIS — Z09 Encounter for follow-up examination after completed treatment for conditions other than malignant neoplasm: Secondary | ICD-10-CM | POA: Diagnosis present

## 2014-02-03 DIAGNOSIS — Z6841 Body Mass Index (BMI) 40.0 and over, adult: Secondary | ICD-10-CM

## 2014-02-03 DIAGNOSIS — Z9884 Bariatric surgery status: Secondary | ICD-10-CM | POA: Insufficient documentation

## 2014-02-03 NOTE — Progress Notes (Signed)
  Follow-up visit:  8.5 months Post-Operative RYGB Surgery  Medical Nutrition Therapy:  Appt start time: 340 end time: 415  Primary concerns today: Post-operative Bariatric Surgery Nutrition Management.   Mr. Mark Oconnor returns today having maintained his weight since last visit. His HgbA1c was 11.2% in August. He states that he wants to get his diabetes under control rather than focus on weight right now. Mark Oconnor tests his blood sugar 1x a day when he gets home from work around 4pm (averaging 150-160 mg/dL). Recently went on Levemir at the beginning of August (30 units every night). However, he does not take it every night as recommended because he reports he is gaining weight. He attended core classes last year. Diagnosed with type 2 diabetes in 2011-2012. Can tolerate all foods except pizza.   Surgery date: 05/26/2013  Surgery type: RYGB  [facility]: 327 lbs on 06/27/2012  Weight today: 245 lbs Weight change: 1.5 lbs  Total weight loss: 82 lbs  TANITA BODY COMP RESULTS   06/10/13  07/22/13 10/16/13 12/02/13 02/03/14  BMI (kg/m^2)  37.9  34.1 30 29.8 29.9  Fat Mass (lbs)  129.5  74.5 58 56 57.5  Fat Free Mass (lbs)  173.5  206 188.5 189 188  Total Body Water (lbs)  127.0  151 138 138.5 137.5     Preferred Learning Style:  No preference indicated   Learning Readiness:   Ready   24-hr recall: B (AM): steak, egg, and cheese biscuit Snk (AM): none L (PM): salad or 1/2 tuna sub Snk (PM):  D (PM): 1 taco or spaghetti or meatloaf Snk (PM):   Snacks on nabs, peanuts, and trail mix throughout the day  Fluid intake: 3 beers a week, diet green tea, diet Pepsi, water, diet V8 Splash, Gatorade (64 oz per patient report) Estimated total protein intake: "getting closer to 80 grams"  Medications: see list  Supplementation: inconsistent  CBG monitoring: 1x a day Average CBG per patient: 150-160 mg/dL Last patient reported Z6XA1c: 11.2%  Using straws: no Drinking while eating:  yes Hair loss: unknown Carbonated beverages: diet Pepsi and beer N/V/D/C: none Dumping syndrome: none recently  Recent physical activity: none; "trying to get back into running"   Progress Towards Goal(s):  In progress.    Nutritional Diagnosis:  [place]-3.3 Overweight/obesity related to past poor dietary habits and physical inactivity as evidenced by patient w/ recent RYGB surgery following dietary guidelines for continued weight loss.    Intervention:  Nutrition counseling provided.  Teaching Method Utilized: Visual Auditory   Barriers to learning/adherence to lifestyle change: food preferences  Demonstrated degree of understanding via:  Teach Back   Monitoring/Evaluation:  Dietary intake, exercise, and body weight. Patient to follow up with Pincus LargeBeverly Paddock, RD, CDE.

## 2014-02-03 NOTE — Patient Instructions (Addendum)
Try to maintain weight for now.. Priority #1 is controlling diabetes!  -Increase protein intake to 80 g per day (1 oz of meat has 7 grams of protein) -High protein snacks: beef jerky, eggs with hot sauce, chicken salad with low fat mayonnaise, PB2, protein shakes, yogurt, high-protein lunchables (P3), boiled eggs  -Add protein powder to foods -Try [place] English muffin rather than biscuit -Try to stop drinking while eating -Start exercising again   -Consider getting a pedometer and set a step goal -Try a vitamin B12 that dissolves under your tongue ("sublingual") -Find a Calcium citrate (Celebrate Calcium chews or Bariatric Advantage chews)  -Use these as your treat instead of candy! -Have something to eat with carbs and protein every 3-5 hours -Avoid all sugary beverages -Keep candy with you for low blood sugar treatment  TANITA BODY COMP RESULTS   06/10/13  07/22/13 10/16/13 12/02/13 02/03/14  BMI (kg/m^2)  37.9  34.1 30 29.8 29.9  Fat Mass (lbs)  129.5  74.5 58 56 57.5  Fat Free Mass (lbs)  173.5  206 188.5 189 188  Total Body Water (lbs)  127.0  151 138 138.5 137.5

## 2014-02-11 ENCOUNTER — Ambulatory Visit: Payer: BC Managed Care – PPO | Admitting: *Deleted

## 2014-06-30 ENCOUNTER — Other Ambulatory Visit: Payer: Self-pay | Admitting: Endocrinology

## 2014-07-01 ENCOUNTER — Other Ambulatory Visit: Payer: Self-pay | Admitting: *Deleted

## 2014-07-01 MED ORDER — CANAGLIFLOZIN 300 MG PO TABS: 300.0000 mg | ORAL_TABLET | Freq: Every day | ORAL | Status: DC

## 2014-07-01 NOTE — Telephone Encounter (Signed)
Left msg on triage requesting refills on his Ivokana to be [facility]...Raechel Chute/lmb

## 2014-07-07 ENCOUNTER — Encounter: Payer: Self-pay | Admitting: Endocrinology

## 2014-07-07 ENCOUNTER — Ambulatory Visit (INDEPENDENT_AMBULATORY_CARE_PROVIDER_SITE_OTHER): Payer: BLUE CROSS/BLUE SHIELD | Admitting: Endocrinology

## 2014-07-07 VITALS — BP 122/80 | HR 81 | Temp 98.6°F | Ht 76.0 in | Wt 254.0 lb

## 2014-07-07 DIAGNOSIS — E119 Type 2 diabetes mellitus without complications: Secondary | ICD-10-CM

## 2014-07-07 DIAGNOSIS — Z9884 Bariatric surgery status: Secondary | ICD-10-CM

## 2014-07-07 NOTE — Progress Notes (Signed)
Subjective:    Patient ID: Mark Oconnor, male    DOB: 11/08/1980, 34 y.o.   MRN: [id number]  HPI  Pt returns for f/u of diabetes mellitus: DM type: Insulin-requiring type 2 Dx'ed: 2011 Complications: none Therapy: insulin since dx DKA: never Severe hypoglycemia: never Pancreatitis: never Other: he had gastric bypass surgery in 2015--he has lost 80 lbs since then Interval history: no cbg record, but states cbg's vary from 120-280.  It is in general higher as the day goes on.  He feels the insulin is causing weight gain, so he often skips it.  He has slight cramps of the hands and feet, but no assoc numbness.  He does not take his B-12 supplement.   Past Medical History  Diagnosis Date  . Diabetes mellitus   . Hyperlipidemia   . Hypertension   . Morbid obesity   . GERD (gastroesophageal reflux disease)   . Sleep apnea 05-21-13    denies.Stop/Bang score =4 with PAT visit    Past Surgical History  Procedure Laterality Date  . Breath tek h pylori N/A 07/12/2012    Procedure: BREATH TEK H PYLORI;  Surgeon: Mariella SaaBenjamin T Hoxworth, MD;  [facility];  Service: General;  Laterality: N/A;  . Gastric roux-en-y N/A 05/26/2013    Procedure: LAPAROSCOPIC ROUX-EN-Y GASTRIC BYPASS WITH UPPER ENDOSCOPY;  Surgeon: Mariella SaaBenjamin T Hoxworth, MD;  Location: [facility];  Service: General;  Laterality: N/A;    History   Social History  . Marital Status: Single    Spouse Name: N/A  . Number of Children: N/A  . Years of Education: N/A   Occupational History  . Not on file.   Social History Main Topics  . Smoking status: Former Smoker -- 0.50 packs/day for 15 years    Types: Cigarettes    Quit date: 12/07/2011  . Smokeless tobacco: Never Used  . Alcohol Use: 6.0 oz/week    10 Cans of beer per week     Comment: occasional to rare  . Drug Use: No  . Sexual Activity: Yes   Other Topics Concern  . Not on file   Social History Narrative    Current Outpatient Prescriptions on File Prior  to Visit  Medication Sig Dispense Refill  . atorvastatin (LIPITOR) 40 MG tablet Take 1 tablet (40 mg total) by mouth daily. 30 tablet 3  . canagliflozin (INVOKANA) 300 MG TABS tablet Take 300 mg by mouth daily. 90 tablet 2  . glucose blood (ONE TOUCH ULTRA TEST) test strip USE TO TEST BLOOD SUGAR TWICE DAILY    . Olmesartan-Amlodipine-HCTZ (TRIBENZOR) 40-5-12.5 MG TABS Take 1 tablet by mouth daily. 30 tablet 11  . Olmesartan-Amlodipine-HCTZ (TRIBENZOR) 40-5-12.5 MG TABS Take 1 tablet by mouth every day. *APPOINTMENT NEEDED FOR FURTHER REFILLS* 30 tablet 0  . ONE TOUCH ULTRA TEST test strip USE TO TEST BLOOD SUGAR TWICE DAILY 100 each 11  . sildenafil (VIAGRA) 100 MG tablet Take 0.5-1 tablets (50-100 mg total) by mouth daily as needed for erectile dysfunction. 10 tablet 11  . sitaGLIPtin-metformin (JANUMET) 50-500 MG per tablet Take 1 tablet by mouth 2 (two) times daily with a meal. 180 tablet 1  . albuterol (PROVENTIL HFA;VENTOLIN HFA) 108 (90 BASE) MCG/ACT inhaler Inhale 2 puffs into the lungs every 6 (six) hours as needed for wheezing or shortness of breath.     Current Facility-Administered Medications on File Prior to Visit  Medication Dose Route Frequency Provider Last Rate Last Dose  . pneumococcal 23 valent  vaccine (PNU-IMMUNE) injection 0.5 mL  0.5 mL Intramuscular Once Etta Grandchild, MD        Allergies  Allergen Reactions  . Other Itching    Tree nuts     Family History  Problem Relation Age of Onset  . Diabetes Father   . Cancer Neg Hx   . Heart disease Neg Hx   . Hyperlipidemia Neg Hx   . Hypertension Neg Hx   . Diabetes Mother   . Diabetes Brother     one brother  diabetic    BP 122/80 mmHg  Pulse 81  Temp(Src) 98.6 F (37 C) (Oral)  Ht  (1.93 m)  Wt 254 lb (115.214 kg)  BMI 30.93 kg/m2  SpO2 98%    Review of Systems He denies hypoglycemia and diarrhea.  He seldom has n/v.      Objective:   Physical Exam VITAL SIGNS:  See vs page GENERAL: no  distress Pulses: dorsalis pedis intact bilat.   MSK: no deformity of the feet CV: no leg edema.  Skin:  no ulcer on the feet.  normal color and temp on the feet. Neuro: sensation is intact to touch on the feet.    Lab Results  Component Value Date   HGBA1C 12.2* 07/07/2014   B-12=normal  Vit-D=21    Assessment & Plan:  DM: severe exacerbation Numbness, new, prob neuropathic Vit-D deficiency, due to gastric bypass.   Noncompliance with cbg recording and insulin: I'll work around this as best I can    Patient is advised the following: Patient Instructions  Please continue the same medication for your diabetes.  A diabetes blood test is being requested for you today.  We'll contact you with results.  If it is high, we can add "acarbose."  check your blood sugar once a day.  vary the time of day when you check, between before the 3 meals, and at bedtime.  also check if you have symptoms of your blood sugar being too high or too low.  please keep a record of the readings and bring it to your next appointment here.  You can write it on any piece of paper.  please call us sooner if your blood sugar goes below 70, or if you have a lot of readings over 200.   Please come back for a follow-up appointment in 3 months.   It is really important to take the insulin each day. Your vitamin-D is slightly low. Take vitamin-D, 5000 units daily

## 2014-07-07 NOTE — Patient Instructions (Addendum)
Please continue the same medication for your diabetes.  A diabetes blood test is being requested for you today.  We'll contact you with results.  If it is high, we can add "acarbose."  check your blood sugar once a day.  vary the time of day when you check, between before the 3 meals, and at bedtime.  also check if you have symptoms of your blood sugar being too high or too low.  please keep a record of the readings and bring it to your next appointment here.  You can write it on any piece of paper.  please call us sooner if your blood sugar goes below 70, or if you have a lot of readings over 200.   Please come back for a follow-up appointment in 3 months.

## 2014-07-08 LAB — BASIC METABOLIC PANEL
BUN: 13 mg/dL (ref 6–23)
CHLORIDE: 101 meq/L (ref 96–112)
CO2: 25 mEq/L (ref 19–32)
Calcium: 9.6 mg/dL (ref 8.4–10.5)
Creatinine, Ser: 1.11 mg/dL (ref 0.40–1.50)
GFR: 97.52 mL/min (ref >60.00)
Glucose, Bld: 76 mg/dL (ref 70–99)
Potassium: 3.4 mEq/L — ABNORMAL LOW (ref 3.5–5.1)
SODIUM: 136 meq/L (ref 135–145)

## 2014-07-08 LAB — VITAMIN D 25 HYDROXY (VIT D DEFICIENCY, FRACTURES): VITD: 21.43 ng/mL — ABNORMAL LOW (ref 30.00–100.00)

## 2014-07-08 LAB — VITAMIN B12: Vitamin B-12: 427 pg/mL (ref 211–911)

## 2014-07-08 LAB — HEMOGLOBIN A1C: HEMOGLOBIN A1C: 12.2 % — AB (ref 4.6–6.5)

## 2014-07-09 ENCOUNTER — Other Ambulatory Visit (INDEPENDENT_AMBULATORY_CARE_PROVIDER_SITE_OTHER): Payer: BLUE CROSS/BLUE SHIELD

## 2014-07-09 ENCOUNTER — Ambulatory Visit (INDEPENDENT_AMBULATORY_CARE_PROVIDER_SITE_OTHER): Payer: BLUE CROSS/BLUE SHIELD | Admitting: Internal Medicine

## 2014-07-09 VITALS — BP 102/62 | HR 58 | Temp 98.0°F | Resp 16 | Ht 76.0 in | Wt 255.0 lb

## 2014-07-09 DIAGNOSIS — I10 Essential (primary) hypertension: Secondary | ICD-10-CM | POA: Diagnosis not present

## 2014-07-09 DIAGNOSIS — Z0000 Encounter for general adult medical examination without abnormal findings: Secondary | ICD-10-CM

## 2014-07-09 DIAGNOSIS — R7989 Other specified abnormal findings of blood chemistry: Secondary | ICD-10-CM | POA: Diagnosis not present

## 2014-07-09 DIAGNOSIS — E785 Hyperlipidemia, unspecified: Secondary | ICD-10-CM

## 2014-07-09 DIAGNOSIS — Z9884 Bariatric surgery status: Secondary | ICD-10-CM

## 2014-07-09 DIAGNOSIS — E118 Type 2 diabetes mellitus with unspecified complications: Secondary | ICD-10-CM

## 2014-07-09 LAB — URINALYSIS, ROUTINE W REFLEX MICROSCOPIC
Bilirubin Urine: NEGATIVE
Hgb urine dipstick: NEGATIVE
Ketones, ur: 15 — AB
LEUKOCYTES UA: NEGATIVE
NITRITE: NEGATIVE
RBC / HPF: NONE SEEN (ref 0–?)
Specific Gravity, Urine: 1.01 (ref 1.000–1.030)
TOTAL PROTEIN, URINE-UPE24: NEGATIVE
Urine Glucose: >=1000 — AB
Urobilinogen, UA: 0.2 (ref 0.0–1.0)
pH: 6 (ref 5.0–8.0)

## 2014-07-09 LAB — MICROALBUMIN / CREATININE URINE RATIO
CREATININE, U: 53.7 mg/dL
MICROALB UR: 0.1 mg/dL (ref 0.0–1.9)
Microalb Creat Ratio: 0.2 mg/g (ref 0.0–30.0)

## 2014-07-09 LAB — CBC WITH DIFFERENTIAL/PLATELET
BASOS PCT: 0.2 % (ref 0.0–3.0)
Basophils Absolute: 0 10*3/uL (ref 0.0–0.1)
EOS ABS: 0.1 10*3/uL (ref 0.0–0.7)
EOS PCT: 1.2 % (ref 0.0–5.0)
HCT: 45.4 % (ref 39.0–52.0)
Hemoglobin: 15.3 g/dL (ref 13.0–17.0)
Lymphocytes Relative: 27.7 % (ref 12.0–46.0)
Lymphs Abs: 2.2 10*3/uL (ref 0.7–4.0)
MCHC: 33.6 g/dL (ref 30.0–36.0)
MCV: 77.1 fl — ABNORMAL LOW (ref 78.0–100.0)
MONO ABS: 0.5 10*3/uL (ref 0.1–1.0)
Monocytes Relative: 5.8 % (ref 3.0–12.0)
NEUTROS PCT: 65.1 % (ref 43.0–77.0)
Neutro Abs: 5.1 10*3/uL (ref 1.4–7.7)
Platelets: 218 10*3/uL (ref 150.0–400.0)
RBC: 5.89 Mil/uL — AB (ref 4.22–5.81)
RDW: 14.1 % (ref 11.5–15.5)
WBC: 7.8 10*3/uL (ref 4.0–10.5)

## 2014-07-09 LAB — LIPID PANEL
CHOL/HDL RATIO: 5
Cholesterol: 211 mg/dL — ABNORMAL HIGH (ref 0–200)
HDL: 44.6 mg/dL (ref >39.00)
NONHDL: 166.4
Triglycerides: 265 mg/dL — ABNORMAL HIGH (ref 0.0–149.0)
VLDL: 53 mg/dL — AB (ref 0.0–40.0)

## 2014-07-09 LAB — COMPREHENSIVE METABOLIC PANEL
ALBUMIN: 4.5 g/dL (ref 3.5–5.2)
ALT: 31 U/L (ref 0–53)
AST: 32 U/L (ref 0–37)
Alkaline Phosphatase: 54 U/L (ref 39–117)
BILIRUBIN TOTAL: 0.4 mg/dL (ref 0.2–1.2)
BUN: 16 mg/dL (ref 6–23)
CHLORIDE: 99 meq/L (ref 96–112)
CO2: 29 meq/L (ref 19–32)
CREATININE: 1.14 mg/dL (ref 0.40–1.50)
Calcium: 9.4 mg/dL (ref 8.4–10.5)
GFR: 94.57 mL/min (ref >60.00)
GLUCOSE: 190 mg/dL — AB (ref 70–99)
Potassium: 4.4 mEq/L (ref 3.5–5.1)
Sodium: 135 mEq/L (ref 135–145)
Total Protein: 7.7 g/dL (ref 6.0–8.3)

## 2014-07-09 LAB — IBC PANEL
IRON: 102 ug/dL (ref 42–165)
Saturation Ratios: 32.2 % (ref 20.0–50.0)
TRANSFERRIN: 226 mg/dL (ref 212.0–360.0)

## 2014-07-09 LAB — FERRITIN: Ferritin: 197.4 ng/mL (ref 22.0–322.0)

## 2014-07-09 LAB — FOLATE: Folate: 14.4 ng/mL (ref >5.9)

## 2014-07-09 LAB — TSH: TSH: 0.72 u[IU]/mL (ref 0.35–4.50)

## 2014-07-09 LAB — LDL CHOLESTEROL, DIRECT: Direct LDL: 130 mg/dL

## 2014-07-09 MED ORDER — SITAGLIPTIN PHOS-METFORMIN HCL 50-1000 MG PO TABS: 1.0000 | ORAL_TABLET | Freq: Two times a day (BID) | ORAL | Status: DC

## 2014-07-09 NOTE — Patient Instructions (Signed)

## 2014-07-09 NOTE — Progress Notes (Signed)
Pre visit review using our clinic review tool, if applicable. No additional management support is needed unless otherwise documented below in the visit note. 

## 2014-07-09 NOTE — Progress Notes (Signed)
Subjective:    Patient ID: Mark Oconnor, male    DOB: 27-Feb-1981, 34 y.o.   MRN: [id number]  Diabetes He presents for his follow-up diabetic visit. He has type 2 diabetes mellitus. His disease course has been stable. There are no hypoglycemic associated symptoms. Pertinent negatives for hypoglycemia include no dizziness, headaches or seizures. Associated symptoms include polydipsia. Pertinent negatives for diabetes include no blurred vision, no chest pain, no fatigue, no foot paresthesias, no foot ulcerations, no polyphagia, no polyuria, no visual change, no weakness and no weight loss. There are no hypoglycemic complications. There are no diabetic complications. Current diabetic treatment includes oral agent (triple therapy) and insulin injections. He is compliant with treatment most of the time. His weight is decreasing steadily. He is following a generally healthy diet. Meal planning includes avoidance of concentrated sweets. He participates in exercise intermittently. There is no change in his home blood glucose trend. An ACE inhibitor/angiotensin II receptor blocker is being taken. He does not see a podiatrist.Eye exam is not current.      Review of Systems  Constitutional: Negative.  Negative for fever, chills, weight loss, diaphoresis, appetite change and fatigue.  HENT: Negative.   Eyes: Negative.  Negative for blurred vision.  Respiratory: Positive for apnea. Negative for cough, choking, chest tightness, shortness of breath, wheezing and stridor.   Cardiovascular: Negative.  Negative for chest pain, palpitations and leg swelling.  Gastrointestinal: Negative.  Negative for nausea, vomiting, abdominal pain, diarrhea, constipation and blood in stool.  Endocrine: Positive for polydipsia. Negative for polyphagia and polyuria.  Genitourinary: Negative.   Musculoskeletal: Negative.  Negative for myalgias, back pain, joint swelling and arthralgias.  Skin: Negative.  Negative for rash.    Allergic/Immunologic: Negative.   Neurological: Negative.  Negative for dizziness, seizures, weakness, light-headedness and headaches.  Hematological: Negative.  Negative for adenopathy. Does not bruise/bleed easily.  Psychiatric/Behavioral: Negative.        Objective:   Physical Exam  Constitutional: He is oriented to person, place, and time. He appears well-developed and well-nourished. No distress.  HENT:  Head: Normocephalic and atraumatic.  Mouth/Throat: Oropharynx is clear and moist. No oropharyngeal exudate.  Eyes: Conjunctivae are normal. Right eye exhibits no discharge. Left eye exhibits no discharge. No scleral icterus.  Neck: Normal range of motion. Neck supple. No JVD present. No tracheal deviation present. No thyromegaly present.  Cardiovascular: Normal rate, regular rhythm, normal heart sounds and intact distal pulses.  Exam reveals no gallop and no friction rub.   No murmur heard. Pulmonary/Chest: Effort normal and breath sounds normal. No stridor. No respiratory distress. He has no wheezes. He has no rales. He exhibits no tenderness.  Abdominal: Soft. Bowel sounds are normal. He exhibits no distension and no mass. There is no tenderness. There is no rebound and no guarding. Hernia confirmed negative in the right inguinal area and confirmed negative in the left inguinal area.  Genitourinary: Testes normal and penis normal. Right testis shows no mass, no swelling and no tenderness. Right testis is descended. Left testis shows no mass, no swelling and no tenderness. Left testis is descended. Circumcised. No penile erythema or penile tenderness. No discharge found.  Musculoskeletal: Normal range of motion. He exhibits no edema or tenderness.  Lymphadenopathy:    He has no cervical adenopathy.       Right: No inguinal adenopathy present.       Left: No inguinal adenopathy present.  Neurological: He is oriented to person, place, and time.  Skin:  Skin is warm and dry. No rash  noted. He is not diaphoretic. No erythema. No pallor.  Vitals reviewed.    Lab Results  Component Value Date   WBC 8.1 05/28/2013   HGB 14.0 05/28/2013   HCT 43.6 05/28/2013   PLT 229 05/28/2013   GLUCOSE 76 07/07/2014   CHOL 127 06/13/2013   TRIG 181.0* 06/13/2013   HDL 29.90* 06/13/2013   LDLDIRECT 91.2 12/10/2012   LDLCALC 61 06/13/2013   ALT 18 08/28/2012   AST 13 08/28/2012   NA 136 07/07/2014   K 3.4* 07/07/2014   CL 101 07/07/2014   CREATININE 1.11 07/07/2014   BUN 13 07/07/2014   CO2 25 07/07/2014   TSH 0.72 08/28/2012   PSA 0.53 04/26/2011   HGBA1C 12.2* 07/07/2014   MICROALBUR 0.3 09/15/2013       Assessment & Plan:

## 2014-07-10 LAB — PTH, INTACT AND CALCIUM
CALCIUM: 9.8 mg/dL (ref 8.4–10.5)
PTH: 28 pg/mL (ref 14–64)

## 2014-07-12 ENCOUNTER — Encounter: Payer: Self-pay | Admitting: Internal Medicine

## 2014-07-12 NOTE — Assessment & Plan Note (Signed)
I will check his labs to screen for malabsorption and vitamin deficiencies

## 2014-07-12 NOTE — Assessment & Plan Note (Signed)
His blood sugars are not well controlled He is working with ENDO to control his blood sugars better

## 2014-07-12 NOTE — Assessment & Plan Note (Signed)
He has achieved his LDL goal 

## 2014-07-12 NOTE — Assessment & Plan Note (Signed)
He refused a flu vax today Exam done Labs ordered  Pt ed material was given 

## 2014-07-12 NOTE — Assessment & Plan Note (Signed)
His BP is well controlled Lytes and renal function are stable 

## 2014-07-13 LAB — VITAMIN B6: Vitamin B6: 36.6 ng/mL — ABNORMAL HIGH (ref 2.1–21.7)

## 2014-07-13 LAB — VITAMIN B1: Vitamin B1 (Thiamine): 13 nmol/L (ref 8–30)

## 2014-07-14 ENCOUNTER — Encounter: Payer: Self-pay | Admitting: Internal Medicine

## 2014-07-14 LAB — ZINC: Zinc: 61 ug/dL (ref 60–130)

## 2014-08-09 ENCOUNTER — Other Ambulatory Visit: Payer: Self-pay | Admitting: Endocrinology

## 2014-08-10 NOTE — Telephone Encounter (Signed)
Please refer request to dr Yetta Barrejones, so his name can go on the rx

## 2014-08-10 NOTE — Telephone Encounter (Signed)
Please advise if ok to refill rx. Medication is not on current medication list.

## 2014-08-11 NOTE — Telephone Encounter (Signed)
See note below from Dr. Everardo AllEllison.

## 2014-08-18 ENCOUNTER — Encounter: Payer: Self-pay | Admitting: Endocrinology

## 2014-08-18 ENCOUNTER — Ambulatory Visit (INDEPENDENT_AMBULATORY_CARE_PROVIDER_SITE_OTHER): Payer: BLUE CROSS/BLUE SHIELD | Admitting: Endocrinology

## 2014-08-18 VITALS — BP 136/82 | HR 71 | Temp 98.7°F | Ht 76.0 in | Wt 253.0 lb

## 2014-08-18 DIAGNOSIS — Z9884 Bariatric surgery status: Secondary | ICD-10-CM

## 2014-08-18 NOTE — Patient Instructions (Addendum)
check your blood sugar once a day.  vary the time of day when you check, between before the 3 meals, and at bedtime.  also check if you have symptoms of your blood sugar being too high or too low.  please keep a record of the readings and bring it to your next appointment here.  You can write it on any piece of paper.  please call us sooner if your blood sugar goes below 70, or if you have a lot of readings over 200.   Please come back for a follow-up appointment in 3 months.  Your blood sugar is really high. It is really important to take the insulin each day.  Take 30 units each day, no matter what your blood sugar is.   Please call next week, to tell us how the blood sugar is.   Please see a dietician specialist.  you will receive a phone call, about a day and time for an appointment

## 2014-08-18 NOTE — Progress Notes (Signed)
Subjective:    Patient ID: Mark Oconnor, male    DOB: [birth year], 34 y.o.   MRN: [id number]  HPI Pt returns for f/u of diabetes mellitus: DM type: Insulin-requiring type 2 Dx'ed: 2011 Complications: none Therapy: insulin since dx DKA: never Severe hypoglycemia: never Pancreatitis: never Other: he had gastric bypass surgery in 2015--he has lost 80 lbs since then Interval history: He takes the insulin approx 3 days per week.  He has urinary frequency.  no cbg record, but states cbg's are in the 200's.  He says he did not take insulin yesterday, so he took 80 units this am.  Past Medical History  Diagnosis Date  . Diabetes mellitus   . Hyperlipidemia   . Hypertension   . Morbid obesity   . GERD (gastroesophageal reflux disease)   . Sleep apnea 05-21-13    denies.Stop/Bang score =4 with PAT visit    Past Surgical History  Procedure Laterality Date  . Breath tek h pylori N/A 07/12/2012    Procedure: BREATH TEK H PYLORI;  Surgeon: Mariella Saa, MD;  Location: [facility];  Service: General;  Laterality: N/A;  . Gastric roux-en-y N/A 05/26/2013    Procedure: LAPAROSCOPIC ROUX-EN-Y GASTRIC BYPASS WITH UPPER ENDOSCOPY;  Surgeon: Mariella Saa, MD;  Location: [facility];  Service: General;  Laterality: N/A;    History   Social History  . Marital Status: Single    Spouse Name: N/A  . Number of Children: N/A  . Years of Education: N/A   Occupational History  . Not on file.   Social History Main Topics  . Smoking status: Former Smoker -- 0.50 packs/day for 15 years    Types: Cigarettes    Quit date: 12/07/2011  . Smokeless tobacco: Never Used  . Alcohol Use: 6.0 oz/week    10 Cans of beer per week     Comment: occasional to rare  . Drug Use: No  . Sexual Activity: Yes   Other Topics Concern  . Not on file   Social History Narrative    Current Outpatient Prescriptions on File Prior to Visit  Medication Sig Dispense Refill  . atorvastatin (LIPITOR) 40 MG  tablet Take 1 tablet (40 mg total) by mouth daily. 30 tablet 3  . canagliflozin (INVOKANA) 300 MG TABS tablet Take 300 mg by mouth daily. 90 tablet 2  . glucose blood (ONE TOUCH ULTRA TEST) test strip USE TO TEST BLOOD SUGAR TWICE DAILY    . Insulin Detemir (LEVEMIR FLEXTOUCH [place]) Inject 30 Units into the skin every morning.    . Olmesartan-Amlodipine-HCTZ (TRIBENZOR) 40-5-12.5 MG TABS Take 1 tablet by mouth daily. 30 tablet 11  . ONE TOUCH ULTRA TEST test strip USE TO TEST BLOOD SUGAR TWICE DAILY 100 each 11  . sildenafil (VIAGRA) 100 MG tablet Take 0.5-1 tablets (50-100 mg total) by mouth daily as needed for erectile dysfunction. 10 tablet 11  . sitaGLIPtin-metformin (JANUMET) 50-1000 MG per tablet Take 1 tablet by mouth 2 (two) times daily with a meal. 180 tablet 1  . albuterol (PROVENTIL HFA;VENTOLIN HFA) 108 (90 BASE) MCG/ACT inhaler Inhale 2 puffs into the lungs every 6 (six) hours as needed for wheezing or shortness of breath.     No current facility-administered medications on file prior to visit.    Allergies  Allergen Reactions  . Other Itching    Tree nuts     Family History  Problem Relation Age of Onset  . Diabetes Father   . Cancer  Neg Hx   . Heart disease Neg Hx   . Hyperlipidemia Neg Hx   . Hypertension Neg Hx   . Diabetes Mother   . Diabetes Brother     one brother  diabetic    BP 136/82 mmHg  Pulse 71  Temp(Src) 98.7 F (37.1 C) (Oral)  Ht 6\' 4"  (1.93 m)  Wt 253 lb (114.76 kg)  BMI 30.81 kg/m2  SpO2 98%   Review of Systems No recent weight change.  He denies hypoglycemia.    Objective:   Physical Exam VITAL SIGNS:  See vs page GENERAL: no distress Pulses: dorsalis pedis intact bilat.   MSK: no deformity of the feet CV: no leg edema Skin:  no ulcer on the feet.  normal color and temp on the feet. Neuro: sensation is intact to touch on the feet.    Lab Results  Component Value Date   HGBA1C 12.2* 07/07/2014      Assessment & Plan:    Noncompliance with cbg recording and insulin, persistent. DM: worse Obesity: persistent.  Patient is advised the following: Patient Instructions  check your blood sugar once a day.  vary the time of day when you check, between before the 3 meals, and at bedtime.  also check if you have symptoms of your blood sugar being too high or too low.  please keep a record of the readings and bring it to your next appointment here.  You can write it on any piece of paper.  please call us sooner if your blood sugar goes below 70, or if you have a lot of readings over 200.   Please come back for a follow-up appointment in 3 months.  Your blood sugar is really high. It is really important to take the insulin each day.  Take 30 units each day, no matter what your blood sugar is.   Please call next week, to tell us how the blood sugar is.   Please see a dietician specialist.  you will receive a phone call, about a day and time for an appointment

## 2014-10-07 ENCOUNTER — Ambulatory Visit: Payer: BLUE CROSS/BLUE SHIELD | Admitting: Endocrinology

## 2014-10-14 ENCOUNTER — Encounter: Payer: BLUE CROSS/BLUE SHIELD | Attending: Endocrinology | Admitting: Dietician

## 2014-10-14 VITALS — Wt 250.5 lb

## 2014-10-14 DIAGNOSIS — Z794 Long term (current) use of insulin: Secondary | ICD-10-CM | POA: Insufficient documentation

## 2014-10-14 DIAGNOSIS — Z713 Dietary counseling and surveillance: Secondary | ICD-10-CM | POA: Diagnosis not present

## 2014-10-14 DIAGNOSIS — E118 Type 2 diabetes mellitus with unspecified complications: Secondary | ICD-10-CM

## 2014-10-14 NOTE — Patient Instructions (Addendum)
*  Try to maintain weight for now... Priority #1 is controlling diabetes!  -Prepare your own breakfast at home  -Oatmeal with protein powder OR sausage links and whole wheat toast  -Keep healthy, high-protein snacks on hand at work and at home: beef jerky, chicken salad with low fat mayonnaise, PB2, protein shakes, yogurt, high-protein lunchables (P3)  -Get G2 Gatorade or Powerade Zero (avoid regular Gatorade!)  -Take your medications as directed

## 2014-10-14 NOTE — Progress Notes (Signed)
  Follow-up visit:  17 months Post-Operative RYGB Surgery  Medical Nutrition Therapy:  Appt start time: 330 end time: 400  Primary concerns today: Post-operative Bariatric Surgery Nutrition Management.   Mark Oconnor states that things are not going well regarding his health. He reports that he thinks his HgbA1c has increased due to his alcohol consumption. He drinks several beers a few times a week. Still working 2 jobs and going to school and does not have time to exercise. Noncompliant with medication regimen. He reports he does not want to take his insulin because he is afraid to gain weight. Cannot tolerate eggs or pizza. He feels like a "more compromising diet" will help him control his diabetes.   Surgery date: 05/26/2013  Surgery type: RYGB  [facility] lbs on 06/27/2012  Weight today: 250.5 lbs Weight change: 5 lbs gain  Total weight loss: 77 lbs  TANITA BODY COMP RESULTS   06/10/13  07/22/13 10/16/13 12/02/13 02/03/14  BMI (kg/m^2)  37.9  34.1 30 29.8 29.9  Fat Mass (lbs)  129.5  74.5 58 56 57.5  Fat Free Mass (lbs)  173.5  206 188.5 189 188  Total Body Water (lbs)  127.0  151 138 138.5 137.5     Preferred Learning Style:  No preference indicated   Learning Readiness:   Ready   24-hr recall: B (AM): chicken biscuit, hashbrown, diet pepsi Snk (AM): none L (PM): beef and broccoli from [employer] restaurant with diet pepsi Snk (PM): apples and caramel dip, popcorn, chips D (PM): chicken tenders and salad OR spaghetti Snk (PM):   Fluid intake: 3 liters of water per week, unsweet tea, diet pepsi, beer, regular Gatorade Estimated total protein intake: "getting closer to 80 grams"  Medications: see list  Supplementation: inconsistent  CBG monitoring: 1x a day Average CBG per patient: usually 240 mg/dL after work Last patient reported A1c: 12.2%  Using straws: no Drinking while eating: yes Hair loss: unknown Carbonated beverages: diet Pepsi and beer N/V/D/C:  vomiting with overeating Dumping syndrome: none  Recent physical activity: none  Progress Towards Goal(s):  In progress.    Nutritional Diagnosis:  [place]-3.3 Overweight/obesity related to past poor dietary habits and physical inactivity as evidenced by patient w/ recent RYGB surgery following dietary guidelines for continued weight loss.    Intervention:  Nutrition counseling provided. Reviewed the effects of different macronutrients on blood sugars and explained function of insulin. Demonstrated appropriate portion sizes of carbohydrates using food models and nutrition label. Worked with patient on creating new goals that will help him manage blood sugars.   Goals: *Try to maintain weight for now... Priority #1 is controlling diabetes! -Prepare your own breakfast at home  -Oatmeal with protein powder OR sausage links and whole wheat toast -Keep healthy, high-protein snacks on hand at work and at home: beef jerky, chicken salad with low fat mayonnaise, PB2, protein shakes, yogurt, high-protein lunchables (P3) -Get G2 Gatorade or Powerade Zero (avoid regular Gatorade!) -Take your medications as directed  Teaching Method Utilized: Visual Auditory Hands on  Barriers to learning/adherence to lifestyle change: food preferences, fear of weight gain  Demonstrated degree of understanding via:  Teach Back   Monitoring/Evaluation:  Dietary intake, exercise, and body weight. Patient to follow up in 6 weeks.

## 2014-10-15 ENCOUNTER — Encounter: Payer: Self-pay | Admitting: Dietician

## 2014-11-03 ENCOUNTER — Ambulatory Visit: Payer: BLUE CROSS/BLUE SHIELD | Admitting: Endocrinology

## 2014-11-17 ENCOUNTER — Ambulatory Visit: Payer: BLUE CROSS/BLUE SHIELD | Admitting: Endocrinology

## 2014-11-23 ENCOUNTER — Ambulatory Visit: Payer: BLUE CROSS/BLUE SHIELD | Admitting: Dietician

## 2014-12-14 ENCOUNTER — Ambulatory Visit: Payer: BLUE CROSS/BLUE SHIELD | Admitting: Endocrinology

## 2014-12-16 ENCOUNTER — Encounter: Payer: Self-pay | Admitting: Endocrinology

## 2014-12-16 ENCOUNTER — Ambulatory Visit (INDEPENDENT_AMBULATORY_CARE_PROVIDER_SITE_OTHER): Payer: BLUE CROSS/BLUE SHIELD | Admitting: Endocrinology

## 2014-12-16 VITALS — BP 128/82 | HR 77 | Temp 98.0°F | Ht 76.0 in | Wt 251.0 lb

## 2014-12-16 DIAGNOSIS — E118 Type 2 diabetes mellitus with unspecified complications: Secondary | ICD-10-CM

## 2014-12-16 LAB — HEMOGLOBIN A1C: Hgb A1c MFr Bld: 11.9 % — ABNORMAL HIGH (ref 4.6–6.5)

## 2014-12-16 MED ORDER — GLIMEPIRIDE 4 MG PO TABS: 4.0000 mg | ORAL_TABLET | Freq: Every day | ORAL | Status: DC

## 2014-12-16 NOTE — Progress Notes (Signed)
Subjective:    Patient ID: Mark Oconnor, male    DOB: 01/09/1981, 34 y.o.   MRN: [id number]  HPI Pt returns for f/u of diabetes mellitus: DM type: Insulin-requiring type 2 Dx'ed: 2011 Complications: none Therapy: insulin since dx DKA: never Severe hypoglycemia: never Pancreatitis: never Other: he had gastric bypass surgery in 2015--he has lost 82 lbs since then; he has chosen a simple qd insulin regimen. Interval history: He says he now misses the insulin approx 3 days per week.  He has urinary frequency.  no cbg record, but states cbg's vary from 60-210.  He says he is also working to reduce his EtOH consumption.  Past Medical History  Diagnosis Date  . Diabetes mellitus   . Hyperlipidemia   . Hypertension   . Morbid obesity   . GERD (gastroesophageal reflux disease)   . Sleep apnea 05-21-13    denies.Stop/Bang score =4 with PAT visit    Past Surgical History  Procedure Laterality Date  . Breath tek h pylori N/A 07/12/2012    Procedure: BREATH TEK H PYLORI;  Surgeon: Mariella Saa, MD;  Location: [facility];  Service: General;  Laterality: N/A;  . Gastric roux-en-y N/A 05/26/2013    Procedure: LAPAROSCOPIC ROUX-EN-Y GASTRIC BYPASS WITH UPPER ENDOSCOPY;  Surgeon: Mariella Saa, MD;  Location: [facility];  Service: General;  Laterality: N/A;    Social History   Social History  . Marital Status: Single    Spouse Name: N/A  . Number of Children: N/A  . Years of Education: N/A   Occupational History  . Not on file.   Social History Main Topics  . Smoking status: Former Smoker -- 0.50 packs/day for 15 years    Types: Cigarettes    Quit date: 12/07/2011  . Smokeless tobacco: Never Used  . Alcohol Use: 6.0 oz/week    10 Cans of beer per week     Comment: occasional to rare  . Drug Use: No  . Sexual Activity: Yes   Other Topics Concern  . Not on file   Social History Narrative    Current Outpatient Prescriptions on File Prior to Visit  Medication Sig  Dispense Refill  . atorvastatin (LIPITOR) 40 MG tablet Take 1 tablet (40 mg total) by mouth daily. 30 tablet 3  . canagliflozin (INVOKANA) 300 MG TABS tablet Take 300 mg by mouth daily. 90 tablet 2  . glucose blood (ONE TOUCH ULTRA TEST) test strip USE TO TEST BLOOD SUGAR TWICE DAILY    . Insulin Detemir (LEVEMIR FLEXTOUCH [place]) Inject 30 Units into the skin every morning.    . Olmesartan-Amlodipine-HCTZ (TRIBENZOR) 40-5-12.5 MG TABS Take 1 tablet by mouth daily. 30 tablet 11  . ONE TOUCH ULTRA TEST test strip USE TO TEST BLOOD SUGAR TWICE DAILY 100 each 11  . sildenafil (VIAGRA) 100 MG tablet Take 0.5-1 tablets (50-100 mg total) by mouth daily as needed for erectile dysfunction. 10 tablet 11  . sitaGLIPtin-metformin (JANUMET) 50-1000 MG per tablet Take 1 tablet by mouth 2 (two) times daily with a meal. 180 tablet 1  . albuterol (PROVENTIL HFA;VENTOLIN HFA) 108 (90 BASE) MCG/ACT inhaler Inhale 2 puffs into the lungs every 6 (six) hours as needed for wheezing or shortness of breath.     No current facility-administered medications on file prior to visit.    Allergies  Allergen Reactions  . Other Itching    Tree nuts     Family History  Problem Relation Age of Onset  .  Diabetes Father   . Cancer Neg Hx   . Heart disease Neg Hx   . Hyperlipidemia Neg Hx   . Hypertension Neg Hx   . Diabetes Mother   . Diabetes Brother     one brother  diabetic    BP 128/82 mmHg  Pulse 77  Temp(Src) 98 F (36.7 C) (Oral)  Ht  (1.93 m)  Wt 251 lb (113.853 kg)  BMI 30.57 kg/m2  SpO2 98%  Review of Systems He denies hypoglycemia    Objective:   Physical Exam VITAL SIGNS:  See vs page GENERAL: no distress Pulses: dorsalis pedis intact bilat.   MSK: no deformity of the feet CV: no leg edema Skin:  no ulcer on the feet.  normal color and temp on the feet. Neuro: sensation is intact to touch on the feet.    Lab Results  Component Value Date   HGBA1C 11.9* 12/16/2014      Assessment &  Plan:  DM: ongoing poor control Noncompliance with cbg recording and insulin: I'll work around this as best I can.  For now, we'll try to add another oral med, to try to de-emphasize the insulin.    Patient is advised the following: Patient Instructions  check your blood sugar once a day.  vary the time of day when you check, between before the 3 meals, and at bedtime.  also check if you have symptoms of your blood sugar being too high or too low.  please keep a record of the readings and bring it to your next appointment here.  You can write it on any piece of paper.  please call us sooner if your blood sugar goes below 70, or if you have a lot of readings over 200.   It is really important to take the insulin each day.  Please come back for a follow-up appointment in 3 months.    addendum: add "glimepiride."

## 2014-12-16 NOTE — Patient Instructions (Addendum)
check your blood sugar once a day.  vary the time of day when you check, between before the 3 meals, and at bedtime.  also check if you have symptoms of your blood sugar being too high or too low.  please keep a record of the readings and bring it to your next appointment here.  You can write it on any piece of paper.  please call us sooner if your blood sugar goes below 70, or if you have a lot of readings over 200.   It is really important to take the insulin each day.  Please come back for a follow-up appointment in 3 months.

## 2014-12-22 ENCOUNTER — Telehealth: Payer: Self-pay | Admitting: Endocrinology

## 2014-12-22 MED ORDER — CANAGLIFLOZIN 300 MG PO TABS: 300.0000 mg | ORAL_TABLET | Freq: Every day | ORAL | Status: DC

## 2014-12-22 NOTE — Telephone Encounter (Signed)
Rx sent and discount card placed upfront.

## 2014-12-22 NOTE — Telephone Encounter (Signed)
Pt needs invokana called into [facility] on [address]  Please have discount card ready for him for tomorrow for the invokana

## 2015-02-05 ENCOUNTER — Other Ambulatory Visit: Payer: Self-pay | Admitting: Internal Medicine

## 2015-03-12 ENCOUNTER — Telehealth (HOSPITAL_COMMUNITY): Payer: Self-pay

## 2015-03-12 NOTE — Telephone Encounter (Signed)
This patient is overdue for recommended follow-up with a [facility]ric [facility]. A mailing was sent to the patient on 01/01/15 along with a patient survey from both [facility] & [facility] with no response yet at this time.  Call attempted today to reestablish post-op care with [facility]. Patient advises that he called them recently to be seen due to pain & couldn't get in so he went to urgent care facility. Reports doing better now. I encouraged him to call back to get annual follow-up with Dr. Johna SheriffHoxworth or [place] & especially after recent urgent care trip & he agreed to call them. Notified [facility] of conversation so they can follow-up.   Jim LikeAmanda T. [facility] Coordinator [phone]

## 2015-03-17 ENCOUNTER — Ambulatory Visit (INDEPENDENT_AMBULATORY_CARE_PROVIDER_SITE_OTHER): Payer: BLUE CROSS/BLUE SHIELD | Admitting: Emergency Medicine

## 2015-03-17 VITALS — BP 119/80 | HR 59 | Temp 99.0°F | Resp 18 | Ht 75.0 in | Wt 243.3 lb

## 2015-03-17 DIAGNOSIS — S161XXA Strain of muscle, fascia and tendon at neck level, initial encounter: Secondary | ICD-10-CM

## 2015-03-17 DIAGNOSIS — J029 Acute pharyngitis, unspecified: Secondary | ICD-10-CM | POA: Diagnosis not present

## 2015-03-17 MED ORDER — NAPROXEN SODIUM 550 MG PO TABS: 550.0000 mg | ORAL_TABLET | Freq: Two times a day (BID) | ORAL | Status: DC

## 2015-03-17 MED ORDER — CYCLOBENZAPRINE HCL 5 MG PO TABS: 5.0000 mg | ORAL_TABLET | Freq: Three times a day (TID) | ORAL | Status: DC | PRN

## 2015-03-17 MED ORDER — PENICILLIN V POTASSIUM 500 MG PO TABS: 500.0000 mg | ORAL_TABLET | Freq: Four times a day (QID) | ORAL | Status: DC

## 2015-03-17 NOTE — Progress Notes (Signed)
Subjective:  Patient ID: Mark Oconnor, male    DOB: 1981/02/14  Age: 34 y.o. MRN: [id number]  CC: Sore Throat; Fever; Neck Pain; and Ear Pain   HPI Mark Oconnor presents   He has a number complaints that are relatively acute. He says he has neck pain radiating up the back of his head that first developed after he slept on the couch. He has not neurologic symptoms no numbness tingling or weakness in his arms and legs  Or radiation of pain.   He has nasal congestion postnasal drainage and nasal discharge. He said he feels feverish and has chills. He has been no cough wheezing or shortness of breath. He does have a sore throat. Worse with coughing. He has a history of nasal polyps. Said no improvement with over-the-counter medication  History Viraj has a past medical history of Diabetes mellitus; Hyperlipidemia; Hypertension; Morbid obesity (HCC); GERD (gastroesophageal reflux disease); Sleep apnea (05-21-13); and Allergy.   He has past surgical history that includes Breath tek h pylori (N/A, 07/12/2012) and Gastric Roux-En-Y (N/A, 05/26/2013).   His  family history includes Diabetes in his brother, father, and mother. There is no history of Cancer, Heart disease, Hyperlipidemia, or Hypertension.  He   reports that he quit smoking about 3 years ago. His smoking use included Cigarettes. He has a 7.5 pack-year smoking history. He has never used smokeless tobacco. He reports that he drinks about 6.0 oz of alcohol per week. He reports that he does not use illicit drugs.  Outpatient Prescriptions Prior to Visit  Medication Sig Dispense Refill  . atorvastatin (LIPITOR) 40 MG tablet Take 1 tablet (40 mg total) by mouth daily. 30 tablet 3  . canagliflozin (INVOKANA) 300 MG TABS tablet Take 300 mg by mouth daily. 90 tablet 2  . glimepiride (AMARYL) 4 MG tablet Take 1 tablet (4 mg total) by mouth daily before breakfast. 30 tablet 11  . glucose blood (ONE TOUCH ULTRA TEST) test strip USE TO  TEST BLOOD SUGAR TWICE DAILY    . LEVEMIR FLEXTOUCH 100 UNIT/ML Pen INJECT 30 UNITS INTO THE SKIN DAILY AT 10PM 15 mL 3  . Olmesartan-Amlodipine-HCTZ (TRIBENZOR) 40-5-12.5 MG TABS Take 1 tablet by mouth daily. 30 tablet 11  . ONE TOUCH ULTRA TEST test strip USE TO TEST BLOOD SUGAR TWICE DAILY 100 each 11  . sitaGLIPtin-metformin (JANUMET) 50-1000 MG per tablet Take 1 tablet by mouth 2 (two) times daily with a meal. 180 tablet 1  . VIAGRA 100 MG tablet TAKE 1/2-1 BY MOUTH DAILY AS NEEDED FOR ERECTILE DYSFUNCTION 10 tablet 0  . albuterol (PROVENTIL HFA;VENTOLIN HFA) 108 (90 BASE) MCG/ACT inhaler Inhale 2 puffs into the lungs every 6 (six) hours as needed for wheezing or shortness of breath.    . Insulin Detemir (LEVEMIR FLEXTOUCH [place]) Inject 30 Units into the skin every morning.     No facility-administered medications prior to visit.    Social History   Social History  . Marital Status: Single    Spouse Name: N/A  . Number of Children: N/A  . Years of Education: N/A   Social History Main Topics  . Smoking status: Former Smoker -- 0.50 packs/day for 15 years    Types: Cigarettes    Quit date: 12/07/2011  . Smokeless tobacco: Never Used  . Alcohol Use: 6.0 oz/week    10 Cans of beer per week     Comment: occasional to rare  . Drug Use: No  . Sexual  Activity: Yes   Other Topics Concern  . None   Social History Narrative     Review of Systems  Constitutional: Positive for chills and fatigue. Negative for fever and appetite change.  HENT: Positive for postnasal drip and sore throat. Negative for congestion, ear pain and sinus pressure.   Eyes: Negative for pain and redness.  Respiratory: Negative for cough, shortness of breath and wheezing.   Cardiovascular: Negative for leg swelling.  Gastrointestinal: Negative for nausea, vomiting, abdominal pain, diarrhea, constipation and blood in stool.  Endocrine: Negative for polyuria.  Genitourinary: Negative for dysuria, urgency,  frequency and flank pain.  Musculoskeletal: Positive for neck pain. Negative for gait problem.  Skin: Negative for rash.  Neurological: Negative for weakness and headaches.  Psychiatric/Behavioral: Negative for confusion and decreased concentration. The patient is not nervous/anxious.     Objective:  BP 119/80 mmHg  Pulse 59  Temp(Src) 99 F (37.2 C) (Oral)  Resp 18  Ht  (1.905 m)  Wt 243 lb 4.8 oz (110.36 kg)  BMI 30.41 kg/m2  SpO2 99%  Physical Exam  Constitutional: He is oriented to person, place, and time. He appears well-developed and well-nourished. No distress.  HENT:  Head: Normocephalic and atraumatic.  Right Ear: External ear normal.  Left Ear: External ear normal.  Nose: Nose normal.  Eyes: Conjunctivae and EOM are normal. Pupils are equal, round, and reactive to light. No scleral icterus.  Neck: Normal range of motion. Neck supple. No tracheal deviation present.  Cardiovascular: Normal rate, regular rhythm and normal heart sounds.   Pulmonary/Chest: Effort normal. No respiratory distress. He has no wheezes. He has no rales.  Abdominal: He exhibits no mass. There is no tenderness. There is no rebound and no guarding.  Musculoskeletal: He exhibits no edema.       Cervical back: He exhibits tenderness.  Lymphadenopathy:    He has no cervical adenopathy.  Neurological: He is alert and oriented to person, place, and time. Coordination normal.  Skin: Skin is warm and dry. No rash noted.  Psychiatric: He has a normal mood and affect. His behavior is normal.      Assessment & Plan:   Mark Oconnor was seen today for sore throat, fever, neck pain and ear pain.  Diagnoses and all orders for this visit:  Acute pharyngitis, unspecified etiology  Cervical strain, initial encounter  Other orders -     penicillin v potassium (VEETID) 500 MG tablet; Take 1 tablet (500 mg total) by mouth 4 (four) times daily. -     naproxen sodium (ANAPROX DS) 550 MG tablet; Take 1  tablet (550 mg total) by mouth 2 (two) times daily with a meal. -     cyclobenzaprine (FLEXERIL) 5 MG tablet; Take 1 tablet (5 mg total) by mouth 3 (three) times daily as needed for muscle spasms.  I am having Mark Oconnor start on penicillin v potassium, naproxen sodium, and cyclobenzaprine. I am also having him maintain his albuterol, glucose blood, atorvastatin, ONE TOUCH ULTRA TEST, Olmesartan-Amlodipine-HCTZ, Insulin Detemir (LEVEMIR FLEXTOUCH [place]), sitaGLIPtin-metformin, glimepiride, canagliflozin, VIAGRA, and LEVEMIR FLEXTOUCH.  Meds ordered this encounter  Medications  . penicillin v potassium (VEETID) 500 MG tablet    Sig: Take 1 tablet (500 mg total) by mouth 4 (four) times daily.    Dispense:  40 tablet    Refill:  0  . naproxen sodium (ANAPROX DS) 550 MG tablet    Sig: Take 1 tablet (550 mg total) by mouth 2 (two) times  daily with a meal.    Dispense:  40 tablet    Refill:  0  . cyclobenzaprine (FLEXERIL) 5 MG tablet    Sig: Take 1 tablet (5 mg total) by mouth 3 (three) times daily as needed for muscle spasms.    Dispense:  30 tablet    Refill:  0    Appropriate red flag conditions were discussed with the patient as well as actions that should be taken.  Patient expressed his understanding.  Follow-up: Return if symptoms worsen or fail to improve.  Carmelina DaneAnderson, Oluwadarasimi Favor S, MD

## 2015-03-17 NOTE — Patient Instructions (Signed)
Cervical Sprain  A cervical sprain is an injury in the neck in which the strong, fibrous tissues (ligaments) that connect your neck bones stretch or tear. Cervical sprains can range from mild to severe. Severe cervical sprains can cause the neck vertebrae to be unstable. This can lead to damage of the spinal cord and can result in serious nervous system problems. The amount of time it takes for a cervical sprain to get better depends on the cause and extent of the injury. Most cervical sprains heal in 1 to 3 weeks.  CAUSES   Severe cervical sprains may be caused by:    Contact sport injuries (such as from football, rugby, wrestling, hockey, auto racing, gymnastics, diving, martial arts, or boxing).    Motor vehicle collisions.    Whiplash injuries. This is an injury from a sudden forward and backward whipping movement of the head and neck.   Falls.   Mild cervical sprains may be caused by:    Being in an awkward position, such as while cradling a telephone between your ear and shoulder.    Sitting in a chair that does not offer proper support.    Working at a poorly designed computer station.    Looking up or down for long periods of time.   SYMPTOMS    Pain, soreness, stiffness, or a burning sensation in the front, back, or sides of the neck. This discomfort may develop immediately after the injury or slowly, 24 hours or more after the injury.    Pain or tenderness directly in the middle of the back of the neck.    Shoulder or upper back pain.    Limited ability to move the neck.    Headache.    Dizziness.    Weakness, numbness, or tingling in the hands or arms.    Muscle spasms.    Difficulty swallowing or chewing.    Tenderness and swelling of the neck.   DIAGNOSIS   Most of the time your health care provider can diagnose a cervical sprain by taking your history and doing a physical exam. Your health care provider will ask about previous neck injuries and any known neck  problems, such as arthritis in the neck. X-rays may be taken to find out if there are any other problems, such as with the bones of the neck. Other tests, such as a CT scan or MRI, may also be needed.   TREATMENT   Treatment depends on the severity of the cervical sprain. Mild sprains can be treated with rest, keeping the neck in place (immobilization), and pain medicines. Severe cervical sprains are immediately immobilized. Further treatment is done to help with pain, muscle spasms, and other symptoms and may include:   Medicines, such as pain relievers, numbing medicines, or muscle relaxants.    Physical therapy. This may involve stretching exercises, strengthening exercises, and posture training. Exercises and improved posture can help stabilize the neck, strengthen muscles, and help stop symptoms from returning.   HOME CARE INSTRUCTIONS    Put ice on the injured area.     Put ice in a plastic bag.     Place a towel between your skin and the bag.     Leave the ice on for 15-20 minutes, 3-4 times a day.    If your injury was severe, you may have been given a cervical collar to wear. A cervical collar is a two-piece collar designed to keep your neck from moving while it heals.      Do not remove the collar unless instructed by your health care provider.    If you have long hair, keep it outside of the collar.    Ask your health care provider before making any adjustments to your collar. Minor adjustments may be required over time to improve comfort and reduce pressure on your chin or on the back of your head.    Ifyou are allowed to remove the collar for cleaning or bathing, follow your health care provider's instructions on how to do so safely.    Keep your collar clean by wiping it with mild soap and water and drying it completely. If the collar you have been given includes removable pads, remove them every 1-2 days and hand wash them with soap and water. Allow them to air dry. They should be completely  dry before you wear them in the collar.    If you are allowed to remove the collar for cleaning and bathing, wash and dry the skin of your neck. Check your skin for irritation or sores. If you see any, tell your health care provider.    Do not drive while wearing the collar.    Only take over-the-counter or prescription medicines for pain, discomfort, or fever as directed by your health care provider.    Keep all follow-up appointments as directed by your health care provider.    Keep all physical therapy appointments as directed by your health care provider.    Make any needed adjustments to your workstation to promote good posture.    Avoid positions and activities that make your symptoms worse.    Warm up and stretch before being active to help prevent problems.   SEEK MEDICAL CARE IF:    Your pain is not controlled with medicine.    You are unable to decrease your pain medicine over time as planned.    Your activity level is not improving as expected.   SEEK IMMEDIATE MEDICAL CARE IF:    You develop any bleeding.   You develop stomach upset.   You have signs of an allergic reaction to your medicine.    Your symptoms get worse.    You develop new, unexplained symptoms.    You have numbness, tingling, weakness, or paralysis in any part of your body.   MAKE SURE YOU:    Understand these instructions.   Will watch your condition.   Will get help right away if you are not doing well or get worse.     This information is not intended to replace advice given to you by your health care provider. Make sure you discuss any questions you have with your health care provider.     Document Released: 02/19/2007 Document Revised: 04/29/2013 Document Reviewed: 10/30/2012  Elsevier Interactive Patient Education 2016 [employer]

## 2015-03-19 ENCOUNTER — Telehealth: Payer: Self-pay | Admitting: Endocrinology

## 2015-03-19 ENCOUNTER — Ambulatory Visit: Payer: BLUE CROSS/BLUE SHIELD | Admitting: Endocrinology

## 2015-03-19 NOTE — Telephone Encounter (Signed)
Patient no showed today's appt. Please advise on how to follow up. °A. No follow up necessary. °B. Follow up urgent. Contact patient immediately. °C. Follow up necessary. Contact patient and schedule visit in ___ days. °D. Follow up advised. Contact patient and schedule visit in ____weeks. ° °

## 2015-03-20 NOTE — Telephone Encounter (Signed)
Follow up advised. Contact patient and schedule visit in 2-3 months

## 2015-03-22 NOTE — Telephone Encounter (Signed)
No show letter mailed.

## 2015-04-08 ENCOUNTER — Telehealth: Payer: Self-pay | Admitting: Internal Medicine

## 2015-04-08 MED ORDER — SILDENAFIL CITRATE 100 MG PO TABS: ORAL_TABLET | ORAL | Status: DC

## 2015-04-08 NOTE — Telephone Encounter (Signed)
done

## 2015-04-08 NOTE — Telephone Encounter (Signed)
Pt requesting refill for VIAGRA 100 MG tablet [[id number] Pharmacy [facility] [address] [facility] [place]

## 2015-04-19 ENCOUNTER — Encounter: Payer: Self-pay | Admitting: Endocrinology

## 2015-04-19 ENCOUNTER — Ambulatory Visit (INDEPENDENT_AMBULATORY_CARE_PROVIDER_SITE_OTHER): Payer: BLUE CROSS/BLUE SHIELD | Admitting: Endocrinology

## 2015-04-19 VITALS — BP 134/93 | HR 59 | Temp 98.3°F | Ht 75.0 in | Wt 244.0 lb

## 2015-04-19 DIAGNOSIS — E118 Type 2 diabetes mellitus with unspecified complications: Secondary | ICD-10-CM

## 2015-04-19 LAB — POCT GLYCOSYLATED HEMOGLOBIN (HGB A1C): HEMOGLOBIN A1C: 13.2

## 2015-04-19 NOTE — Patient Instructions (Addendum)
It is really important to minimize alcohol intake.   check your blood sugar once a day.  vary the time of day when you check, between before the 3 meals, and at bedtime.  also check if you have symptoms of your blood sugar being too high or too low.  please keep a record of the readings and bring it to your next appointment here.  You can write it on any piece of paper.  please call us sooner if your blood sugar goes below 70, or if you have a lot of readings over 200.   For your safety, it is really important to take the insulin each day, and to take just the prescribed amount.  To help you remember it, try putting it next to something you use each morning.  Please come back for a follow-up appointment in 3 months.

## 2015-04-19 NOTE — Progress Notes (Signed)
Subjective:    Patient ID: Mark Oconnor, male    DOB: 04/25/1981, 34 y.o.   MRN: [id number]  HPI Pt returns for f/u of diabetes mellitus: DM type: Insulin-requiring type 2 Dx'ed: 2011 Complications: none Therapy: insulin since dx DKA: never Severe hypoglycemia: never Pancreatitis: never Other: he had gastric bypass surgery in 2015--he has lost 82 lbs since then; he has chosen a simple qd insulin regimen.   Interval history: He says he now misses the insulin approx 3 days per week.  He has urinary frequency.  no cbg record, but states cbg's vary from 60-210.  He drinks 2-3 beers per day.  Last week, he had a episode of hypoglycemia, in the late afternoon.  This happened after he missed several days of insulin, then took extra to make up for it.   Past Medical History  Diagnosis Date  . Diabetes mellitus   . Hyperlipidemia   . Hypertension   . Morbid obesity (HCC)   . GERD (gastroesophageal reflux disease)   . Sleep apnea 05-21-13    denies.Stop/Bang score =4 with PAT visit  . Allergy     Past Surgical History  Procedure Laterality Date  . Breath tek h pylori N/A 07/12/2012    Procedure: BREATH TEK H PYLORI;  Surgeon: Mariella SaaBenjamin T Hoxworth, MD;  [facility];  Service: General;  Laterality: N/A;  . Gastric roux-en-y N/A 05/26/2013    Procedure: LAPAROSCOPIC ROUX-EN-Y GASTRIC BYPASS WITH UPPER ENDOSCOPY;  Surgeon: Mariella SaaBenjamin T Hoxworth, MD;  Location: [facility];  Service: General;  Laterality: N/A;    Social History   Social History  . Marital Status: Single    Spouse Name: N/A  . Number of Children: N/A  . Years of Education: N/A   Occupational History  . Not on file.   Social History Main Topics  . Smoking status: Former Smoker -- 0.50 packs/day for 15 years    Types: Cigarettes    Quit date: 12/07/2011  . Smokeless tobacco: Never Used  . Alcohol Use: 6.0 oz/week    10 Cans of beer per week     Comment: occasional to rare  . Drug Use: No  . Sexual Activity:  Yes   Other Topics Concern  . Not on file   Social History Narrative    Current Outpatient Prescriptions on File Prior to Visit  Medication Sig Dispense Refill  . canagliflozin (INVOKANA) 300 MG TABS tablet Take 300 mg by mouth daily. 90 tablet 2  . cyclobenzaprine (FLEXERIL) 5 MG tablet Take 1 tablet (5 mg total) by mouth 3 (three) times daily as needed for muscle spasms. 30 tablet 0  . glucose blood (ONE TOUCH ULTRA TEST) test strip USE TO TEST BLOOD SUGAR TWICE DAILY    . LEVEMIR FLEXTOUCH 100 UNIT/ML Pen INJECT 30 UNITS INTO THE SKIN DAILY AT 10PM 15 mL 3  . naproxen sodium (ANAPROX DS) 550 MG tablet Take 1 tablet (550 mg total) by mouth 2 (two) times daily with a meal. 40 tablet 0  . Olmesartan-Amlodipine-HCTZ (TRIBENZOR) 40-5-12.5 MG TABS Take 1 tablet by mouth daily. 30 tablet 11  . ONE TOUCH ULTRA TEST test strip USE TO TEST BLOOD SUGAR TWICE DAILY 100 each 11  . sildenafil (VIAGRA) 100 MG tablet TAKE 1/2-1 BY MOUTH DAILY AS NEEDED FOR ERECTILE DYSFUNCTION 10 tablet 0  . sitaGLIPtin-metformin (JANUMET) 50-1000 MG per tablet Take 1 tablet by mouth 2 (two) times daily with a meal. 180 tablet 1  . albuterol (PROVENTIL  HFA;VENTOLIN HFA) 108 (90 BASE) MCG/ACT inhaler Inhale 2 puffs into the lungs every 6 (six) hours as needed for wheezing or shortness of breath.    Marland Kitchen atorvastatin (LIPITOR) 40 MG tablet Take 1 tablet (40 mg total) by mouth daily. (Patient not taking: Reported on 04/19/2015) 30 tablet 3  . glimepiride (AMARYL) 4 MG tablet Take 1 tablet (4 mg total) by mouth daily before breakfast. (Patient not taking: Reported on 04/19/2015) 30 tablet 11  . penicillin v potassium (VEETID) 500 MG tablet Take 1 tablet (500 mg total) by mouth 4 (four) times daily. (Patient not taking: Reported on 04/19/2015) 40 tablet 0   No current facility-administered medications on file prior to visit.    Allergies  Allergen Reactions  . Other Itching    Tree nuts     Family History  Problem  Relation Age of Onset  . Diabetes Father   . Cancer Neg Hx   . Heart disease Neg Hx   . Hyperlipidemia Neg Hx   . Hypertension Neg Hx   . Diabetes Mother   . Diabetes Brother     one brother  diabetic    BP 134/93 mmHg  Pulse 59  Temp(Src) 98.3 F (36.8 C) (Oral)  Ht  (1.905 m)  Wt 244 lb (110.678 kg)  BMI 30.50 kg/m2  SpO2 98%  Review of Systems He denies LOC    Objective:   Physical Exam VITAL SIGNS:  See vs page GENERAL: no distress Pulses: dorsalis pedis intact bilat.   MSK: no deformity of the feet CV: no leg edema Skin:  no ulcer on the feet.  normal color and temp on the feet. Neuro: sensation is intact to touch on the feet   A1c=13.2%    Assessment & Plan:  DM: severe exacerbation.  Noncompliance with cbg recording and insulin.    Alcohol intake: this also complicates the rx of DM.     Patient is advised the following: Patient Instructions  It is really important to minimize alcohol intake.   check your blood sugar once a day.  vary the time of day when you check, between before the 3 meals, and at bedtime.  also check if you have symptoms of your blood sugar being too high or too low.  please keep a record of the readings and bring it to your next appointment here.  You can write it on any piece of paper.  please call us sooner if your blood sugar goes below 70, or if you have a lot of readings over 200.   For your safety, it is really important to take the insulin each day, and to take just the prescribed amount.  To help you remember it, try putting it next to something you use each morning.  Please come back for a follow-up appointment in 3 months.

## 2015-05-28 ENCOUNTER — Other Ambulatory Visit: Payer: Self-pay | Admitting: Internal Medicine

## 2015-06-16 ENCOUNTER — Telehealth: Payer: Self-pay

## 2015-06-16 MED ORDER — INSULIN PEN NEEDLE 31G X 5 MM MISC
1.0000 | Freq: Every day | Status: DC
Start: 2015-06-16 — End: 2016-06-14

## 2015-06-16 NOTE — Telephone Encounter (Signed)
Pt called requesting Rx for pen needles. Rx sent to verified pharmacy. Pt advised

## 2015-07-19 ENCOUNTER — Ambulatory Visit: Payer: BLUE CROSS/BLUE SHIELD | Admitting: Endocrinology

## 2015-08-03 ENCOUNTER — Ambulatory Visit (INDEPENDENT_AMBULATORY_CARE_PROVIDER_SITE_OTHER): Payer: BLUE CROSS/BLUE SHIELD | Admitting: Endocrinology

## 2015-08-03 ENCOUNTER — Encounter: Payer: Self-pay | Admitting: Endocrinology

## 2015-08-03 VITALS — BP 112/74 | HR 70 | Temp 98.1°F | Ht 75.0 in | Wt 253.0 lb

## 2015-08-03 DIAGNOSIS — E119 Type 2 diabetes mellitus without complications: Secondary | ICD-10-CM

## 2015-08-03 DIAGNOSIS — E118 Type 2 diabetes mellitus with unspecified complications: Secondary | ICD-10-CM

## 2015-08-03 DIAGNOSIS — Z794 Long term (current) use of insulin: Secondary | ICD-10-CM

## 2015-08-03 LAB — POCT GLYCOSYLATED HEMOGLOBIN (HGB A1C): HEMOGLOBIN A1C: 12.6

## 2015-08-03 MED ORDER — INSULIN DETEMIR 100 UNIT/ML FLEXPEN: PEN_INJECTOR | SUBCUTANEOUS | Status: DC

## 2015-08-03 NOTE — Progress Notes (Signed)
Subjective:    Patient ID: Mark Oconnor, male    DOB: [birth year], 35 y.o.   MRN: [id number]  HPI Pt returns for f/u of diabetes mellitus: DM type: Insulin-requiring type 2 Dx'ed: 2011 Complications: none Therapy: insulin since dx DKA: never Severe hypoglycemia: never Pancreatitis: never Other: he had gastric bypass surgery in 2015--he has lost 82 lbs since then; he has chosen a simple qd insulin regimen.   Interval history: Pt says he continues to struggle with excessive alcohol intake and frequently missing insulin doses.  pt states he feels well in general.   Past Medical History  Diagnosis Date  . Diabetes mellitus   . Hyperlipidemia   . Hypertension   . Morbid obesity (HCC)   . GERD (gastroesophageal reflux disease)   . Sleep apnea 05-21-13    denies.Stop/Bang score =4 with PAT visit  . Allergy     Past Surgical History  Procedure Laterality Date  . Breath tek h pylori N/A 07/12/2012    Procedure: BREATH TEK H PYLORI;  Surgeon: Mariella Saa, MD;  Location: [facility];  Service: General;  Laterality: N/A;  . Gastric roux-en-y N/A 05/26/2013    Procedure: LAPAROSCOPIC ROUX-EN-Y GASTRIC BYPASS WITH UPPER ENDOSCOPY;  Surgeon: Mariella Saa, MD;  Location: [facility];  Service: General;  Laterality: N/A;    Social History   Social History  . Marital Status: Single    Spouse Name: N/A  . Number of Children: N/A  . Years of Education: N/A   Occupational History  . Not on file.   Social History Main Topics  . Smoking status: Former Smoker -- 0.50 packs/day for 15 years    Types: Cigarettes    Quit date: 12/07/2011  . Smokeless tobacco: Never Used  . Alcohol Use: 6.0 oz/week    10 Cans of beer per week     Comment: occasional to rare  . Drug Use: No  . Sexual Activity: Yes   Other Topics Concern  . Not on file   Social History Narrative    Current Outpatient Prescriptions on File Prior to Visit  Medication Sig Dispense Refill  . atorvastatin  (LIPITOR) 40 MG tablet Take 1 tablet (40 mg total) by mouth daily. 30 tablet 3  . canagliflozin (INVOKANA) 300 MG TABS tablet Take 300 mg by mouth daily. 90 tablet 2  . glimepiride (AMARYL) 4 MG tablet Take 1 tablet (4 mg total) by mouth daily before breakfast. 30 tablet 11  . glucose blood (ONE TOUCH ULTRA TEST) test strip USE TO TEST BLOOD SUGAR TWICE DAILY    . Insulin Pen Needle 31G X 5 MM MISC 1 each by Does not apply route daily. Dx Code E11.8 100 each 1  . Olmesartan-Amlodipine-HCTZ 40-5-12.5 MG TABS TAKE 1 TABLET BY MOUTH EVERY DAY 30 tablet 5  . ONE TOUCH ULTRA TEST test strip USE TO TEST BLOOD SUGAR TWICE DAILY 100 each 11  . sitaGLIPtin-metformin (JANUMET) 50-1000 MG per tablet Take 1 tablet by mouth 2 (two) times daily with a meal. 180 tablet 1  . albuterol (PROVENTIL HFA;VENTOLIN HFA) 108 (90 BASE) MCG/ACT inhaler Inhale 2 puffs into the lungs every 6 (six) hours as needed for wheezing or shortness of breath.     No current facility-administered medications on file prior to visit.    Allergies  Allergen Reactions  . Other Itching    Tree nuts     Family History  Problem Relation Age of Onset  . Diabetes Father   .  Cancer Neg Hx   . Heart disease Neg Hx   . Hyperlipidemia Neg Hx   . Hypertension Neg Hx   . Diabetes Mother   . Diabetes Brother     one brother  diabetic    BP 112/74 mmHg  Pulse 70  Temp(Src) 98.1 F (36.7 C) (Oral)  Ht 6\' 3"  (1.905 m)  Wt 253 lb (114.76 kg)  BMI 31.62 kg/m2  SpO2 97%  Review of Systems He denies hypoglycemia.      Objective:   Physical Exam VITAL SIGNS:  See vs page GENERAL: no distress Pulses: dorsalis pedis intact bilat.   MSK: no deformity of the feet.  CV: no leg edema.   Skin:  no ulcer on the feet.  normal color and temp on the feet. Neuro: sensation is intact to touch on the feet.     A1c=12.6%    Assessment & Plan:  DM: ongoing poor control.  Noncompliance with cbg recording and insulin, persistent.    Chronic alcoholism, persistent: this is compromising the care of his DM.   Morbid obesity: improved with surgery, but improvement has leveled off.    Patient is advised the following: Patient Instructions  It is really important to minimize alcohol intake.   check your blood sugar once a day.  vary the time of day when you check, between before the 3 meals, and at bedtime.  also check if you have symptoms of your blood sugar being too high or too low.  please keep a record of the readings and bring it to your next appointment here.  You can write it on any piece of paper.  please call us sooner if your blood sugar goes below 70, or if you have a lot of readings over 200.   To help you remember it, try putting it next to something you use each morning.   Please come back for a follow-up appointment in 4 months.

## 2015-08-03 NOTE — Patient Instructions (Addendum)
It is really important to minimize alcohol intake.   check your blood sugar once a day.  vary the time of day when you check, between before the 3 meals, and at bedtime.  also check if you have symptoms of your blood sugar being too high or too low.  please keep a record of the readings and bring it to your next appointment here.  You can write it on any piece of paper.  please call us sooner if your blood sugar goes below 70, or if you have a lot of readings over 200.   To help you remember it, try putting it next to something you use each morning.   Please come back for a follow-up appointment in 4 months.

## 2015-08-05 ENCOUNTER — Other Ambulatory Visit: Payer: Self-pay | Admitting: Internal Medicine

## 2015-08-05 ENCOUNTER — Telehealth: Payer: Self-pay | Admitting: Endocrinology

## 2015-08-05 DIAGNOSIS — E118 Type 2 diabetes mellitus with unspecified complications: Secondary | ICD-10-CM | POA: Insufficient documentation

## 2015-08-05 DIAGNOSIS — Z794 Long term (current) use of insulin: Secondary | ICD-10-CM | POA: Insufficient documentation

## 2015-08-05 NOTE — Telephone Encounter (Signed)
Patient stated the was under the assumption that his medication for the invokana was free with the discount card, he was charged 25.00, please advise

## 2015-08-05 NOTE — Telephone Encounter (Signed)
Pt called to check up on this request. Pt was wondering if he can get it today bc he is leaving town tomorrow morning.

## 2015-08-06 NOTE — Telephone Encounter (Signed)
I contacted the pt and advised last years discount card was to decrease the co-payment to 25$. Pt was advised we have a new copayment card that eliminates the co-payment. Pt will come by later today and pick the discount card up from our office.

## 2015-08-17 DIAGNOSIS — E08649 Diabetes mellitus due to underlying condition with hypoglycemia without coma: Secondary | ICD-10-CM | POA: Diagnosis not present

## 2015-08-17 DIAGNOSIS — R7309 Other abnormal glucose: Secondary | ICD-10-CM | POA: Diagnosis not present

## 2015-08-17 DIAGNOSIS — H538 Other visual disturbances: Secondary | ICD-10-CM | POA: Diagnosis not present

## 2015-09-21 LAB — HM DIABETES EYE EXAM

## 2015-09-27 DIAGNOSIS — R7309 Other abnormal glucose: Secondary | ICD-10-CM | POA: Diagnosis not present

## 2015-09-27 DIAGNOSIS — H538 Other visual disturbances: Secondary | ICD-10-CM | POA: Diagnosis not present

## 2015-09-27 DIAGNOSIS — E08649 Diabetes mellitus due to underlying condition with hypoglycemia without coma: Secondary | ICD-10-CM | POA: Diagnosis not present

## 2015-12-03 ENCOUNTER — Ambulatory Visit: Payer: BLUE CROSS/BLUE SHIELD | Admitting: Endocrinology

## 2015-12-03 DIAGNOSIS — Z0289 Encounter for other administrative examinations: Secondary | ICD-10-CM

## 2016-01-07 ENCOUNTER — Other Ambulatory Visit: Payer: Self-pay | Admitting: Internal Medicine

## 2016-01-07 ENCOUNTER — Other Ambulatory Visit: Payer: Self-pay | Admitting: Endocrinology

## 2016-01-07 NOTE — Telephone Encounter (Signed)
Please refill x 1 Ov is due  

## 2016-01-17 DIAGNOSIS — L03119 Cellulitis of unspecified part of limb: Secondary | ICD-10-CM | POA: Diagnosis not present

## 2016-01-17 DIAGNOSIS — L02419 Cutaneous abscess of limb, unspecified: Secondary | ICD-10-CM | POA: Diagnosis not present

## 2016-04-09 ENCOUNTER — Other Ambulatory Visit: Payer: Self-pay | Admitting: Endocrinology

## 2016-04-09 NOTE — Telephone Encounter (Signed)
Please refill x 3 mos Ov is due 

## 2016-04-13 ENCOUNTER — Other Ambulatory Visit: Payer: Self-pay | Admitting: Internal Medicine

## 2016-04-17 NOTE — Telephone Encounter (Signed)
LVM for pt to call back as soon as possible.   RE: Pt needs an appt.

## 2016-04-18 ENCOUNTER — Other Ambulatory Visit: Payer: Self-pay | Admitting: Internal Medicine

## 2016-04-18 DIAGNOSIS — I10 Essential (primary) hypertension: Secondary | ICD-10-CM

## 2016-04-18 DIAGNOSIS — Z794 Long term (current) use of insulin: Secondary | ICD-10-CM

## 2016-04-18 DIAGNOSIS — E118 Type 2 diabetes mellitus with unspecified complications: Secondary | ICD-10-CM

## 2016-04-18 MED ORDER — OLMESARTAN-AMLODIPINE-HCTZ 40-5-12.5 MG PO TABS: 1.0000 | ORAL_TABLET | Freq: Every day | ORAL | 0 refills | Status: DC

## 2016-04-24 ENCOUNTER — Ambulatory Visit: Payer: BLUE CROSS/BLUE SHIELD | Admitting: Internal Medicine

## 2016-05-11 ENCOUNTER — Ambulatory Visit: Payer: BLUE CROSS/BLUE SHIELD | Admitting: Internal Medicine

## 2016-05-17 ENCOUNTER — Ambulatory Visit: Payer: BLUE CROSS/BLUE SHIELD | Admitting: Internal Medicine

## 2016-05-23 ENCOUNTER — Ambulatory Visit: Payer: BLUE CROSS/BLUE SHIELD | Admitting: Internal Medicine

## 2016-06-06 ENCOUNTER — Ambulatory Visit: Payer: BLUE CROSS/BLUE SHIELD | Admitting: Internal Medicine

## 2016-06-08 ENCOUNTER — Other Ambulatory Visit (INDEPENDENT_AMBULATORY_CARE_PROVIDER_SITE_OTHER): Payer: BLUE CROSS/BLUE SHIELD

## 2016-06-08 ENCOUNTER — Ambulatory Visit (INDEPENDENT_AMBULATORY_CARE_PROVIDER_SITE_OTHER): Payer: BLUE CROSS/BLUE SHIELD | Admitting: Internal Medicine

## 2016-06-08 ENCOUNTER — Encounter: Payer: Self-pay | Admitting: Internal Medicine

## 2016-06-08 VITALS — BP 124/88 | HR 68 | Temp 98.4°F | Ht 75.0 in | Wt 259.0 lb

## 2016-06-08 DIAGNOSIS — N5201 Erectile dysfunction due to arterial insufficiency: Secondary | ICD-10-CM | POA: Diagnosis not present

## 2016-06-08 DIAGNOSIS — Z794 Long term (current) use of insulin: Secondary | ICD-10-CM

## 2016-06-08 DIAGNOSIS — Z9884 Bariatric surgery status: Secondary | ICD-10-CM

## 2016-06-08 DIAGNOSIS — K219 Gastro-esophageal reflux disease without esophagitis: Secondary | ICD-10-CM | POA: Diagnosis not present

## 2016-06-08 DIAGNOSIS — E118 Type 2 diabetes mellitus with unspecified complications: Secondary | ICD-10-CM

## 2016-06-08 DIAGNOSIS — E785 Hyperlipidemia, unspecified: Secondary | ICD-10-CM

## 2016-06-08 DIAGNOSIS — Z0000 Encounter for general adult medical examination without abnormal findings: Secondary | ICD-10-CM | POA: Diagnosis not present

## 2016-06-08 DIAGNOSIS — E538 Deficiency of other specified B group vitamins: Secondary | ICD-10-CM

## 2016-06-08 DIAGNOSIS — Z0001 Encounter for general adult medical examination with abnormal findings: Secondary | ICD-10-CM

## 2016-06-08 DIAGNOSIS — E559 Vitamin D deficiency, unspecified: Secondary | ICD-10-CM

## 2016-06-08 DIAGNOSIS — E781 Pure hyperglyceridemia: Secondary | ICD-10-CM | POA: Diagnosis not present

## 2016-06-08 DIAGNOSIS — I10 Essential (primary) hypertension: Secondary | ICD-10-CM

## 2016-06-08 LAB — URINALYSIS, ROUTINE W REFLEX MICROSCOPIC
Bilirubin Urine: NEGATIVE
Hgb urine dipstick: NEGATIVE
Ketones, ur: 15 — AB
Leukocytes, UA: NEGATIVE
Nitrite: NEGATIVE
Specific Gravity, Urine: 1.01 (ref 1.000–1.030)
Total Protein, Urine: NEGATIVE
Urine Glucose: >=1000 — AB
Urobilinogen, UA: 0.2 (ref 0.0–1.0)
pH: 6 (ref 5.0–8.0)

## 2016-06-08 LAB — CBC WITH DIFFERENTIAL/PLATELET
BASOS ABS: 0 10*3/uL (ref 0.0–0.1)
Basophils Relative: 0.6 % (ref 0.0–3.0)
EOS ABS: 0.1 10*3/uL (ref 0.0–0.7)
Eosinophils Relative: 1 % (ref 0.0–5.0)
HEMATOCRIT: 43.1 % (ref 39.0–52.0)
HEMOGLOBIN: 14.2 g/dL (ref 13.0–17.0)
LYMPHS PCT: 36.9 % (ref 12.0–46.0)
Lymphs Abs: 2 10*3/uL (ref 0.7–4.0)
MCHC: 32.9 g/dL (ref 30.0–36.0)
MCV: 78 fl (ref 78.0–100.0)
Monocytes Absolute: 0.3 10*3/uL (ref 0.1–1.0)
Monocytes Relative: 6.3 % (ref 3.0–12.0)
Neutro Abs: 3 10*3/uL (ref 1.4–7.7)
Neutrophils Relative %: 55.2 % (ref 43.0–77.0)
Platelets: 209 10*3/uL (ref 150.0–400.0)
RBC: 5.53 Mil/uL (ref 4.22–5.81)
RDW: 13.4 % (ref 11.5–15.5)
WBC: 5.4 10*3/uL (ref 4.0–10.5)

## 2016-06-08 LAB — MICROALBUMIN / CREATININE URINE RATIO
Creatinine,U: 57.3 mg/dL
Microalb Creat Ratio: 1.2 mg/g (ref 0.0–30.0)

## 2016-06-08 LAB — HEMOGLOBIN A1C: Hgb A1c MFr Bld: 14.2 % — ABNORMAL HIGH (ref 4.6–6.5)

## 2016-06-08 MED ORDER — SILDENAFIL CITRATE 100 MG PO TABS: ORAL_TABLET | ORAL | 11 refills | Status: DC

## 2016-06-08 MED ORDER — OLMESARTAN-AMLODIPINE-HCTZ 40-5-12.5 MG PO TABS: 1.0000 | ORAL_TABLET | Freq: Every day | ORAL | 1 refills | Status: DC

## 2016-06-08 MED ORDER — SITAGLIPTIN PHOS-METFORMIN HCL 50-1000 MG PO TABS: 1.0000 | ORAL_TABLET | Freq: Two times a day (BID) | ORAL | 1 refills | Status: DC

## 2016-06-08 MED ORDER — CANAGLIFLOZIN 300 MG PO TABS: 300.0000 mg | ORAL_TABLET | Freq: Every day | ORAL | 1 refills | Status: DC

## 2016-06-08 NOTE — Patient Instructions (Signed)

## 2016-06-08 NOTE — Progress Notes (Signed)
Subjective:  Patient ID: Mark Oconnor, male    DOB: 1980-11-11  Age: 36 y.o. MRN: [id number]  CC: Annual Exam; Hypertension; Hyperlipidemia; and Diabetes   HPI REXFORD PREVO presents for a CPX.  He complains that his blood sugars are not well-controlled. He says he consistently has blood sugars above 200. He complains of weight gain and has had a few polys. He has not been compliant with his diabetic medications.  He does tell me that his blood pressure is well-controlled on the current combination of an ARB/CCB/HCTZ. He's had no recent episodes of headache/blurred vision/chest pain/shortness of breath/palpitations/edema/fatigue.  Outpatient Medications Prior to Visit  Medication Sig Dispense Refill  . glucose blood (ONE TOUCH ULTRA TEST) test strip USE TO TEST BLOOD SUGAR TWICE DAILY    . Insulin Pen Needle 31G X 5 MM MISC 1 each by Does not apply route daily. Dx Code E11.8 100 each 1  . LEVEMIR FLEXTOUCH 100 UNIT/ML Pen INJECT 30 UNITS EACH MORNING. 15 mL 2  . ONE TOUCH ULTRA TEST test strip USE TO TEST BLOOD SUGAR TWICE DAILY 100 each 11  . Olmesartan-Amlodipine-HCTZ 40-5-12.5 MG TABS Take 1 tablet by mouth daily. 30 tablet 0  . albuterol (PROVENTIL HFA;VENTOLIN HFA) 108 (90 BASE) MCG/ACT inhaler Inhale 2 puffs into the lungs every 6 (six) hours as needed for wheezing or shortness of breath.    Marland Kitchen atorvastatin (LIPITOR) 40 MG tablet Take 1 tablet (40 mg total) by mouth daily. 30 tablet 3  . canagliflozin (INVOKANA) 300 MG TABS tablet Take 300 mg by mouth daily. (Patient not taking: Reported on 06/08/2016) 90 tablet 2  . glimepiride (AMARYL) 4 MG tablet Take 1 tablet (4 mg total) by mouth daily before breakfast. (Patient not taking: Reported on 06/08/2016) 30 tablet 11  . sitaGLIPtin-metformin (JANUMET) 50-1000 MG per tablet Take 1 tablet by mouth 2 (two) times daily with a meal. (Patient not taking: Reported on 06/08/2016) 180 tablet 1  . VIAGRA 100 MG tablet TAKE 1/2-1 BY MOUTH DAILY AS  NEEDED FOR ERECTILE DYSFUNCTION (Patient not taking: Reported on 06/08/2016) 10 tablet 11   No facility-administered medications prior to visit.     ROS Review of Systems  Constitutional: Positive for unexpected weight change. Negative for appetite change, diaphoresis and fatigue.  HENT: Negative.   Eyes: Negative for visual disturbance.  Respiratory: Positive for apnea. Negative for cough, chest tightness, shortness of breath and wheezing.   Cardiovascular: Negative for chest pain, palpitations and leg swelling.  Gastrointestinal: Negative for abdominal pain, constipation, diarrhea, nausea and vomiting.  Endocrine: Positive for polydipsia, polyphagia and polyuria. Negative for cold intolerance and heat intolerance.  Genitourinary: Negative.  Negative for difficulty urinating, dysuria, hematuria, penile swelling, scrotal swelling, testicular pain and urgency.  Musculoskeletal: Negative for back pain, myalgias and neck pain.  Skin: Negative.  Negative for color change and rash.  Allergic/Immunologic: Negative.   Neurological: Negative.  Negative for dizziness, weakness and headaches.  Hematological: Negative for adenopathy. Does not bruise/bleed easily.  Psychiatric/Behavioral: Negative.     Objective:  BP 124/88 (BP Location: Left Arm, Patient Position: Sitting, Cuff Size: Large)   Pulse 68   Temp 98.4 F (36.9 C) (Oral)   Ht 6\' 3"  (1.905 m)   Wt 259 lb (117.5 kg)   SpO2 98%   BMI 32.37 kg/m   BP Readings from Last 3 Encounters:  06/08/16 124/88  08/03/15 112/74  04/19/15 (!) 134/93    Wt Readings from Last 3 Encounters:  06/08/16 259 lb (117.5 kg)  08/03/15 253 lb (114.8 kg)  04/19/15 244 lb (110.7 kg)    Physical Exam  Constitutional: He is oriented to person, place, and time. No distress.  HENT:  Mouth/Throat: Oropharynx is clear and moist. No oropharyngeal exudate.  Eyes: Conjunctivae are normal. Right eye exhibits no discharge. Left eye exhibits no discharge. No  scleral icterus.  Neck: Normal range of motion. Neck supple. No JVD present. No tracheal deviation present. No thyromegaly present.  Cardiovascular: Normal rate, regular rhythm, normal heart sounds and intact distal pulses.  Exam reveals no gallop and no friction rub.   No murmur heard. Pulmonary/Chest: Effort normal and breath sounds normal. No stridor. No respiratory distress. He has no wheezes. He has no rales. He exhibits no tenderness.  Abdominal: Soft. Bowel sounds are normal. He exhibits no distension and no mass. There is no tenderness. There is no rebound and no guarding.  Musculoskeletal: Normal range of motion. He exhibits no edema, tenderness or deformity.  Lymphadenopathy:    He has no cervical adenopathy.  Neurological: He is oriented to person, place, and time.  Skin: Skin is warm and dry. No rash noted. He is not diaphoretic. No erythema. No pallor.  Vitals reviewed.   Lab Results  Component Value Date   WBC 5.4 06/08/2016   HGB 14.2 06/08/2016   HCT 43.1 06/08/2016   PLT 209.0 06/08/2016   GLUCOSE 344 (H) 06/08/2016   CHOL 230 (H) 06/08/2016   TRIG 150.0 (H) 06/08/2016   HDL 53.60 06/08/2016   LDLDIRECT 130.0 07/09/2014   LDLCALC 146 (H) 06/08/2016   ALT 27 06/08/2016   AST 21 06/08/2016   NA 134 (L) 06/08/2016   K 4.1 06/08/2016   CL 99 06/08/2016   CREATININE 1.09 06/08/2016   BUN 11 06/08/2016   CO2 26 06/08/2016   TSH 0.99 06/08/2016   PSA 0.53 04/26/2011   HGBA1C 14.2 Repeated and verified X2. (H) 06/08/2016   MICROALBUR <0.7 06/08/2016    No results found.  Assessment & Plan:   Quashaun was seen today for annual exam, hypertension, hyperlipidemia and diabetes.  Diagnoses and all orders for this visit:  Type 2 diabetes mellitus with complication, with long-term current use of insulin (HCC)- his A1c is up to 14.2%, will continue the basal insulin and will restart his oral medications. I will also asked him to follow-up with endocrinology to pursue  treatment options to get better blood sugar control. -     Hemoglobin A1c; Future -     Microalbumin / creatinine urine ratio; Future -     canagliflozin (INVOKANA) 300 MG TABS tablet; Take 1 tablet (300 mg total) by mouth daily. -     Olmesartan-Amlodipine-HCTZ 40-5-12.5 MG TABS; Take 1 tablet by mouth daily. -     sitaGLIPtin-metformin (JANUMET) 50-1000 MG tablet; Take 1 tablet by mouth 2 (two) times daily with a meal. -     Ambulatory referral to Endocrinology  Bariatric surgery status- I will monitor his labs for vitamin deficiencies. -     IBC panel; Future -     Vitamin B12; Future -     Folate; Future -     Ferritin; Future -     VITAMIN D 25 Hydroxy (Vit-D Deficiency, Fractures); Future -     Vitamin B6; Future -     Vitamin B1; Future -     Zinc; Future -     cyanocobalamin 2000 MCG tablet; Take 1 tablet (2,000 mcg  total) by mouth daily. -     Cholecalciferol 2000 units TABS; Take 1 tablet (2,000 Units total) by mouth daily.  Hyperlipidemia with target LDL less than 100- his Framingham risk score is only 2% so at this time I do not recommend that he take a statin.  Pure hyperglyceridemia- his triglycerides are very mildly elevated at 150, this does not need to be treated with the medication. He was encouraged to improve his lifestyle modifications.  Gastroesophageal reflux disease without esophagitis- this is well-controlled  Routine general medical examination at a health care facility- exam completed, labs ordered and reviewed, he refused a flu vaccine today, patient education material was given. -     Lipid panel; Future -     Comprehensive metabolic panel; Future -     CBC with Differential/Platelet; Future -     TSH; Future -     Urinalysis, Routine w reflex microscopic; Future  Essential hypertension, benign- his blood pressure is adequately well controlled, electrolytes and renal function are normal. -     Olmesartan-Amlodipine-HCTZ 40-5-12.5 MG TABS; Take 1 tablet  by mouth daily.  Erectile dysfunction due to arterial insufficiency -     sildenafil (VIAGRA) 100 MG tablet; TAKE 1/2-1 BY MOUTH DAILY AS NEEDED FOR ERECTILE DYSFUNCTION  B12 deficiency -     cyanocobalamin 2000 MCG tablet; Take 1 tablet (2,000 mcg total) by mouth daily.  Vitamin D deficiency -     Cholecalciferol 2000 units TABS; Take 1 tablet (2,000 Units total) by mouth daily.   I have discontinued Mr. Allayne ButcherGaines's atorvastatin and glimepiride. I have changed his VIAGRA to sildenafil. I have also changed his canagliflozin and sitaGLIPtin-metformin. Additionally, I am having him start on cyanocobalamin and Cholecalciferol. Lastly, I am having him maintain his albuterol, glucose blood, ONE TOUCH ULTRA TEST, Insulin Pen Needle, LEVEMIR FLEXTOUCH, and Olmesartan-Amlodipine-HCTZ.  Meds ordered this encounter  Medications  . canagliflozin (INVOKANA) 300 MG TABS tablet    Sig: Take 1 tablet (300 mg total) by mouth daily.    Dispense:  90 tablet    Refill:  1  . Olmesartan-Amlodipine-HCTZ 40-5-12.5 MG TABS    Sig: Take 1 tablet by mouth daily.    Dispense:  90 tablet    Refill:  1  . sitaGLIPtin-metformin (JANUMET) 50-1000 MG tablet    Sig: Take 1 tablet by mouth 2 (two) times daily with a meal.    Dispense:  180 tablet    Refill:  1  . sildenafil (VIAGRA) 100 MG tablet    Sig: TAKE 1/2-1 BY MOUTH DAILY AS NEEDED FOR ERECTILE DYSFUNCTION    Dispense:  10 tablet    Refill:  11  . cyanocobalamin 2000 MCG tablet    Sig: Take 1 tablet (2,000 mcg total) by mouth daily.    Dispense:  90 tablet    Refill:  3  . Cholecalciferol 2000 units TABS    Sig: Take 1 tablet (2,000 Units total) by mouth daily.    Dispense:  90 tablet    Refill:  3     Follow-up: Return in about 3 months (around 09/05/2016).  Sanda Lingerhomas Ryott Rafferty, MD

## 2016-06-08 NOTE — Progress Notes (Signed)
Pre visit review using our clinic review tool, if applicable. No additional management support is needed unless otherwise documented below in the visit note. 

## 2016-06-09 DIAGNOSIS — E559 Vitamin D deficiency, unspecified: Secondary | ICD-10-CM | POA: Insufficient documentation

## 2016-06-09 DIAGNOSIS — E538 Deficiency of other specified B group vitamins: Secondary | ICD-10-CM | POA: Insufficient documentation

## 2016-06-09 LAB — LIPID PANEL
CHOL/HDL RATIO: 4
Cholesterol: 230 mg/dL — ABNORMAL HIGH (ref 0–200)
HDL: 53.6 mg/dL (ref >39.00)
LDL Cholesterol: 146 mg/dL — ABNORMAL HIGH (ref 0–99)
NonHDL: 176.29
TRIGLYCERIDES: 150 mg/dL — AB (ref 0.0–149.0)
VLDL: 30 mg/dL (ref 0.0–40.0)

## 2016-06-09 LAB — COMPREHENSIVE METABOLIC PANEL
ALBUMIN: 4.5 g/dL (ref 3.5–5.2)
ALT: 27 U/L (ref 0–53)
AST: 21 U/L (ref 0–37)
Alkaline Phosphatase: 54 U/L (ref 39–117)
BUN: 11 mg/dL (ref 6–23)
CALCIUM: 9.4 mg/dL (ref 8.4–10.5)
CO2: 26 mEq/L (ref 19–32)
CREATININE: 1.09 mg/dL (ref 0.40–1.50)
Chloride: 99 mEq/L (ref 96–112)
GFR: 98.48 mL/min (ref >60.00)
Glucose, Bld: 344 mg/dL — ABNORMAL HIGH (ref 70–99)
Potassium: 4.1 mEq/L (ref 3.5–5.1)
Sodium: 134 mEq/L — ABNORMAL LOW (ref 135–145)
Total Bilirubin: 0.4 mg/dL (ref 0.2–1.2)
Total Protein: 7.2 g/dL (ref 6.0–8.3)

## 2016-06-09 LAB — IBC PANEL
Iron: 102 ug/dL (ref 42–165)
Saturation Ratios: 30.2 % (ref 20.0–50.0)
TRANSFERRIN: 241 mg/dL (ref 212.0–360.0)

## 2016-06-09 LAB — FOLATE: FOLATE: 15.8 ng/mL (ref >5.9)

## 2016-06-09 LAB — VITAMIN B12: Vitamin B-12: 370 pg/mL (ref 211–911)

## 2016-06-09 LAB — TSH: TSH: 0.99 u[IU]/mL (ref 0.35–4.50)

## 2016-06-09 LAB — VITAMIN D 25 HYDROXY (VIT D DEFICIENCY, FRACTURES): VITD: 16.69 ng/mL — ABNORMAL LOW (ref 30.00–100.00)

## 2016-06-09 LAB — FERRITIN: Ferritin: 171.6 ng/mL (ref 22.0–322.0)

## 2016-06-09 MED ORDER — CYANOCOBALAMIN 2000 MCG PO TABS: 2000.0000 ug | ORAL_TABLET | Freq: Every day | ORAL | 3 refills | Status: DC

## 2016-06-09 MED ORDER — CHOLECALCIFEROL 50 MCG (2000 UT) PO TABS: 1.0000 | ORAL_TABLET | Freq: Every day | ORAL | 3 refills | Status: DC

## 2016-06-10 LAB — ZINC: Zinc: 60 ug/dL (ref 60–130)

## 2016-06-12 LAB — VITAMIN B1: VITAMIN B1 (THIAMINE): 11 nmol/L (ref 8–30)

## 2016-06-13 ENCOUNTER — Encounter: Payer: Self-pay | Admitting: Endocrinology

## 2016-06-13 ENCOUNTER — Ambulatory Visit (INDEPENDENT_AMBULATORY_CARE_PROVIDER_SITE_OTHER): Payer: BLUE CROSS/BLUE SHIELD | Admitting: Endocrinology

## 2016-06-13 ENCOUNTER — Encounter: Payer: Self-pay | Admitting: Internal Medicine

## 2016-06-13 ENCOUNTER — Ambulatory Visit: Payer: BLUE CROSS/BLUE SHIELD | Admitting: Endocrinology

## 2016-06-13 VITALS — BP 116/82 | HR 71 | Ht 75.0 in | Wt 251.0 lb

## 2016-06-13 DIAGNOSIS — Z794 Long term (current) use of insulin: Secondary | ICD-10-CM

## 2016-06-13 DIAGNOSIS — E118 Type 2 diabetes mellitus with unspecified complications: Secondary | ICD-10-CM

## 2016-06-13 LAB — VITAMIN B6: Vitamin B6: 25.8 ng/mL — ABNORMAL HIGH (ref 2.1–21.7)

## 2016-06-13 LAB — POCT GLYCOSYLATED HEMOGLOBIN (HGB A1C): HEMOGLOBIN A1C: 14.3

## 2016-06-13 MED ORDER — INSULIN DEGLUDEC 100 UNIT/ML ~~LOC~~ SOPN: 30.0000 [IU] | PEN_INJECTOR | SUBCUTANEOUS | 11 refills | Status: DC

## 2016-06-13 NOTE — Progress Notes (Signed)
Subjective:    Patient ID: Mark Oconnor, male    DOB: Dec 01, 1980, 36 y.o.   MRN: [id number]  HPI Pt returns for f/u of diabetes mellitus: DM type: Insulin-requiring type 2 Dx'ed: 2011 Complications: none Therapy: insulin since dx, and 3 oral meds DKA: never Severe hypoglycemia: never Pancreatitis: never Other: he had gastric bypass surgery in 2015--he has lost approx 80 lbs since then; he declines multiple daily injections.    Interval history: Pt says he continues to struggle with alcoholism.  He takes the levemir approx 3 times per week.  Then he tries to make up for missed doses, causing hypoglycemia.  Past Medical History:  Diagnosis Date  . Allergy   . Diabetes mellitus   . GERD (gastroesophageal reflux disease)   . Hyperlipidemia   . Hypertension   . Morbid obesity (HCC)   . Sleep apnea 05-21-13   denies.Stop/Bang score =4 with PAT visit    Past Surgical History:  Procedure Laterality Date  . BREATH TEK H PYLORI N/A 07/12/2012   Procedure: BREATH TEK H PYLORI;  Surgeon: Mariella Saa, MD;  Location: [facility];  Service: General;  Laterality: N/A;  . GASTRIC ROUX-EN-Y N/A 05/26/2013   Procedure: LAPAROSCOPIC ROUX-EN-Y GASTRIC BYPASS WITH UPPER ENDOSCOPY;  Surgeon: Mariella Saa, MD;  Location: [facility];  Service: General;  Laterality: N/A;    Social History   Social History  . Marital status: Married    Spouse name: N/A  . Number of children: N/A  . Years of education: N/A   Occupational History  . Not on file.   Social History Main Topics  . Smoking status: Former Smoker    Packs/day: 0.50    Years: 15.00    Types: Cigarettes    Quit date: 12/07/2011  . Smokeless tobacco: Never Used  . Alcohol use 6.0 oz/week    10 Cans of beer per week     Comment: occasional to rare  . Drug use: No  . Sexual activity: Yes   Other Topics Concern  . Not on file   Social History Narrative  . No narrative on file    Current Outpatient Prescriptions on  File Prior to Visit  Medication Sig Dispense Refill  . canagliflozin (INVOKANA) 300 MG TABS tablet Take 1 tablet (300 mg total) by mouth daily. 90 tablet 1  . Cholecalciferol 2000 units TABS Take 1 tablet (2,000 Units total) by mouth daily. 90 tablet 3  . cyanocobalamin 2000 MCG tablet Take 1 tablet (2,000 mcg total) by mouth daily. 90 tablet 3  . glucose blood (ONE TOUCH ULTRA TEST) test strip USE TO TEST BLOOD SUGAR TWICE DAILY    . Insulin Pen Needle 31G X 5 MM MISC 1 each by Does not apply route daily. Dx Code E11.8 100 each 1  . Olmesartan-Amlodipine-HCTZ 40-5-12.5 MG TABS Take 1 tablet by mouth daily. 90 tablet 1  . ONE TOUCH ULTRA TEST test strip USE TO TEST BLOOD SUGAR TWICE DAILY 100 each 11  . sildenafil (VIAGRA) 100 MG tablet TAKE 1/2-1 BY MOUTH DAILY AS NEEDED FOR ERECTILE DYSFUNCTION 10 tablet 11  . sitaGLIPtin-metformin (JANUMET) 50-1000 MG tablet Take 1 tablet by mouth 2 (two) times daily with a meal. 180 tablet 1  . albuterol (PROVENTIL HFA;VENTOLIN HFA) 108 (90 BASE) MCG/ACT inhaler Inhale 2 puffs into the lungs every 6 (six) hours as needed for wheezing or shortness of breath.     No current facility-administered medications on file prior to visit.  Allergies  Allergen Reactions  . Other Itching    Tree nuts     Family History  Problem Relation Age of Onset  . Diabetes Father   . Cancer Neg Hx   . Heart disease Neg Hx   . Hyperlipidemia Neg Hx   . Hypertension Neg Hx   . Diabetes Mother   . Diabetes Brother     one brother  diabetic    BP 116/82   Pulse 71   Ht 6\' 3"  (1.905 m)   Wt 251 lb (113.9 kg)   SpO2 98%   BMI 31.37 kg/m   Review of Systems Denies LOC    Objective:   Physical Exam VITAL SIGNS:  See vs page GENERAL: no distress Pulses: dorsalis pedis intact bilat.   MSK: no deformity of the feet.  CV: no leg edema.   Skin:  no ulcer on the feet.  normal color and temp on the feet. Neuro: sensation is intact to touch on the feet.    Lab  Results  Component Value Date   HGBA1C 14.2 Repeated and verified X2. (H) 06/08/2016       Assessment & Plan:  Insulin-requiring type 2 DM: worse Noncompliance with cbg recording and insulin: this complicates the rx of DM.    Patient is advised the following: Patient Instructions  It is really important to minimize alcohol intake, or avoid altogether.  You soul see a counselor or psychiatrist for this.  I would be happy to refer you.  check your blood sugar once a day.  vary the time of day when you check, between before the 3 meals, and at bedtime.  also check if you have symptoms of your blood sugar being too high or too low.  please keep a record of the readings and bring it to your next appointment here.  You can write it on any piece of paper.  please call us sooner if your blood sugar goes below 70, or if you have a lot of readings over 200.   To help you remember it, try putting it next to something you use each morning.   I have sent a prescription to your pharmacy, to change levemir to tresiba.  This is a really slow-release insulin.  That way, it you miss a day, you can make it up when you remember.   Please come back for a follow-up appointment in 3 months.

## 2016-06-13 NOTE — Patient Instructions (Addendum)
It is really important to minimize alcohol intake, or avoid altogether.  You soul see a counselor or psychiatrist for this.  I would be happy to refer you.  check your blood sugar once a day.  vary the time of day when you check, between before the 3 meals, and at bedtime.  also check if you have symptoms of your blood sugar being too high or too low.  please keep a record of the readings and bring it to your next appointment here.  You can write it on any piece of paper.  please call us sooner if your blood sugar goes below 70, or if you have a lot of readings over 200.   To help you remember it, try putting it next to something you use each morning.   I have sent a prescription to your pharmacy, to change levemir to tresiba.  This is a really slow-release insulin.  That way, it you miss a day, you can make it up when you remember.   Please come back for a follow-up appointment in 3 months.

## 2016-06-14 ENCOUNTER — Other Ambulatory Visit: Payer: Self-pay

## 2016-06-14 MED ORDER — INSULIN PEN NEEDLE 31G X 5 MM MISC
1.0000 | Freq: Every day | 1 refills | Status: DC
Start: 2016-06-14 — End: 2017-03-09

## 2016-07-06 ENCOUNTER — Other Ambulatory Visit: Payer: Self-pay | Admitting: Endocrinology

## 2016-07-06 NOTE — Telephone Encounter (Signed)
Please advise if ok to refill. Rx is not on current med list. Thanks!  

## 2016-07-13 DIAGNOSIS — R7309 Other abnormal glucose: Secondary | ICD-10-CM | POA: Diagnosis not present

## 2016-07-13 DIAGNOSIS — E08649 Diabetes mellitus due to underlying condition with hypoglycemia without coma: Secondary | ICD-10-CM | POA: Diagnosis not present

## 2016-07-13 DIAGNOSIS — H52229 Regular astigmatism, unspecified eye: Secondary | ICD-10-CM | POA: Diagnosis not present

## 2016-07-13 DIAGNOSIS — H538 Other visual disturbances: Secondary | ICD-10-CM | POA: Diagnosis not present

## 2016-09-11 ENCOUNTER — Ambulatory Visit: Payer: BLUE CROSS/BLUE SHIELD | Admitting: Endocrinology

## 2016-09-22 ENCOUNTER — Ambulatory Visit: Payer: BLUE CROSS/BLUE SHIELD | Admitting: Endocrinology

## 2016-10-11 ENCOUNTER — Ambulatory Visit (INDEPENDENT_AMBULATORY_CARE_PROVIDER_SITE_OTHER): Payer: BLUE CROSS/BLUE SHIELD | Admitting: Endocrinology

## 2016-10-11 ENCOUNTER — Encounter: Payer: Self-pay | Admitting: Endocrinology

## 2016-10-11 VITALS — BP 110/74 | HR 73 | Ht 75.0 in | Wt 256.0 lb

## 2016-10-11 DIAGNOSIS — Z7289 Other problems related to lifestyle: Secondary | ICD-10-CM

## 2016-10-11 DIAGNOSIS — F1090 Alcohol use, unspecified, uncomplicated: Secondary | ICD-10-CM

## 2016-10-11 DIAGNOSIS — E118 Type 2 diabetes mellitus with unspecified complications: Secondary | ICD-10-CM | POA: Diagnosis not present

## 2016-10-11 DIAGNOSIS — Z794 Long term (current) use of insulin: Secondary | ICD-10-CM | POA: Diagnosis not present

## 2016-10-11 LAB — POCT GLYCOSYLATED HEMOGLOBIN (HGB A1C): Hemoglobin A1C: 12.4

## 2016-10-11 MED ORDER — INSULIN DEGLUDEC 100 UNIT/ML ~~LOC~~ SOPN: 45.0000 [IU] | PEN_INJECTOR | SUBCUTANEOUS | 11 refills | Status: DC

## 2016-10-11 NOTE — Patient Instructions (Addendum)
Please see a psychology specialist.  you will receive a phone call, about a day and time for an appointment check your blood sugar once a day.  vary the time of day when you check, between before the 3 meals, and at bedtime.  also check if you have symptoms of your blood sugar being too high or too low.  please keep a record of the readings and bring it to your next appointment here.  You can write it on any piece of paper.  please call us sooner if your blood sugar goes below 70, or if you have a lot of readings over 200.   To help you remember it, try putting it next to something you use each morning.   I have sent a prescription to your pharmacy, to increase tresiba to 45 units daily.  This is a really slow-release insulin.  That way, it you miss a day, you can make it up when you remember.   Please continue the same other diabetes medications.  Please come back for a follow-up appointment in 3 months.

## 2016-10-11 NOTE — Progress Notes (Signed)
Subjective:    Patient ID: Mark Oconnor, male    DOB: 1980/06/09, 36 y.o.   MRN: [id number]  HPI Pt returns for f/u of diabetes mellitus: DM type: Insulin-requiring type 2 Dx'ed: 2011 Complications: none Therapy: insulin since dx, and 3 oral meds DKA: never Severe hypoglycemia: never Pancreatitis: never.   Other: he had gastric bypass surgery in 2015--he has lost approx 80 lbs since then; he declines multiple daily injections.   Interval history: Pt says he continues to struggle with alcohol intake.  He says he misses the tresiba altogether, approx 3 times per week.  no cbg record, but states cbg's are persistently in the 200's.   Past Medical History:  Diagnosis Date  . Allergy   . Diabetes mellitus   . GERD (gastroesophageal reflux disease)   . Hyperlipidemia   . Hypertension   . Morbid obesity (HCC)   . Sleep apnea 05-21-13   denies.Stop/Bang score =4 with PAT visit    Past Surgical History:  Procedure Laterality Date  . BREATH TEK H PYLORI N/A 07/12/2012   Procedure: BREATH TEK H PYLORI;  Surgeon: Mariella Saa, MD;  Location: [facility];  Service: General;  Laterality: N/A;  . GASTRIC ROUX-EN-Y N/A 05/26/2013   Procedure: LAPAROSCOPIC ROUX-EN-Y GASTRIC BYPASS WITH UPPER ENDOSCOPY;  Surgeon: Mariella Saa, MD;  Location: [facility];  Service: General;  Laterality: N/A;    Social History   Social History  . Marital status: Married    Spouse name: N/A  . Number of children: N/A  . Years of education: N/A   Occupational History  . Not on file.   Social History Main Topics  . Smoking status: Former Smoker    Packs/day: 0.50    Years: 15.00    Types: Cigarettes    Quit date: 12/07/2011  . Smokeless tobacco: Never Used  . Alcohol use 6.0 oz/week    10 Cans of beer per week     Comment: occasional to rare  . Drug use: No  . Sexual activity: Yes   Other Topics Concern  . Not on file   Social History Narrative  . No narrative on file    Current  Outpatient Prescriptions on File Prior to Visit  Medication Sig Dispense Refill  . canagliflozin (INVOKANA) 300 MG TABS tablet Take 1 tablet (300 mg total) by mouth daily. 90 tablet 1  . Cholecalciferol 2000 units TABS Take 1 tablet (2,000 Units total) by mouth daily. 90 tablet 3  . cyanocobalamin 2000 MCG tablet Take 1 tablet (2,000 mcg total) by mouth daily. 90 tablet 3  . glucose blood (ONE TOUCH ULTRA TEST) test strip USE TO TEST BLOOD SUGAR TWICE DAILY    . Insulin Pen Needle 31G X 5 MM MISC 1 each by Does not apply route daily. Dx Code E11.8 100 each 1  . Olmesartan-Amlodipine-HCTZ 40-5-12.5 MG TABS Take 1 tablet by mouth daily. 90 tablet 1  . sildenafil (VIAGRA) 100 MG tablet TAKE 1/2-1 BY MOUTH DAILY AS NEEDED FOR ERECTILE DYSFUNCTION 10 tablet 11  . sitaGLIPtin-metformin (JANUMET) 50-1000 MG tablet Take 1 tablet by mouth 2 (two) times daily with a meal. 180 tablet 1  . albuterol (PROVENTIL HFA;VENTOLIN HFA) 108 (90 BASE) MCG/ACT inhaler Inhale 2 puffs into the lungs every 6 (six) hours as needed for wheezing or shortness of breath.    . ONE TOUCH ULTRA TEST test strip USE TO TEST BLOOD SUGAR TWICE DAILY (Patient not taking: Reported on 10/11/2016) 100 each  11   No current facility-administered medications on file prior to visit.     Allergies  Allergen Reactions  . Other Itching    Tree nuts     Family History  Problem Relation Age of Onset  . Diabetes Father   . Cancer Neg Hx   . Heart disease Neg Hx   . Hyperlipidemia Neg Hx   . Hypertension Neg Hx   . Diabetes Mother   . Diabetes Brother        one brother  diabetic    BP 110/74   Pulse 73   Ht 6\' 3"  (1.905 m)   Wt 256 lb (116.1 kg)   SpO2 96%   BMI 32.00 kg/m    Review of Systems He denies hypoglycemia    Objective:   Physical Exam VITAL SIGNS:  See vs page GENERAL: no distress Pulses: dorsalis pedis intact bilat.   MSK: no deformity of the feet CV: no leg edema Skin:  no ulcer on the feet.  normal  color and temp on the feet. Neuro: sensation is intact to touch on the feet.  Lab Results  Component Value Date   HGBA1C 12.4 10/11/2016      Assessment & Plan:  Insulin-requiring type 2 DM: ongoing poor control.  Alcoholism, persistent.  Noncompliance with cbg recording and insulin, prob exac by alcoholism.   Patient Instructions  Please see a psychology specialist.  you will receive a phone call, about a day and time for an appointment check your blood sugar once a day.  vary the time of day when you check, between before the 3 meals, and at bedtime.  also check if you have symptoms of your blood sugar being too high or too low.  please keep a record of the readings and bring it to your next appointment here.  You can write it on any piece of paper.  please call us sooner if your blood sugar goes below 70, or if you have a lot of readings over 200.   To help you remember it, try putting it next to something you use each morning.   I have sent a prescription to your pharmacy, to increase tresiba to 45 units daily.  This is a really slow-release insulin.  That way, it you miss a day, you can make it up when you remember.   Please continue the same other diabetes medications.  Please come back for a follow-up appointment in 3 months.

## 2016-10-24 ENCOUNTER — Other Ambulatory Visit: Payer: Self-pay | Admitting: Internal Medicine

## 2016-10-26 ENCOUNTER — Other Ambulatory Visit: Payer: Self-pay | Admitting: Endocrinology

## 2016-12-26 ENCOUNTER — Encounter (HOSPITAL_COMMUNITY): Payer: Self-pay

## 2017-01-11 ENCOUNTER — Encounter: Payer: Self-pay | Admitting: Endocrinology

## 2017-01-11 ENCOUNTER — Other Ambulatory Visit: Payer: Self-pay | Admitting: Internal Medicine

## 2017-01-11 ENCOUNTER — Ambulatory Visit (INDEPENDENT_AMBULATORY_CARE_PROVIDER_SITE_OTHER): Payer: BLUE CROSS/BLUE SHIELD | Admitting: Endocrinology

## 2017-01-11 ENCOUNTER — Ambulatory Visit: Payer: BLUE CROSS/BLUE SHIELD | Admitting: Endocrinology

## 2017-01-11 VITALS — BP 116/72 | HR 87 | Wt 245.8 lb

## 2017-01-11 DIAGNOSIS — F1090 Alcohol use, unspecified, uncomplicated: Secondary | ICD-10-CM

## 2017-01-11 DIAGNOSIS — N5201 Erectile dysfunction due to arterial insufficiency: Secondary | ICD-10-CM

## 2017-01-11 DIAGNOSIS — Z794 Long term (current) use of insulin: Secondary | ICD-10-CM

## 2017-01-11 DIAGNOSIS — E118 Type 2 diabetes mellitus with unspecified complications: Secondary | ICD-10-CM

## 2017-01-11 DIAGNOSIS — Z7289 Other problems related to lifestyle: Secondary | ICD-10-CM

## 2017-01-11 LAB — POCT GLYCOSYLATED HEMOGLOBIN (HGB A1C): HEMOGLOBIN A1C: 13.7

## 2017-01-11 MED ORDER — INSULIN DEGLUDEC 100 UNIT/ML ~~LOC~~ SOPN: 45.0000 [IU] | PEN_INJECTOR | SUBCUTANEOUS | 11 refills | Status: DC

## 2017-01-11 NOTE — Progress Notes (Signed)
Subjective:    Patient ID: Mark Oconnor, male    DOB: 12/28/1980, 36 y.o.   MRN: [id number]  HPI Pt returns for f/u of diabetes mellitus: DM type: Insulin-requiring type 2 Dx'ed: 2011 Complications: none Therapy: insulin since dx, and 3 oral meds DKA: never Severe hypoglycemia: never Pancreatitis: never.   Other: he had gastric bypass surgery in 2015--he has lost approx 90 lbs since then, but pt feels this precipitated excessive alcohol intake; he declines multiple daily injections.   Interval history: Pt says alcohol intake continues to compromise the care of his DM.  He says he often misses the tresiba.  no cbg record, but states cbg's are persistently in the 300's.  Past Medical History:  Diagnosis Date  . Allergy   . Diabetes mellitus   . GERD (gastroesophageal reflux disease)   . Hyperlipidemia   . Hypertension   . Morbid obesity (HCC)   . Sleep apnea 05-21-13   denies.Stop/Bang score =4 with PAT visit    Past Surgical History:  Procedure Laterality Date  . BREATH TEK H PYLORI N/A 07/12/2012   Procedure: BREATH TEK H PYLORI;  Surgeon: Mariella SaaBenjamin T Hoxworth, MD;  Location: [facility];  Service: General;  Laterality: N/A;  . GASTRIC ROUX-EN-Y N/A 05/26/2013   Procedure: LAPAROSCOPIC ROUX-EN-Y GASTRIC BYPASS WITH UPPER ENDOSCOPY;  Surgeon: Mariella SaaBenjamin T Hoxworth, MD;  Location: [facility];  Service: General;  Laterality: N/A;    Social History   Social History  . Marital status: Married    Spouse name: N/A  . Number of children: N/A  . Years of education: N/A   Occupational History  . Not on file.   Social History Main Topics  . Smoking status: Former Smoker    Packs/day: 0.50    Years: 15.00    Types: Cigarettes    Quit date: 12/07/2011  . Smokeless tobacco: Never Used  . Alcohol use 6.0 oz/week    10 Cans of beer per week     Comment: occasional to rare  . Drug use: No  . Sexual activity: Yes   Other Topics Concern  . Not on file   Social History Narrative    . No narrative on file    Current Outpatient Prescriptions on File Prior to Visit  Medication Sig Dispense Refill  . albuterol (PROVENTIL HFA;VENTOLIN HFA) 108 (90 BASE) MCG/ACT inhaler Inhale 2 puffs into the lungs every 6 (six) hours as needed for wheezing or shortness of breath.    . canagliflozin (INVOKANA) 300 MG TABS tablet Take 1 tablet (300 mg total) by mouth daily. 90 tablet 1  . Cholecalciferol 2000 units TABS Take 1 tablet (2,000 Units total) by mouth daily. 90 tablet 3  . cyanocobalamin 2000 MCG tablet Take 1 tablet (2,000 mcg total) by mouth daily. 90 tablet 3  . glucose blood (ONE TOUCH ULTRA TEST) test strip USE TO TEST BLOOD SUGAR TWICE DAILY    . Insulin Pen Needle 31G X 5 MM MISC 1 each by Does not apply route daily. Dx Code E11.8 100 each 1  . Olmesartan-Amlodipine-HCTZ 40-5-12.5 MG TABS Take 1 tablet by mouth daily. 90 tablet 1  . ONE TOUCH ULTRA TEST test strip USE TO TEST BLOOD SUGAR TWICE DAILY (Patient not taking: Reported on 10/11/2016) 100 each 11  . sitaGLIPtin-metformin (JANUMET) 50-1000 MG tablet Take 1 tablet by mouth 2 (two) times daily with a meal. 180 tablet 1   No current facility-administered medications on file prior to visit.  Allergies  Allergen Reactions  . Other Itching    Tree nuts     Family History  Problem Relation Age of Onset  . Diabetes Father   . Cancer Neg Hx   . Heart disease Neg Hx   . Hyperlipidemia Neg Hx   . Hypertension Neg Hx   . Diabetes Mother   . Diabetes Brother        one brother  diabetic    BP 116/72   Pulse 87   Wt 245 lb 12.8 oz (111.5 kg)   SpO2 98%   BMI 30.72 kg/m    Review of Systems He denies hypoglycemia.      Objective:   Physical Exam VITAL SIGNS:  See vs page GENERAL: no distress  Pulses: foot pulses are intact bilaterally.   MSK: no deformity of the feet or ankles.  CV: no edema of the legs or ankles Skin:  no ulcer on the feet or ankles.  normal color and temp on the feet and  ankles Neuro: sensation is intact to touch on the feet and ankles.     A1c=13.7%    Assessment & Plan:  Insulin-requiring type 2 DM: worse Alcoholism, persistent: we discussed.  This is compromising the rx of his DM  Patient Instructions  Please see a specialist for the alcohol intake.  you will receive a phone call, about a day and time for an appointment. Please continue the same tresiba.  check your blood sugar twice a day.  vary the time of day when you check, between before the 3 meals, and at bedtime.  also check if you have symptoms of your blood sugar being too high or too low.  please keep a record of the readings and bring it to your next appointment here (or you can bring the meter itself).  You can write it on any piece of paper.  please call us sooner if your blood sugar goes below 70, or if you have a lot of readings over 200. Please come back for a follow-up appointment in 2 months.

## 2017-01-11 NOTE — Patient Instructions (Addendum)
Please see a specialist for the alcohol intake.  you will receive a phone call, about a day and time for an appointment. Please continue the same tresiba.  check your blood sugar twice a day.  vary the time of day when you check, between before the 3 meals, and at bedtime.  also check if you have symptoms of your blood sugar being too high or too low.  please keep a record of the readings and bring it to your next appointment here (or you can bring the meter itself).  You can write it on any piece of paper.  please call us sooner if your blood sugar goes below 70, or if you have a lot of readings over 200. Please come back for a follow-up appointment in 2 months.

## 2017-02-21 ENCOUNTER — Other Ambulatory Visit: Payer: Self-pay | Admitting: Internal Medicine

## 2017-03-09 ENCOUNTER — Other Ambulatory Visit: Payer: Self-pay | Admitting: Endocrinology

## 2017-03-13 ENCOUNTER — Ambulatory Visit: Payer: BLUE CROSS/BLUE SHIELD | Admitting: Endocrinology

## 2017-03-16 ENCOUNTER — Encounter: Payer: Self-pay | Admitting: Endocrinology

## 2017-03-16 ENCOUNTER — Ambulatory Visit: Payer: BLUE CROSS/BLUE SHIELD | Admitting: Endocrinology

## 2017-03-16 VITALS — BP 120/82 | HR 64 | Wt 247.2 lb

## 2017-03-16 DIAGNOSIS — Z794 Long term (current) use of insulin: Secondary | ICD-10-CM | POA: Diagnosis not present

## 2017-03-16 DIAGNOSIS — E118 Type 2 diabetes mellitus with unspecified complications: Secondary | ICD-10-CM | POA: Diagnosis not present

## 2017-03-16 LAB — POCT GLYCOSYLATED HEMOGLOBIN (HGB A1C): Hemoglobin A1C: 12.5

## 2017-03-16 MED ORDER — CANAGLIFLOZIN 300 MG PO TABS: 300.0000 mg | ORAL_TABLET | Freq: Every day | ORAL | 1 refills | Status: DC

## 2017-03-16 MED ORDER — SITAGLIPTIN PHOS-METFORMIN HCL 50-1000 MG PO TABS: 1.0000 | ORAL_TABLET | Freq: Two times a day (BID) | ORAL | 3 refills | Status: DC

## 2017-03-16 MED ORDER — INSULIN DEGLUDEC 100 UNIT/ML ~~LOC~~ SOPN: 45.0000 [IU] | PEN_INJECTOR | SUBCUTANEOUS | 11 refills | Status: DC

## 2017-03-16 NOTE — Progress Notes (Signed)
Subjective:    Patient ID: Mark Oconnor, male    DOB: 09/17/1980, 36 y.o.   MRN: [id number]  HPI Pt returns for f/u of diabetes mellitus: DM type: Insulin-requiring type 2 Dx'ed: 2011 Complications: none Therapy: insulin since dx, and 3 oral meds.   DKA: never Severe hypoglycemia: never.  Pancreatitis: never.   Other: he had gastric bypass surgery in 2015--he has lost approx 90 lbs since then, but pt feels this precipitated excessive alcohol intake; he declines multiple daily injections.   Interval history: Pt says he has been taking levemir instead of tresiba.  He switched back approx 3 weeks ago.  Since then, cbg's vary from 65-220.  pt states he feels well in general.  Past Medical History:  Diagnosis Date  . Allergy   . Diabetes mellitus   . GERD (gastroesophageal reflux disease)   . Hyperlipidemia   . Hypertension   . Morbid obesity (HCC)   . Sleep apnea 05-21-13   denies.Stop/Bang score =4 with PAT visit    No past surgical history on file.  Social History   Socioeconomic History  . Marital status: Married    Spouse name: Not on file  . Number of children: Not on file  . Years of education: Not on file  . Highest education level: Not on file  Social Needs  . Financial resource strain: Not on file  . Food insecurity - worry: Not on file  . Food insecurity - inability: Not on file  . Transportation needs - medical: Not on file  . Transportation needs - non-medical: Not on file  Occupational History  . Not on file  Tobacco Use  . Smoking status: Former Smoker    Packs/day: 0.50    Years: 15.00    Pack years: 7.50    Types: Cigarettes    Last attempt to quit: 12/07/2011    Years since quitting: 5.2  . Smokeless tobacco: Never Used  Substance and Sexual Activity  . Alcohol use: Yes    Alcohol/week: 6.0 oz    Types: 10 Cans of beer per week    Comment: occasional to rare  . Drug use: No  . Sexual activity: Yes  Other Topics Concern  . Not on file    Social History Narrative  . Not on file    Current Outpatient Medications on File Prior to Visit  Medication Sig Dispense Refill  . glucose blood (ONE TOUCH ULTRA TEST) test strip USE TO TEST BLOOD SUGAR TWICE DAILY    . Olmesartan-Amlodipine-HCTZ 40-5-12.5 MG TABS Take 1 tablet by mouth daily. 90 tablet 1  . ONE TOUCH ULTRA TEST test strip USE TO TEST BLOOD SUGAR TWICE DAILY 100 each 11  . VIAGRA 100 MG tablet TAKE 1/2-1 BY MOUTH DAILY AS NEEDED FOR ERECTILE DYSFUNCTION 10 tablet 1  . albuterol (PROVENTIL HFA;VENTOLIN HFA) 108 (90 BASE) MCG/ACT inhaler Inhale 2 puffs into the lungs every 6 (six) hours as needed for wheezing or shortness of breath.    . B-D UF III MINI PEN NEEDLES 31G X 5 MM MISC USE DAILY AS DIRECTED DX CODE E11.8 (Patient not taking: Reported on 03/16/2017) 100 each 11  . Cholecalciferol 2000 units TABS Take 1 tablet (2,000 Units total) by mouth daily. (Patient not taking: Reported on 03/16/2017) 90 tablet 3  . cyanocobalamin 2000 MCG tablet Take 1 tablet (2,000 mcg total) by mouth daily. (Patient not taking: Reported on 03/16/2017) 90 tablet 3   No current facility-administered medications on file  prior to visit.     Allergies  Allergen Reactions  . Other Itching    Tree nuts     Family History  Problem Relation Age of Onset  . Diabetes Father   . Cancer Neg Hx   . Heart disease Neg Hx   . Hyperlipidemia Neg Hx   . Hypertension Neg Hx   . Diabetes Mother   . Diabetes Brother        one brother  diabetic    BP 120/82 (BP Location: Left Arm, Patient Position: Sitting, Cuff Size: Normal)   Pulse 64   Wt 247 lb 3.2 oz (112.1 kg)   SpO2 99%   BMI 30.90 kg/m   Review of Systems Denies LOC.      Objective:   Physical Exam VITAL SIGNS:  See vs page GENERAL: no distress Pulses: foot pulses are intact bilaterally.   MSK: no deformity of the feet or ankles.  CV: no edema of the legs or ankles Skin:  no ulcer on the feet or ankles.  normal color and temp on  the feet and ankles Neuro: sensation is intact to touch on the feet and ankles.       Assessment & Plan:  Insulin-requiring type 2 DM: glycemic control is apparently improved: he declines to check fructosamine today Noncompliance with insulin. We discussed. Same tresiba.   Patient Instructions  Please continue the same medications for diabetes check your blood sugar twice a day.  vary the time of day when you check, between before the 3 meals, and at bedtime.  also check if you have symptoms of your blood sugar being too high or too low.  please keep a record of the readings and bring it to your next appointment here (or you can bring the meter itself).  You can write it on any piece of paper.  please call us sooner if your blood sugar goes below 70, or if you have a lot of readings over 200.   Please come back for a follow-up appointment in 3 months.

## 2017-03-16 NOTE — Patient Instructions (Addendum)
Please continue the same medications for diabetes.   check your blood sugar twice a day.  vary the time of day when you check, between before the 3 meals, and at bedtime.  also check if you have symptoms of your blood sugar being too high or too low.  please keep a record of the readings and bring it to your next appointment here (or you can bring the meter itself).  You can write it on any piece of paper.  please call us sooner if your blood sugar goes below 70, or if you have a lot of readings over 200.  Please come back for a follow-up appointment in 3 months.   

## 2017-03-29 ENCOUNTER — Other Ambulatory Visit: Payer: Self-pay | Admitting: Internal Medicine

## 2017-03-29 DIAGNOSIS — Z794 Long term (current) use of insulin: Secondary | ICD-10-CM

## 2017-03-29 DIAGNOSIS — E118 Type 2 diabetes mellitus with unspecified complications: Secondary | ICD-10-CM

## 2017-03-29 DIAGNOSIS — I10 Essential (primary) hypertension: Secondary | ICD-10-CM

## 2017-03-30 NOTE — Telephone Encounter (Signed)
olmesartan refused. Pt needs an appt.

## 2017-04-05 NOTE — Telephone Encounter (Signed)
Can you call patient and have him make an appointment.

## 2017-04-06 NOTE — Telephone Encounter (Signed)
Tried calling 1x Unable to lvm , vm not set up,

## 2017-04-06 NOTE — Telephone Encounter (Signed)
Appt made for 12/6, please advise on refill

## 2017-04-12 ENCOUNTER — Ambulatory Visit: Payer: BLUE CROSS/BLUE SHIELD | Admitting: Internal Medicine

## 2017-04-18 ENCOUNTER — Ambulatory Visit: Payer: BLUE CROSS/BLUE SHIELD | Admitting: Internal Medicine

## 2017-05-31 DIAGNOSIS — R6889 Other general symptoms and signs: Secondary | ICD-10-CM | POA: Diagnosis not present

## 2017-06-05 ENCOUNTER — Telehealth: Payer: Self-pay

## 2017-06-05 NOTE — Telephone Encounter (Signed)
Refill meds for olmesartan-amlodipine-HCTZ from [facility] . Lov 06/08/2016. pcp instruction to f/u in 3 months. Pt needs an appt for any refill

## 2017-06-06 NOTE — Telephone Encounter (Signed)
Informed pt he need to schedule an Office Visit. I ask pt to call back to schedule that appointment.  

## 2017-06-08 NOTE — Telephone Encounter (Signed)
Call pt and schedule for rx refill on 06/19/17.

## 2017-06-19 ENCOUNTER — Other Ambulatory Visit (INDEPENDENT_AMBULATORY_CARE_PROVIDER_SITE_OTHER): Payer: BLUE CROSS/BLUE SHIELD

## 2017-06-19 ENCOUNTER — Ambulatory Visit: Payer: BLUE CROSS/BLUE SHIELD | Admitting: Endocrinology

## 2017-06-19 ENCOUNTER — Encounter: Payer: Self-pay | Admitting: Internal Medicine

## 2017-06-19 ENCOUNTER — Ambulatory Visit: Payer: BLUE CROSS/BLUE SHIELD | Admitting: Internal Medicine

## 2017-06-19 VITALS — BP 120/80 | HR 65 | Temp 98.5°F | Resp 16 | Ht 75.0 in | Wt 256.1 lb

## 2017-06-19 DIAGNOSIS — E559 Vitamin D deficiency, unspecified: Secondary | ICD-10-CM

## 2017-06-19 DIAGNOSIS — Z794 Long term (current) use of insulin: Secondary | ICD-10-CM

## 2017-06-19 DIAGNOSIS — E118 Type 2 diabetes mellitus with unspecified complications: Secondary | ICD-10-CM

## 2017-06-19 DIAGNOSIS — I10 Essential (primary) hypertension: Secondary | ICD-10-CM | POA: Diagnosis not present

## 2017-06-19 DIAGNOSIS — E785 Hyperlipidemia, unspecified: Secondary | ICD-10-CM

## 2017-06-19 DIAGNOSIS — E538 Deficiency of other specified B group vitamins: Secondary | ICD-10-CM

## 2017-06-19 DIAGNOSIS — Z9884 Bariatric surgery status: Secondary | ICD-10-CM

## 2017-06-19 DIAGNOSIS — J0100 Acute maxillary sinusitis, unspecified: Secondary | ICD-10-CM

## 2017-06-19 DIAGNOSIS — E781 Pure hyperglyceridemia: Secondary | ICD-10-CM

## 2017-06-19 LAB — URINALYSIS, ROUTINE W REFLEX MICROSCOPIC
BILIRUBIN URINE: NEGATIVE
Hgb urine dipstick: NEGATIVE
Ketones, ur: NEGATIVE
LEUKOCYTES UA: NEGATIVE
Nitrite: NEGATIVE
Specific Gravity, Urine: 1.01 (ref 1.000–1.030)
TOTAL PROTEIN, URINE-UPE24: NEGATIVE
Urobilinogen, UA: 0.2 (ref 0.0–1.0)
pH: 6 (ref 5.0–8.0)

## 2017-06-19 LAB — COMPREHENSIVE METABOLIC PANEL
ALK PHOS: 52 U/L (ref 39–117)
ALT: 20 U/L (ref 0–53)
AST: 14 U/L (ref 0–37)
Albumin: 4.1 g/dL (ref 3.5–5.2)
BUN: 12 mg/dL (ref 6–23)
CALCIUM: 8.8 mg/dL (ref 8.4–10.5)
CO2: 31 mEq/L (ref 19–32)
Chloride: 102 mEq/L (ref 96–112)
Creatinine, Ser: 1.03 mg/dL (ref 0.40–1.50)
GFR: 104.53 mL/min (ref >60.00)
Glucose, Bld: 256 mg/dL — ABNORMAL HIGH (ref 70–99)
POTASSIUM: 4 meq/L (ref 3.5–5.1)
Sodium: 138 mEq/L (ref 135–145)
TOTAL PROTEIN: 6.9 g/dL (ref 6.0–8.3)
Total Bilirubin: 0.3 mg/dL (ref 0.2–1.2)

## 2017-06-19 LAB — IBC PANEL
Iron: 58 ug/dL (ref 42–165)
SATURATION RATIOS: 16.6 % — AB (ref 20.0–50.0)
Transferrin: 250 mg/dL (ref 212.0–360.0)

## 2017-06-19 LAB — CBC WITH DIFFERENTIAL/PLATELET
Basophils Absolute: 0.1 10*3/uL (ref 0.0–0.1)
Basophils Relative: 1.1 % (ref 0.0–3.0)
EOS PCT: 1.9 % (ref 0.0–5.0)
Eosinophils Absolute: 0.1 10*3/uL (ref 0.0–0.7)
HCT: 46.2 % (ref 39.0–52.0)
Hemoglobin: 15.3 g/dL (ref 13.0–17.0)
Lymphocytes Relative: 31.2 % (ref 12.0–46.0)
Lymphs Abs: 1.4 10*3/uL (ref 0.7–4.0)
MCHC: 33.1 g/dL (ref 30.0–36.0)
MCV: 79.2 fl (ref 78.0–100.0)
MONOS PCT: 9.4 % (ref 3.0–12.0)
Monocytes Absolute: 0.4 10*3/uL (ref 0.1–1.0)
Neutro Abs: 2.6 10*3/uL (ref 1.4–7.7)
Neutrophils Relative %: 56.4 % (ref 43.0–77.0)
Platelets: 221 10*3/uL (ref 150.0–400.0)
RBC: 5.83 Mil/uL — AB (ref 4.22–5.81)
RDW: 14.5 % (ref 11.5–15.5)
WBC: 4.6 10*3/uL (ref 4.0–10.5)

## 2017-06-19 LAB — LIPID PANEL
Cholesterol: 177 mg/dL (ref 0–200)
HDL: 45.1 mg/dL (ref >39.00)
LDL Cholesterol: 94 mg/dL (ref 0–99)
NONHDL: 132.18
Total CHOL/HDL Ratio: 4
Triglycerides: 191 mg/dL — ABNORMAL HIGH (ref 0.0–149.0)
VLDL: 38.2 mg/dL (ref 0.0–40.0)

## 2017-06-19 LAB — MICROALBUMIN / CREATININE URINE RATIO
Creatinine,U: 60.4 mg/dL
Microalb Creat Ratio: 1.2 mg/g (ref 0.0–30.0)

## 2017-06-19 LAB — POCT GLYCOSYLATED HEMOGLOBIN (HGB A1C): HEMOGLOBIN A1C: 12.9

## 2017-06-19 MED ORDER — CEFDINIR 300 MG PO CAPS: 300.0000 mg | ORAL_CAPSULE | Freq: Two times a day (BID) | ORAL | 0 refills | Status: AC

## 2017-06-19 MED ORDER — OLMESARTAN-AMLODIPINE-HCTZ 40-5-12.5 MG PO TABS: 1.0000 | ORAL_TABLET | Freq: Every day | ORAL | 1 refills | Status: DC

## 2017-06-19 MED ORDER — HYDROCODONE-HOMATROPINE 5-1.5 MG/5ML PO SYRP: 5.0000 mL | ORAL_SOLUTION | Freq: Three times a day (TID) | ORAL | 0 refills | Status: AC | PRN

## 2017-06-19 MED ORDER — INSULIN DEGLUDEC 100 UNIT/ML ~~LOC~~ SOPN: 80.0000 [IU] | PEN_INJECTOR | SUBCUTANEOUS | 11 refills | Status: DC

## 2017-06-19 NOTE — Progress Notes (Signed)
Subjective:  Patient ID: Mark Oconnor, male    DOB: 1980/06/15  Age: 37 y.o. MRN: [id number]  CC: Hypertension; Hyperlipidemia; Diabetes; and Sinusitis   HPI Mark Oconnor presents for f/up - He complains of a 3-week history of facial pain, runny nose producing thick yellow-green phlegm, nonproductive cough, and chills.  He denies fever, lymphadenopathy, rash, shortness of breath, wheezing, or night sweats.  He also complains that his blood sugars have not been well controlled.  He has not recently been compliant with the oral meds or insulin.  He tells me his blood pressure has been well controlled.  He has had no recent episodes of headache/blurred vision/chest pain/shortness of breath/palpitations/edema/fatigue.  Outpatient Medications Prior to Visit  Medication Sig Dispense Refill  . B-D UF III MINI PEN NEEDLES 31G X 5 MM MISC USE DAILY AS DIRECTED DX CODE E11.8 100 each 11  . canagliflozin (INVOKANA) 300 MG TABS tablet Take 1 tablet (300 mg total) daily by mouth. 90 tablet 1  . cyanocobalamin 2000 MCG tablet Take 1 tablet (2,000 mcg total) by mouth daily. 90 tablet 3  . glucose blood (ONE TOUCH ULTRA TEST) test strip USE TO TEST BLOOD SUGAR TWICE DAILY    . ONE TOUCH ULTRA TEST test strip USE TO TEST BLOOD SUGAR TWICE DAILY 100 each 11  . sitaGLIPtin-metformin (JANUMET) 50-1000 MG tablet Take 1 tablet 2 (two) times daily with a meal by mouth. 180 tablet 3  . VIAGRA 100 MG tablet TAKE 1/2-1 BY MOUTH DAILY AS NEEDED FOR ERECTILE DYSFUNCTION 10 tablet 1  . Cholecalciferol 2000 units TABS Take 1 tablet (2,000 Units total) by mouth daily. 90 tablet 3  . insulin degludec (TRESIBA FLEXTOUCH) 100 UNIT/ML SOPN FlexTouch Pen Inject 0.45 mLs (45 Units total) every morning into the skin. And pen needles 1/day 15 pen 11  . Olmesartan-Amlodipine-HCTZ 40-5-12.5 MG TABS Take 1 tablet by mouth daily. 90 tablet 1  . albuterol (PROVENTIL HFA;VENTOLIN HFA) 108 (90 BASE) MCG/ACT inhaler Inhale 2  puffs into the lungs every 6 (six) hours as needed for wheezing or shortness of breath.     No facility-administered medications prior to visit.     ROS Review of Systems  Constitutional: Positive for chills. Negative for fatigue and fever.  HENT: Positive for postnasal drip, rhinorrhea, sinus pressure and sinus pain. Negative for congestion, facial swelling, sore throat and trouble swallowing.   Eyes: Negative.   Respiratory: Positive for cough. Negative for chest tightness, shortness of breath and wheezing.   Cardiovascular: Negative for chest pain, palpitations and leg swelling.  Gastrointestinal: Negative.  Negative for abdominal pain, constipation, diarrhea, nausea and vomiting.  Endocrine: Negative.  Negative for polydipsia, polyphagia and polyuria.  Genitourinary: Negative.  Negative for difficulty urinating.  Musculoskeletal: Negative.  Negative for arthralgias and myalgias.  Skin: Negative.  Negative for color change and rash.  Allergic/Immunologic: Negative.   Neurological: Negative.  Negative for dizziness, weakness and light-headedness.  Hematological: Negative for adenopathy. Does not bruise/bleed easily.  Psychiatric/Behavioral: Negative.     Objective:  BP 120/80 (BP Location: Right Arm, Patient Position: Sitting, Cuff Size: Large)   Pulse 65   Temp 98.5 F (36.9 C) (Oral)   Resp 16   Ht 6\' 3"  (1.905 m)   Wt 256 lb 1.3 oz (116.2 kg)   SpO2 99%   BMI 32.01 kg/m   BP Readings from Last 3 Encounters:  06/19/17 120/80  03/16/17 120/82  01/11/17 116/72    Wt Readings from Last  3 Encounters:  06/19/17 256 lb 1.3 oz (116.2 kg)  03/16/17 247 lb 3.2 oz (112.1 kg)  01/11/17 245 lb 12.8 oz (111.5 kg)    Physical Exam  Constitutional: He is oriented to person, place, and time. No distress.  HENT:  Mouth/Throat: Oropharynx is clear and moist. No oropharyngeal exudate.  Eyes: Conjunctivae are normal. Left eye exhibits no discharge. No scleral icterus.  Neck:  Normal range of motion. Neck supple. No JVD present. No thyromegaly present.  Cardiovascular: Normal rate, regular rhythm and normal heart sounds. Exam reveals no gallop.  No murmur heard. Pulmonary/Chest: Effort normal and breath sounds normal. No respiratory distress. He has no wheezes. He has no rales.  Abdominal: Soft. Bowel sounds are normal. He exhibits no distension and no mass. There is no tenderness.  Musculoskeletal: Normal range of motion. He exhibits no edema, tenderness or deformity.  Lymphadenopathy:    He has no cervical adenopathy.  Neurological: He is alert and oriented to person, place, and time.  Skin: Skin is warm and dry. No rash noted. He is not diaphoretic. No erythema. No pallor.  Vitals reviewed.   Lab Results  Component Value Date   WBC 4.6 06/19/2017   HGB 15.3 06/19/2017   HCT 46.2 06/19/2017   PLT 221.0 06/19/2017   GLUCOSE 256 (H) 06/19/2017   CHOL 177 06/19/2017   TRIG 191.0 (H) 06/19/2017   HDL 45.10 06/19/2017   LDLDIRECT 130.0 07/09/2014   LDLCALC 94 06/19/2017   ALT 20 06/19/2017   AST 14 06/19/2017   NA 138 06/19/2017   K 4.0 06/19/2017   CL 102 06/19/2017   CREATININE 1.03 06/19/2017   BUN 12 06/19/2017   CO2 31 06/19/2017   TSH 2.63 06/19/2017   PSA 0.53 04/26/2011   HGBA1C 12.9 06/19/2017   MICROALBUR <0.7 06/19/2017    No results found.  Assessment & Plan:   Mark Oconnor was seen today for hypertension, hyperlipidemia, diabetes and sinusitis.  Diagnoses and all orders for this visit:  Essential hypertension, benign- His blood pressure is adequately well controlled.  Electrolytes and renal function are normal. -     Comprehensive metabolic panel; Future -     CBC with Differential/Platelet; Future -     TSH; Future -     Urinalysis, Routine w reflex microscopic; Future -     Olmesartan-Amlodipine-HCTZ 40-5-12.5 MG TABS; Take 1 tablet by mouth daily.  Type 2 diabetes mellitus with complication, with long-term current use of  insulin (HCC)- His A1c is up to 12.9%.  I have asked him to restart his oral meds and basal insulin.  I have asked him to see endocrinology and diabetic education. -     Microalbumin / creatinine urine ratio; Future -     Amb Referral to Nutrition and Diabetic E -     Ambulatory referral to Endocrinology -     Consult to [facility] Management -     insulin degludec (TRESIBA FLEXTOUCH) 100 UNIT/ML SOPN FlexTouch Pen; Inject 0.8 mLs (80 Units total) into the skin every morning. And pen needles 1/day -     POCT glycosylated hemoglobin (Hb A1C) -     Olmesartan-Amlodipine-HCTZ 40-5-12.5 MG TABS; Take 1 tablet by mouth daily.  B12 deficiency- His B12 level is borderline low at 276.  I have asked him to come in for monthly B12 injections. -     CBC with Differential/Platelet; Future -     Folate; Future -     Vitamin B12; Future  Bariatric surgery status- I will monitor him for vitamin deficiencies. -     IBC panel; Future -     Ferritin; Future -     Vitamin B1; Future -     Zinc; Future -     Cholecalciferol 2000 units TABS; Take 1 tablet (2,000 Units total) by mouth daily.  Hyperlipidemia with target LDL less than 100- His LDL is high but he is less than 40 yoa and does not want to take a statin.  Will defer statin therapy at this time. -     Lipid panel; Future -     TSH; Future  Pure hyperglyceridemia- improvement noted -     Lipid panel; Future  Vitamin D deficiency -     VITAMIN D 25 Hydroxy (Vit-D Deficiency, Fractures); Future -     Cholecalciferol 2000 units TABS; Take 1 tablet (2,000 Units total) by mouth daily.  Acute non-recurrent maxillary sinusitis -     cefdinir (OMNICEF) 300 MG capsule; Take 1 capsule (300 mg total) by mouth 2 (two) times daily for 10 days. -     HYDROcodone-homatropine (HYCODAN) 5-1.5 MG/5ML syrup; Take 5 mLs by mouth every 8 (eight) hours as needed for up to 7 days for cough.   I have changed Christo C. Bresnan's insulin degludec. I am also having him  start on cefdinir and HYDROcodone-homatropine. Additionally, I am having him maintain his albuterol, glucose blood, ONE TOUCH ULTRA TEST, cyanocobalamin, VIAGRA, B-D UF III MINI PEN NEEDLES, canagliflozin, sitaGLIPtin-metformin, Olmesartan-Amlodipine-HCTZ, and Cholecalciferol.  Meds ordered this encounter  Medications  . cefdinir (OMNICEF) 300 MG capsule    Sig: Take 1 capsule (300 mg total) by mouth 2 (two) times daily for 10 days.    Dispense:  20 capsule    Refill:  0  . HYDROcodone-homatropine (HYCODAN) 5-1.5 MG/5ML syrup    Sig: Take 5 mLs by mouth every 8 (eight) hours as needed for up to 7 days for cough.    Dispense:  120 mL    Refill:  0  . insulin degludec (TRESIBA FLEXTOUCH) 100 UNIT/ML SOPN FlexTouch Pen    Sig: Inject 0.8 mLs (80 Units total) into the skin every morning. And pen needles 1/day    Dispense:  15 pen    Refill:  11  . Olmesartan-Amlodipine-HCTZ 40-5-12.5 MG TABS    Sig: Take 1 tablet by mouth daily.    Dispense:  90 tablet    Refill:  1  . Cholecalciferol 2000 units TABS    Sig: Take 1 tablet (2,000 Units total) by mouth daily.    Dispense:  90 tablet    Refill:  3     Follow-up: Return in about 3 months (around 09/16/2017).  Sanda Lingerhomas Dereck Agerton, MD

## 2017-06-19 NOTE — Patient Instructions (Signed)

## 2017-06-20 LAB — VITAMIN D 25 HYDROXY (VIT D DEFICIENCY, FRACTURES): VITD: 13.12 ng/mL — ABNORMAL LOW (ref 30.00–100.00)

## 2017-06-20 LAB — FERRITIN: FERRITIN: 71.6 ng/mL (ref 22.0–322.0)

## 2017-06-20 LAB — FOLATE: Folate: 13.5 ng/mL (ref >5.9)

## 2017-06-20 LAB — TSH: TSH: 2.63 u[IU]/mL (ref 0.35–4.50)

## 2017-06-20 LAB — VITAMIN B12: Vitamin B-12: 276 pg/mL (ref 211–911)

## 2017-06-20 MED ORDER — CHOLECALCIFEROL 50 MCG (2000 UT) PO TABS: 1.0000 | ORAL_TABLET | Freq: Every day | ORAL | 3 refills | Status: DC

## 2017-06-24 ENCOUNTER — Other Ambulatory Visit: Payer: Self-pay | Admitting: Internal Medicine

## 2017-06-24 ENCOUNTER — Encounter: Payer: Self-pay | Admitting: Internal Medicine

## 2017-06-24 DIAGNOSIS — E60 Dietary zinc deficiency: Secondary | ICD-10-CM

## 2017-06-24 LAB — ZINC: Zinc: 51 ug/dL — ABNORMAL LOW (ref 60–130)

## 2017-06-24 LAB — VITAMIN B1: VITAMIN B1 (THIAMINE): 13 nmol/L (ref 8–30)

## 2017-06-24 MED ORDER — ZINC GLUCONATE 50 MG PO TABS: 50.0000 mg | ORAL_TABLET | Freq: Every day | ORAL | 1 refills | Status: AC

## 2017-06-25 ENCOUNTER — Ambulatory Visit (INDEPENDENT_AMBULATORY_CARE_PROVIDER_SITE_OTHER): Payer: BLUE CROSS/BLUE SHIELD

## 2017-06-25 DIAGNOSIS — E538 Deficiency of other specified B group vitamins: Secondary | ICD-10-CM | POA: Diagnosis not present

## 2017-06-25 MED ORDER — CYANOCOBALAMIN 1000 MCG/ML IJ SOLN
1000.0000 ug | Freq: Once | INTRAMUSCULAR | Status: AC
Start: 2017-06-25 — End: 2017-06-25
  Administered 2017-06-25: 1000 ug via INTRAMUSCULAR

## 2017-06-30 ENCOUNTER — Other Ambulatory Visit: Payer: Self-pay | Admitting: Internal Medicine

## 2017-06-30 DIAGNOSIS — Z794 Long term (current) use of insulin: Secondary | ICD-10-CM

## 2017-06-30 DIAGNOSIS — E118 Type 2 diabetes mellitus with unspecified complications: Secondary | ICD-10-CM

## 2017-07-11 ENCOUNTER — Ambulatory Visit: Payer: BLUE CROSS/BLUE SHIELD | Admitting: Endocrinology

## 2017-07-25 ENCOUNTER — Encounter: Payer: Self-pay | Admitting: Endocrinology

## 2017-07-25 ENCOUNTER — Ambulatory Visit (INDEPENDENT_AMBULATORY_CARE_PROVIDER_SITE_OTHER): Payer: BLUE CROSS/BLUE SHIELD

## 2017-07-25 ENCOUNTER — Ambulatory Visit: Payer: BLUE CROSS/BLUE SHIELD | Admitting: Endocrinology

## 2017-07-25 DIAGNOSIS — E118 Type 2 diabetes mellitus with unspecified complications: Secondary | ICD-10-CM | POA: Diagnosis not present

## 2017-07-25 DIAGNOSIS — E538 Deficiency of other specified B group vitamins: Secondary | ICD-10-CM

## 2017-07-25 DIAGNOSIS — Z794 Long term (current) use of insulin: Secondary | ICD-10-CM

## 2017-07-25 MED ORDER — SITAGLIPTIN PHOS-METFORMIN HCL 50-1000 MG PO TABS: 1.0000 | ORAL_TABLET | Freq: Two times a day (BID) | ORAL | 3 refills | Status: DC

## 2017-07-25 MED ORDER — CYANOCOBALAMIN 1000 MCG/ML IJ SOLN
1000.0000 ug | Freq: Once | INTRAMUSCULAR | Status: AC
Start: 2017-07-25 — End: 2017-07-25
  Administered 2017-07-25: 1000 ug via INTRAMUSCULAR

## 2017-07-25 MED ORDER — GLUCOSE BLOOD VI STRP: 1.0000 | ORAL_STRIP | Freq: Two times a day (BID) | 3 refills | Status: DC

## 2017-07-25 NOTE — Patient Instructions (Addendum)
I have sent a prescription to your pharmacy, to resume the janumet.   Please continue the same other diabetes medications check your blood sugar twice a day.  vary the time of day when you check, between before the 3 meals, and at bedtime.  also check if you have symptoms of your blood sugar being too high or too low.  please keep a record of the readings and bring it to your next appointment here (or you can bring the meter itself).  You can write it on any piece of paper.  please call us sooner if your blood sugar goes below 70, or if you have a lot of readings over 200.   Please come back for a follow-up appointment in 2 months.

## 2017-07-25 NOTE — Progress Notes (Signed)
Subjective:    Patient ID: Mark Oconnor, male    DOB: [birth year], 37 y.o.   MRN: [id number]  HPI Pt returns for f/u of diabetes mellitus: DM type: Insulin-requiring type 2 Dx'ed: 2011 Complications: none Therapy: insulin since dx, and 3 oral meds.   DKA: never Severe hypoglycemia: never.  Pancreatitis: never.   Other: he had gastric bypass surgery in 2015--he has lost approx 90 lbs since then, but pt feels this precipitated excessive alcohol intake; he declines multiple daily injections.   Interval history: Pt says he was missing the insulin prior to the 2/19 a1c, but has been taking as rx'ed since then.  no cbg record, but states cbg's are in the mid-100's.  He takes invokana, but not the janumet.   Past Medical History:  Diagnosis Date  . Allergy   . Diabetes mellitus   . GERD (gastroesophageal reflux disease)   . Hyperlipidemia   . Hypertension   . Morbid obesity (HCC)   . Sleep apnea 05-21-13   denies.Stop/Bang score =4 with PAT visit    Past Surgical History:  Procedure Laterality Date  . BREATH TEK H PYLORI N/A 07/12/2012   Procedure: BREATH TEK H PYLORI;  Surgeon: Mariella Saa, MD;  Location: [facility];  Service: General;  Laterality: N/A;  . GASTRIC ROUX-EN-Y N/A 05/26/2013   Procedure: LAPAROSCOPIC ROUX-EN-Y GASTRIC BYPASS WITH UPPER ENDOSCOPY;  Surgeon: Mariella Saa, MD;  Location: [facility];  Service: General;  Laterality: N/A;    Social History   Socioeconomic History  . Marital status: Married    Spouse name: Not on file  . Number of children: Not on file  . Years of education: Not on file  . Highest education level: Not on file  Occupational History  . Not on file  Social Needs  . Financial resource strain: Not on file  . Food insecurity:    Worry: Not on file    Inability: Not on file  . Transportation needs:    Medical: Not on file    Non-medical: Not on file  Tobacco Use  . Smoking status: Former Smoker    Packs/day: 0.50   Years: 15.00    Pack years: 7.50    Types: Cigarettes    Last attempt to quit: 12/07/2011    Years since quitting: 5.6  . Smokeless tobacco: Never Used  Substance and Sexual Activity  . Alcohol use: Yes    Alcohol/week: 6.0 oz    Types: 10 Cans of beer per week    Comment: occasional to rare  . Drug use: No  . Sexual activity: Yes  Lifestyle  . Physical activity:    Days per week: Not on file    Minutes per session: Not on file  . Stress: Not on file  Relationships  . Social connections:    Talks on phone: Not on file    Gets together: Not on file    Attends religious service: Not on file    Active member of club or organization: Not on file    Attends meetings of clubs or organizations: Not on file    Relationship status: Not on file  . Intimate partner violence:    Fear of current or ex partner: Not on file    Emotionally abused: Not on file    Physically abused: Not on file    Forced sexual activity: Not on file  Other Topics Concern  . Not on file  Social History Narrative  .  Not on file    Current Outpatient Medications on File Prior to Visit  Medication Sig Dispense Refill  . B-D UF III MINI PEN NEEDLES 31G X 5 MM MISC USE DAILY AS DIRECTED DX CODE E11.8 100 each 11  . Cholecalciferol 2000 units TABS Take 1 tablet (2,000 Units total) by mouth daily. 90 tablet 3  . glucose blood (ONE TOUCH ULTRA TEST) test strip USE TO TEST BLOOD SUGAR TWICE DAILY    . insulin degludec (TRESIBA FLEXTOUCH) 100 UNIT/ML SOPN FlexTouch Pen Inject 0.8 mLs (80 Units total) into the skin every morning. And pen needles 1/day 15 pen 11  . INVOKANA 300 MG TABS tablet TAKE 1 TABLET (300 MG TOTAL) BY MOUTH DAILY. 90 tablet 0  . Olmesartan-Amlodipine-HCTZ 40-5-12.5 MG TABS Take 1 tablet by mouth daily. 90 tablet 1  . ONE TOUCH ULTRA TEST test strip USE TO TEST BLOOD SUGAR TWICE DAILY 100 each 11  . VIAGRA 100 MG tablet TAKE 1/2-1 BY MOUTH DAILY AS NEEDED FOR ERECTILE DYSFUNCTION 10 tablet 1  .  zinc gluconate 50 MG tablet Take 1 tablet (50 mg total) by mouth daily. 90 tablet 1  . albuterol (PROVENTIL HFA;VENTOLIN HFA) 108 (90 BASE) MCG/ACT inhaler Inhale 2 puffs into the lungs every 6 (six) hours as needed for wheezing or shortness of breath.     No current facility-administered medications on file prior to visit.     Allergies  Allergen Reactions  . Other Itching    Tree nuts     Family History  Problem Relation Age of Onset  . Diabetes Father   . Cancer Neg Hx   . Heart disease Neg Hx   . Hyperlipidemia Neg Hx   . Hypertension Neg Hx   . Diabetes Mother   . Diabetes Brother        one brother  diabetic    BP 122/84   Pulse 84   Ht 6\' 3"  (1.905 m)   Wt 260 lb (117.9 kg)   SpO2 98%   BMI 32.50 kg/m    Review of Systems He denies hypoglycemia.  He reports weight gain.      Objective:   Physical Exam VITAL SIGNS:  See vs page GENERAL: no distress Pulses: dorsalis pedis intact bilat.   MSK: no deformity of the feet CV: no leg edema Skin:  no ulcer on the feet.  normal color and temp on the feet. Neuro: sensation is intact to touch on the feet   Lab Results  Component Value Date   HGBA1C 12.9 06/19/2017       Assessment & Plan:  Insulin-requiring type 2 DM: worse. Noncompliance with cbg recording and medication.  We discussed risks.     Patient Instructions  I have sent a prescription to your pharmacy, to resume the janumet.   Please continue the same other diabetes medications check your blood sugar twice a day.  vary the time of day when you check, between before the 3 meals, and at bedtime.  also check if you have symptoms of your blood sugar being too high or too low.  please keep a record of the readings and bring it to your next appointment here (or you can bring the meter itself).  You can write it on any piece of paper.  please call us sooner if your blood sugar goes below 70, or if you have a lot of readings over 200.   Please come back for  a follow-up appointment in 2  months.

## 2017-08-28 DIAGNOSIS — H538 Other visual disturbances: Secondary | ICD-10-CM | POA: Diagnosis not present

## 2017-08-28 DIAGNOSIS — R7309 Other abnormal glucose: Secondary | ICD-10-CM | POA: Diagnosis not present

## 2017-08-28 DIAGNOSIS — E08649 Diabetes mellitus due to underlying condition with hypoglycemia without coma: Secondary | ICD-10-CM | POA: Diagnosis not present

## 2017-09-01 ENCOUNTER — Other Ambulatory Visit: Payer: Self-pay | Admitting: Internal Medicine

## 2017-09-01 DIAGNOSIS — N5201 Erectile dysfunction due to arterial insufficiency: Secondary | ICD-10-CM

## 2017-09-24 ENCOUNTER — Ambulatory Visit: Payer: BLUE CROSS/BLUE SHIELD | Admitting: Endocrinology

## 2017-09-27 DIAGNOSIS — R590 Localized enlarged lymph nodes: Secondary | ICD-10-CM | POA: Diagnosis not present

## 2017-09-27 DIAGNOSIS — J019 Acute sinusitis, unspecified: Secondary | ICD-10-CM | POA: Diagnosis not present

## 2017-09-27 DIAGNOSIS — J029 Acute pharyngitis, unspecified: Secondary | ICD-10-CM | POA: Diagnosis not present

## 2017-10-11 ENCOUNTER — Ambulatory Visit: Payer: BLUE CROSS/BLUE SHIELD | Admitting: Endocrinology

## 2017-10-11 DIAGNOSIS — Z0289 Encounter for other administrative examinations: Secondary | ICD-10-CM

## 2017-11-26 ENCOUNTER — Other Ambulatory Visit: Payer: Self-pay | Admitting: Internal Medicine

## 2017-11-26 ENCOUNTER — Telehealth: Payer: Self-pay

## 2017-11-26 DIAGNOSIS — N5201 Erectile dysfunction due to arterial insufficiency: Secondary | ICD-10-CM

## 2017-11-26 MED ORDER — VIAGRA 100 MG PO TABS: ORAL_TABLET | ORAL | 1 refills | Status: DC

## 2017-11-26 NOTE — Telephone Encounter (Signed)
[facility] requesting rx for sildenafil 100 mg tabs. Please advise.

## 2017-11-27 ENCOUNTER — Other Ambulatory Visit: Payer: Self-pay | Admitting: Internal Medicine

## 2017-11-27 DIAGNOSIS — N5201 Erectile dysfunction due to arterial insufficiency: Secondary | ICD-10-CM

## 2017-12-17 ENCOUNTER — Other Ambulatory Visit: Payer: Self-pay | Admitting: Internal Medicine

## 2017-12-17 DIAGNOSIS — Z794 Long term (current) use of insulin: Secondary | ICD-10-CM

## 2017-12-17 DIAGNOSIS — I10 Essential (primary) hypertension: Secondary | ICD-10-CM

## 2017-12-17 DIAGNOSIS — E118 Type 2 diabetes mellitus with unspecified complications: Secondary | ICD-10-CM

## 2018-02-19 ENCOUNTER — Other Ambulatory Visit: Payer: Self-pay | Admitting: Internal Medicine

## 2018-02-19 DIAGNOSIS — N5201 Erectile dysfunction due to arterial insufficiency: Secondary | ICD-10-CM

## 2018-02-19 MED ORDER — SILDENAFIL CITRATE 100 MG PO TABS: ORAL_TABLET | ORAL | 1 refills | Status: DC

## 2018-04-28 ENCOUNTER — Other Ambulatory Visit: Payer: Self-pay | Admitting: Endocrinology

## 2018-04-28 DIAGNOSIS — E118 Type 2 diabetes mellitus with unspecified complications: Secondary | ICD-10-CM

## 2018-04-28 DIAGNOSIS — Z794 Long term (current) use of insulin: Secondary | ICD-10-CM

## 2018-04-28 NOTE — Telephone Encounter (Signed)
Please refill x 1 Ov is due  

## 2018-05-28 DIAGNOSIS — J029 Acute pharyngitis, unspecified: Secondary | ICD-10-CM | POA: Diagnosis not present

## 2018-05-30 ENCOUNTER — Ambulatory Visit (INDEPENDENT_AMBULATORY_CARE_PROVIDER_SITE_OTHER): Payer: BLUE CROSS/BLUE SHIELD | Admitting: Internal Medicine

## 2018-05-30 ENCOUNTER — Encounter: Payer: Self-pay | Admitting: Internal Medicine

## 2018-05-30 ENCOUNTER — Other Ambulatory Visit (INDEPENDENT_AMBULATORY_CARE_PROVIDER_SITE_OTHER): Payer: BLUE CROSS/BLUE SHIELD

## 2018-05-30 VITALS — BP 128/86 | HR 86 | Temp 98.0°F | Resp 16 | Ht 75.0 in | Wt 255.0 lb

## 2018-05-30 DIAGNOSIS — I10 Essential (primary) hypertension: Secondary | ICD-10-CM

## 2018-05-30 DIAGNOSIS — E60 Dietary zinc deficiency: Secondary | ICD-10-CM

## 2018-05-30 DIAGNOSIS — E118 Type 2 diabetes mellitus with unspecified complications: Secondary | ICD-10-CM | POA: Diagnosis not present

## 2018-05-30 DIAGNOSIS — E781 Pure hyperglyceridemia: Secondary | ICD-10-CM

## 2018-05-30 DIAGNOSIS — Z0000 Encounter for general adult medical examination without abnormal findings: Secondary | ICD-10-CM

## 2018-05-30 DIAGNOSIS — E538 Deficiency of other specified B group vitamins: Secondary | ICD-10-CM

## 2018-05-30 DIAGNOSIS — E785 Hyperlipidemia, unspecified: Secondary | ICD-10-CM

## 2018-05-30 DIAGNOSIS — Z794 Long term (current) use of insulin: Secondary | ICD-10-CM

## 2018-05-30 DIAGNOSIS — Z9884 Bariatric surgery status: Secondary | ICD-10-CM

## 2018-05-30 DIAGNOSIS — J029 Acute pharyngitis, unspecified: Secondary | ICD-10-CM

## 2018-05-30 DIAGNOSIS — E559 Vitamin D deficiency, unspecified: Secondary | ICD-10-CM | POA: Diagnosis not present

## 2018-05-30 DIAGNOSIS — K219 Gastro-esophageal reflux disease without esophagitis: Secondary | ICD-10-CM

## 2018-05-30 LAB — COMPREHENSIVE METABOLIC PANEL
ALBUMIN: 4.7 g/dL (ref 3.5–5.2)
ALT: 21 U/L (ref 0–53)
AST: 19 U/L (ref 0–37)
Alkaline Phosphatase: 55 U/L (ref 39–117)
BUN: 12 mg/dL (ref 6–23)
CO2: 24 mEq/L (ref 19–32)
Calcium: 9.9 mg/dL (ref 8.4–10.5)
Chloride: 95 mEq/L — ABNORMAL LOW (ref 96–112)
Creatinine, Ser: 1.04 mg/dL (ref 0.40–1.50)
GFR: 96.77 mL/min (ref >60.00)
Glucose, Bld: 269 mg/dL — ABNORMAL HIGH (ref 70–99)
POTASSIUM: 3.9 meq/L (ref 3.5–5.1)
Sodium: 134 mEq/L — ABNORMAL LOW (ref 135–145)
Total Bilirubin: 0.7 mg/dL (ref 0.2–1.2)
Total Protein: 8 g/dL (ref 6.0–8.3)

## 2018-05-30 LAB — URINALYSIS, ROUTINE W REFLEX MICROSCOPIC
BILIRUBIN URINE: NEGATIVE
Hgb urine dipstick: NEGATIVE
Ketones, ur: >=80 — AB
Leukocytes, UA: NEGATIVE
Nitrite: NEGATIVE
Specific Gravity, Urine: <=1.005 — AB (ref 1.000–1.030)
Total Protein, Urine: NEGATIVE
UROBILINOGEN UA: 0.2 (ref 0.0–1.0)
Urine Glucose: >=1000 — AB
pH: 5.5 (ref 5.0–8.0)

## 2018-05-30 LAB — CBC WITH DIFFERENTIAL/PLATELET
Basophils Absolute: 0.2 10*3/uL — ABNORMAL HIGH (ref 0.0–0.1)
Basophils Relative: 3.4 % — ABNORMAL HIGH (ref 0.0–3.0)
Eosinophils Absolute: 0.1 10*3/uL (ref 0.0–0.7)
Eosinophils Relative: 1.3 % (ref 0.0–5.0)
HEMATOCRIT: 45.9 % (ref 39.0–52.0)
Hemoglobin: 15.1 g/dL (ref 13.0–17.0)
LYMPHS PCT: 19.6 % (ref 12.0–46.0)
Lymphs Abs: 1.4 10*3/uL (ref 0.7–4.0)
MCHC: 32.8 g/dL (ref 30.0–36.0)
MCV: 80.5 fl (ref 78.0–100.0)
Monocytes Absolute: 0.6 10*3/uL (ref 0.1–1.0)
Monocytes Relative: 8.4 % (ref 3.0–12.0)
Neutro Abs: 4.8 10*3/uL (ref 1.4–7.7)
Neutrophils Relative %: 67.3 % (ref 43.0–77.0)
Platelets: 226 10*3/uL (ref 150.0–400.0)
RBC: 5.7 Mil/uL (ref 4.22–5.81)
RDW: 13.5 % (ref 11.5–15.5)
WBC: 7.1 10*3/uL (ref 4.0–10.5)

## 2018-05-30 LAB — LIPID PANEL
Cholesterol: 265 mg/dL — ABNORMAL HIGH (ref 0–200)
HDL: 46.8 mg/dL (ref >39.00)
NonHDL: 218.5
Total CHOL/HDL Ratio: 6
Triglycerides: 346 mg/dL — ABNORMAL HIGH (ref 0.0–149.0)
VLDL: 69.2 mg/dL — ABNORMAL HIGH (ref 0.0–40.0)

## 2018-05-30 LAB — FERRITIN: Ferritin: 155 ng/mL (ref 22.0–322.0)

## 2018-05-30 LAB — MICROALBUMIN / CREATININE URINE RATIO
Creatinine,U: 35.5 mg/dL
Microalb Creat Ratio: 3 mg/g (ref 0.0–30.0)
Microalb, Ur: 1 mg/dL (ref 0.0–1.9)

## 2018-05-30 LAB — GLUCOSE, POCT (MANUAL RESULT ENTRY): POC GLUCOSE: 256 mg/dL — AB (ref 70–99)

## 2018-05-30 LAB — IBC PANEL
Iron: 60 ug/dL (ref 42–165)
SATURATION RATIOS: 17.8 % — AB (ref 20.0–50.0)
Transferrin: 241 mg/dL (ref 212.0–360.0)

## 2018-05-30 LAB — LDL CHOLESTEROL, DIRECT: Direct LDL: 146 mg/dL

## 2018-05-30 LAB — POCT GLYCOSYLATED HEMOGLOBIN (HGB A1C): Hemoglobin A1C: 13.8 % — AB (ref 4.0–5.6)

## 2018-05-30 LAB — TSH: TSH: 1.22 u[IU]/mL (ref 0.35–4.50)

## 2018-05-30 MED ORDER — CANAGLIFLOZIN 300 MG PO TABS: 300.0000 mg | ORAL_TABLET | Freq: Every day | ORAL | 0 refills | Status: DC

## 2018-05-30 MED ORDER — CYANOCOBALAMIN 1000 MCG/ML IJ SOLN
1000.0000 ug | Freq: Once | INTRAMUSCULAR | Status: AC
  Administered 2018-05-30: 1000 ug via INTRAMUSCULAR

## 2018-05-30 MED ORDER — INSULIN DEGLUDEC 200 UNIT/ML ~~LOC~~ SOPN: 50.0000 [IU] | PEN_INJECTOR | Freq: Every day | SUBCUTANEOUS | 1 refills | Status: DC

## 2018-05-30 MED ORDER — BENZOCAINE-MENTHOL 15-20 MG MT LOZG: 1.0000 | LOZENGE | Freq: Four times a day (QID) | OROMUCOSAL | 0 refills | Status: DC | PRN

## 2018-05-30 NOTE — Progress Notes (Signed)
Subjective:  Patient ID: Mark Oconnor, male    DOB: Oct 26, 1980  Age: 38 y.o. MRN: [id number]  CC: Annual Exam; Hypertension; Hyperlipidemia; Diabetes; and Sore Throat   HPI Mark Oconnor presents for a CPX  He complains of a 5 day hx of sore throat. He was seen at an [facility] and he tells me that a RSS was negative. He is not taking anything to control the sx's.  He does not monitor his blood sugar. He is taking the SGLT-2 inh but not the other meds for blood sugar control.  Outpatient Medications Prior to Visit  Medication Sig Dispense Refill  . B-D UF III MINI PEN NEEDLES 31G X 5 MM MISC USE DAILY AS DIRECTED DX CODE E11.8 100 each 11  . glucose blood (ONE TOUCH ULTRA TEST) test strip USE TO TEST BLOOD SUGAR TWICE DAILY    . glucose blood (ONETOUCH VERIO) test strip 1 each by Other route 2 (two) times daily. And lancets 2/day 100 each 3  . Olmesartan-amLODIPine-HCTZ 40-5-12.5 MG TABS TAKE 1 TABLET BY MOUTH EVERY DAY 90 tablet 0  . ONE TOUCH ULTRA TEST test strip USE TO TEST BLOOD SUGAR TWICE DAILY 100 each 11  . sildenafil (VIAGRA) 100 MG tablet TAKE ONE-HALF (1/2) TO ONE TABLET DAILY AS NEEDED FOR ERECTILE DYSFUNCTION 24 tablet 1  . zinc gluconate 50 MG tablet Take 1 tablet (50 mg total) by mouth daily. 90 tablet 1  . Cholecalciferol 2000 units TABS Take 1 tablet (2,000 Units total) by mouth daily. 90 tablet 3  . insulin degludec (TRESIBA FLEXTOUCH) 100 UNIT/ML SOPN FlexTouch Pen Inject 0.8 mLs (80 Units total) into the skin every morning. And pen needles 1/day 15 pen 11  . INVOKANA 300 MG TABS tablet TAKE 1 TABLET BY MOUTH EVERY DAY 30 tablet 0  . sitaGLIPtin-metformin (JANUMET) 50-1000 MG tablet Take 1 tablet by mouth 2 (two) times daily with a meal. 180 tablet 3  . albuterol (PROVENTIL HFA;VENTOLIN HFA) 108 (90 BASE) MCG/ACT inhaler Inhale 2 puffs into the lungs every 6 (six) hours as needed for wheezing or shortness of breath.     No facility-administered medications prior to  visit.     ROS Review of Systems  Constitutional: Positive for fatigue and unexpected weight change (wt loss). Negative for chills and fever.  HENT: Positive for sore throat. Negative for facial swelling, sinus pressure, trouble swallowing and voice change.   Respiratory: Negative.  Negative for cough, shortness of breath and wheezing.   Cardiovascular: Negative for chest pain, palpitations and leg swelling.  Gastrointestinal: Negative for abdominal pain, constipation, diarrhea, nausea and vomiting.  Endocrine: Positive for polydipsia, polyphagia and polyuria.  Genitourinary: Negative.  Negative for difficulty urinating, dysuria, penile swelling, scrotal swelling, testicular pain and urgency.  Musculoskeletal: Negative.  Negative for arthralgias and myalgias.  Skin: Negative for color change, pallor and rash.  Neurological: Negative for dizziness, weakness and headaches.  Hematological: Negative for adenopathy. Does not bruise/bleed easily.  Psychiatric/Behavioral: Negative.     Objective:  BP 128/86 (BP Location: Left Arm, Patient Position: Sitting, Cuff Size: Large)   Pulse 86   Temp 98 F (36.7 C) (Oral)   Resp 16   Ht 6\' 3"  (1.905 m)   Wt 255 lb (115.7 kg)   SpO2 97%   BMI 31.87 kg/m   BP Readings from Last 3 Encounters:  05/30/18 128/86  07/25/17 122/84  06/19/17 120/80    Wt Readings from Last 3 Encounters:  05/30/18 255  lb (115.7 kg)  07/25/17 260 lb (117.9 kg)  06/19/17 256 lb 1.3 oz (116.2 kg)    Physical Exam Vitals signs reviewed.  Constitutional:      General: He is not in acute distress.    Appearance: He is obese. He is not ill-appearing, toxic-appearing or diaphoretic.  HENT:     Right Ear: Tympanic membrane normal.     Left Ear: Tympanic membrane normal.     Mouth/Throat:     Mouth: Mucous membranes are moist. No oral lesions.     Palate: No mass and lesions.     Pharynx: Uvula midline. Posterior oropharyngeal erythema present. No pharyngeal  swelling, oropharyngeal exudate or uvula swelling.     Tonsils: No tonsillar exudate or tonsillar abscesses.  Eyes:     General: No scleral icterus.    Conjunctiva/sclera: Conjunctivae normal.  Neck:     Musculoskeletal: Normal range of motion and neck supple. No neck rigidity or muscular tenderness.  Cardiovascular:     Rate and Rhythm: Normal rate and regular rhythm.     Pulses: Normal pulses.     Heart sounds: No murmur. No friction rub. No gallop.   Pulmonary:     Effort: Pulmonary effort is normal.     Breath sounds: No stridor. No wheezing, rhonchi or rales.  Abdominal:     General: Abdomen is flat.     Palpations: There is no hepatomegaly, splenomegaly or mass.     Tenderness: There is no abdominal tenderness.  Musculoskeletal: Normal range of motion.        General: No swelling.     Right lower leg: No edema.     Left lower leg: No edema.  Lymphadenopathy:     Head:     Right side of head: No submandibular, preauricular, posterior auricular or occipital adenopathy.     Left side of head: No submandibular, preauricular, posterior auricular or occipital adenopathy.     Cervical: No cervical adenopathy.     Right cervical: No superficial, deep or posterior cervical adenopathy.    Left cervical: No superficial, deep or posterior cervical adenopathy.     Upper Body:     Right upper body: No supraclavicular, axillary, pectoral or epitrochlear adenopathy.     Left upper body: No supraclavicular, axillary, pectoral or epitrochlear adenopathy.  Skin:    General: Skin is warm and dry.     Findings: No erythema or rash.  Neurological:     General: No focal deficit present.  Psychiatric:        Mood and Affect: Mood normal.        Behavior: Behavior normal.        Thought Content: Thought content normal.        Judgment: Judgment normal.     Lab Results  Component Value Date   WBC 7.1 05/30/2018   HGB 15.1 05/30/2018   HCT 45.9 05/30/2018   PLT 226.0 05/30/2018    GLUCOSE 269 (H) 05/30/2018   CHOL 265 (H) 05/30/2018   TRIG 346.0 (H) 05/30/2018   HDL 46.80 05/30/2018   LDLDIRECT 146.0 05/30/2018   LDLCALC 94 06/19/2017   ALT 21 05/30/2018   AST 19 05/30/2018   NA 134 (L) 05/30/2018   K 3.9 05/30/2018   CL 95 (L) 05/30/2018   CREATININE 1.04 05/30/2018   BUN 12 05/30/2018   CO2 24 05/30/2018   TSH 1.22 05/30/2018   PSA 0.53 04/26/2011   HGBA1C 13.8 (A) 05/30/2018   MICROALBUR 1.0  05/30/2018    No results found.  Assessment & Plan:   Andrue was seen today for annual exam, hypertension, hyperlipidemia, diabetes and sore throat.  Diagnoses and all orders for this visit:  Type II diabetes mellitus with manifestations (HCC)- His blood sugars are not adequately well controlled and he has symptoms of glucose toxicity in addition to ketones in his urine and hyponatremia.  I have asked him to restart basal insulin and I gave him a dose of Tresiba in the office today and gave him samples of [place].  He will stay on Invokana and I have asked him to restart Janumet as well. -     Comprehensive metabolic panel; Future -     Microalbumin / creatinine urine ratio; Future -     HM Diabetes Foot Exam -     POCT Glucose (CBG) -     POCT HgB A1C -     Amb Referral to Nutrition and Diabetic E -     Ambulatory referral to Endocrinology -     Insulin Degludec (TRESIBA FLEXTOUCH) 200 UNIT/ML SOPN; Inject 50 Units into the skin daily. -     canagliflozin (INVOKANA) 300 MG TABS tablet; Take 1 tablet (300 mg total) by mouth daily. -     Consult to [facility] Care Management  B12 deficiency- His B12 level is normal now. -     CBC with Differential/Platelet; Future -     Folate; Future -     Vitamin B12; Future -     cyanocobalamin ((VITAMIN B-12)) injection 1,000 mcg  Bariatric surgery status- I will monitor him for vitamin deficiencies. -     IBC panel; Future -     Folate; Future -     Ferritin; Future -     Vitamin B12; Future -     VITAMIN D 25 Hydroxy  (Vit-D Deficiency, Fractures); Future -     Vitamin B1; Future -     Cholecalciferol 1.25 MG (50000 UT) capsule; Take 1 capsule (50,000 Units total) by mouth once a week.  Hyperlipidemia with target LDL less than 100- Statin therapy is not indicated since he is under the age of 74. -     Comprehensive metabolic panel; Future  Pure hyperglyceridemia- I have asked him to start taking a fish oil supplement to avoid complications such as pancreatitis. -     Icosapent Ethyl (VASCEPA) 1 g CAPS; Take 2 capsules (2 g total) by mouth 2 (two) times daily.  Zinc deficiency- I will monitor his zinc level. -     Zinc; Future  Vitamin D deficiency- His vitamin D level remains low.  I have asked him to restart a vitamin D supplement. -     VITAMIN D 25 Hydroxy (Vit-D Deficiency, Fractures); Future -     Cholecalciferol 1.25 MG (50000 UT) capsule; Take 1 capsule (50,000 Units total) by mouth once a week.  Routine general medical examination at a health care facility- Exam completed, labs reviewed, he refused a flu vaccine, patient education material was given. -     Lipid panel; Future -     HIV Antibody (routine testing w rflx); Future  Essential hypertension, benign- His blood pressure is adequately well controlled.  Will continue the combination of olmesartan, amlodipine, and hydrochlorothiazide. -     CBC with Differential/Platelet; Future -     Comprehensive metabolic panel; Future -     TSH; Future -     Urinalysis, Routine w reflex microscopic; Future  Gastroesophageal reflux disease without esophagitis- His symptoms are well controlled.  Type 2 diabetes mellitus with complication, with long-term current use of insulin (HCC) -     canagliflozin (INVOKANA) 300 MG TABS tablet; Take 1 tablet (300 mg total) by mouth daily. -     Cons[facility]  Viral pharyngitis -     Benzocaine-Menthol (CEPACOL INSTAMAX) 15-20 MG LOZG; Use as directed 1 lozenge in the mouth or throat 4 (four)  times daily as needed.   I have discontinued Cipriano MileWinston C. Vicente's insulin degludec and Cholecalciferol. I have also changed his INVOKANA to canagliflozin. Additionally, I am having him start on Insulin Degludec, Benzocaine-Menthol, Cholecalciferol, and Icosapent Ethyl. Lastly, I am having him maintain his albuterol, glucose blood, ONE TOUCH ULTRA TEST, B-D UF III MINI PEN NEEDLES, zinc gluconate, glucose blood, Olmesartan-amLODIPine-HCTZ, sildenafil, and sitaGLIPtin-metformin. We administered cyanocobalamin.  Meds ordered this encounter  Medications  . Insulin Degludec (TRESIBA FLEXTOUCH) 200 UNIT/ML SOPN    Sig: Inject 50 Units into the skin daily.    Dispense:  9 mL    Refill:  1  . cyanocobalamin ((VITAMIN B-12)) injection 1,000 mcg  . canagliflozin (INVOKANA) 300 MG TABS tablet    Sig: Take 1 tablet (300 mg total) by mouth daily.    Dispense:  90 tablet    Refill:  0  . Benzocaine-Menthol (CEPACOL INSTAMAX) 15-20 MG LOZG    Sig: Use as directed 1 lozenge in the mouth or throat 4 (four) times daily as needed.    Dispense:  20 lozenge    Refill:  0  . Cholecalciferol 1.25 MG (50000 UT) capsule    Sig: Take 1 capsule (50,000 Units total) by mouth once a week.    Dispense:  12 capsule    Refill:  0  . Icosapent Ethyl (VASCEPA) 1 g CAPS    Sig: Take 2 capsules (2 g total) by mouth 2 (two) times daily.    Dispense:  360 capsule    Refill:  1  . sitaGLIPtin-metformin (JANUMET) 50-1000 MG tablet    Sig: Take 1 tablet by mouth 2 (two) times daily with a meal.    Dispense:  180 tablet    Refill:  0     Follow-up: Return in about 3 months (around 08/29/2018).  Sanda Lingerhomas Ciela Mahajan, MD

## 2018-05-30 NOTE — Patient Instructions (Signed)
Type 2 Diabetes Mellitus, Diagnosis, Adult Type 2 diabetes (type 2 diabetes mellitus) is a long-term (chronic) disease. In type 2 diabetes, one or both of these problems may be present:  The pancreas does not make enough of a hormone called insulin.  Cells in the body do not respond properly to insulin that the body makes (insulin resistance). Normally, insulin allows blood sugar (glucose) to enter cells in the body. The cells use glucose for energy. Insulin resistance or lack of insulin causes excess glucose to build up in the blood instead of going into cells. As a result, high blood glucose (hyperglycemia) develops. What increases the risk? The following factors may make you more likely to develop type 2 diabetes:  Having a family member with type 2 diabetes.  Being overweight or obese.  Having an inactive (sedentary) lifestyle.  Having been diagnosed with insulin resistance.  Having a history of prediabetes, gestational diabetes, or polycystic ovary syndrome (PCOS).  Being of American-Indian, African-American, Hispanic/Latino, or Asian/Pacific Islander descent. What are the signs or symptoms? In the early stage of this condition, you may not have symptoms. Symptoms develop slowly and may include:  Increased thirst (polydipsia).  Increased hunger(polyphagia).  Increased urination (polyuria).  Increased urination during the night (nocturia).  Unexplained weight loss.  Frequent infections that keep coming back (recurring).  Fatigue.  Weakness.  Vision changes, such as blurry vision.  Cuts or bruises that are slow to heal.  Tingling or numbness in the hands or feet.  Dark patches on the skin (acanthosis nigricans). How is this diagnosed? This condition is diagnosed based on your symptoms, your medical history, a physical exam, and your blood glucose level. Your blood glucose may be checked with one or more of the following blood tests:  A fasting blood glucose (FBG)  test. You will not be allowed to eat (you will fast) for 8 hours or longer before a blood sample is taken.  A random blood glucose test. This test checks blood glucose at any time of day regardless of when you ate.  An A1c (hemoglobin A1c) blood test. This test provides information about blood glucose control over the previous 2-3 months.  An oral glucose tolerance test (OGTT). This test measures your blood glucose at two times: ? After fasting. This is your baseline blood glucose level. ? Two hours after drinking a beverage that contains glucose. You may be diagnosed with type 2 diabetes if:  Your FBG level is 126 mg/dL (7.0 mmol/L) or higher.  Your random blood glucose level is 200 mg/dL (11.1 mmol/L) or higher.  Your A1c level is 6.5% or higher.  Your OGTT result is higher than 200 mg/dL (11.1 mmol/L). These blood tests may be repeated to confirm your diagnosis. How is this treated? Your treatment may be managed by a specialist called an endocrinologist. Type 2 diabetes may be treated by following instructions from your health care provider about:  Making diet and lifestyle changes. This may include: ? Following an individualized nutrition plan that is developed by a diet and nutrition specialist (registered dietitian). ? Exercising regularly. ? Finding ways to manage stress.  Checking your blood glucose level as often as told.  Taking diabetes medicines or insulin daily. This helps to keep your blood glucose levels in the healthy range. ? If you use insulin, you may need to adjust the dosage depending on how physically active you are and what foods you eat. Your health care provider will tell you how to adjust your dosage.    Taking medicines to help prevent complications from diabetes, such as: ? Aspirin. ? Medicine to lower cholesterol. ? Medicine to control blood pressure. Your health care provider will set individualized treatment goals for you. Your goals will be based on  your age, other medical conditions you have, and how you respond to diabetes treatment. Generally, the goal of treatment is to maintain the following blood glucose levels:  Before meals (preprandial): 80-130 mg/dL (4.4-7.2 mmol/L).  After meals (postprandial): below 180 mg/dL (10 mmol/L).  A1c level: less than 7%. Follow these instructions at home: Questions to ask your health care provider  Consider asking the following questions: ? Do I need to meet with a diabetes educator? ? Where can I find a support group for people with diabetes? ? What equipment will I need to manage my diabetes at home? ? What diabetes medicines do I need, and when should I take them? ? How often do I need to check my blood glucose? ? What number can I call if I have questions? ? When is my next appointment? General instructions  Take over-the-counter and prescription medicines only as told by your health care provider.  Keep all follow-up visits as told by your health care provider. This is important.  For more information about diabetes, visit: ? American Diabetes Association (ADA): www.diabetes.org ? American Association of Diabetes Educators (AADE): www.diabeteseducator.org Contact a health care provider if:  Your blood glucose is at or above 240 mg/dL (13.3 mmol/L) for 2 days in a row.  You have been sick or have had a fever for 2 days or longer, and you are not getting better.  You have any of the following problems for more than 6 hours: ? You cannot eat or drink. ? You have nausea and vomiting. ? You have diarrhea. Get help right away if:  Your blood glucose is lower than 54 mg/dL (3.0 mmol/L).  You become confused or you have trouble thinking clearly.  You have difficulty breathing.  You have moderate or large ketone levels in your urine. Summary  Type 2 diabetes (type 2 diabetes mellitus) is a long-term (chronic) disease. In type 2 diabetes, the pancreas does not make enough of a  hormone called insulin, or cells in the body do not respond properly to insulin that the body makes (insulin resistance).  This condition is treated by making diet and lifestyle changes and taking diabetes medicines or insulin.  Your health care provider will set individualized treatment goals for you. Your goals will be based on your age, other medical conditions you have, and how you respond to diabetes treatment.  Keep all follow-up visits as told by your health care provider. This is important. This information is not intended to replace advice given to you by your health care provider. Make sure you discuss any questions you have with your health care provider. Document Released: 04/24/2005 Document Revised: 11/23/2016 Document Reviewed: 05/28/2015 Elsevier Interactive Patient Education  2019 Elsevier Inc.  

## 2018-05-31 LAB — VITAMIN B12: Vitamin B-12: >1525 pg/mL — ABNORMAL HIGH (ref 211–911)

## 2018-05-31 LAB — FOLATE: Folate: 19.4 ng/mL (ref >5.9)

## 2018-05-31 LAB — VITAMIN D 25 HYDROXY (VIT D DEFICIENCY, FRACTURES): VITD: 21.04 ng/mL — ABNORMAL LOW (ref 30.00–100.00)

## 2018-05-31 MED ORDER — ICOSAPENT ETHYL 1 G PO CAPS: 2.0000 | ORAL_CAPSULE | Freq: Two times a day (BID) | ORAL | 1 refills | Status: DC

## 2018-05-31 MED ORDER — CHOLECALCIFEROL 1.25 MG (50000 UT) PO CAPS: 50000.0000 [IU] | ORAL_CAPSULE | ORAL | 0 refills | Status: DC

## 2018-06-03 LAB — HIV ANTIBODY (ROUTINE TESTING W REFLEX): HIV 1&2 Ab, 4th Generation: NONREACTIVE

## 2018-06-03 LAB — ZINC: Zinc: 63 ug/dL (ref 60–130)

## 2018-06-03 LAB — VITAMIN B1: Vitamin B1 (Thiamine): 11 nmol/L (ref 8–30)

## 2018-06-03 MED ORDER — SITAGLIPTIN PHOS-METFORMIN HCL 50-1000 MG PO TABS: 1.0000 | ORAL_TABLET | Freq: Two times a day (BID) | ORAL | 0 refills | Status: DC

## 2018-06-06 ENCOUNTER — Other Ambulatory Visit: Payer: Self-pay | Admitting: *Deleted

## 2018-06-06 NOTE — Patient Outreach (Signed)
[facility]) Care Management  06/06/2018  Mark AnchorsWinston C Oconnor 04/17/1981 [id number]   Subjective: Telephone call to patient's home  / mobile number, no answer, left HIPAA compliant voicemail message, and requested call back.    Objective: Per KPN (Knowledge Performance Now, point of care tool) and chart review, patient has had no recent hospitalizations or ED visits.   Patient also has a history of diabetes and hyperlipidemia.    Assessment: Received BC[employer] MD (Dr. Sanda Lingerhomas Jones) referral on 05/31/2018.    Referral reason:  E11.8 Type II diabetes mellitus with manifestations (HCC)   E11.8, Z79.4 Type 2 diabetes mellitus with complication, with long-term    current use of insulin (HCC)   Reason for consult -> poorly controlled DM2      Diagnoses of -> Diabetes      Expected date of contact -> within 10 days         Disposition: Return in about 3 months (around 08/29/2018).   Screening  follow up pending patient contact.       Plan: RNCM will send unsuccessful outreach  letter, [facility] pamph[facility], will call patient for 2nd telephone outreach attempt within 4 business days, screening follow up, and will proceed with case closure within 10 business days if no return call.      Mark Teagarden H. Gardiner Barefootooper RN, BSN, CCM [facility] Telephonic CM Phone: [phone] Fax: [phone]

## 2018-06-11 ENCOUNTER — Other Ambulatory Visit: Payer: Self-pay | Admitting: *Deleted

## 2018-06-11 NOTE — Patient Outreach (Signed)
[facility]) Care Management  06/11/2018  Mark Oconnor [birth year] [id number]   Subjective: Telephone call to patient's home  / mobile number, no answer, left HIPAA compliant voicemail message, and requested call back.    Objective: Per KPN (Knowledge Performance Now, point of care tool) and chart review, patient has had no recent hospitalizations or ED visits.   Patient also has a history of diabetes and hyperlipidemia.    Assessment: Received BCBS MD (Dr. Sanda Linger) referral on 05/31/2018.    Referral reason:  E11.8 Type II diabetes mellitus with manifestations (HCC)   E11.8, Z79.4 Type 2 diabetes mellitus with complication, with long-term    current use of insulin (HCC)   Reason for consult -> poorly controlled DM2      Diagnoses of -> Diabetes      Expected date of contact -> within 10 days         Disposition: Return in about 3 months (around 08/29/2018).   Screening  follow up pending patient contact.       Plan: RNCM has sent unsuccessful outreach  letter, [facility] pamphlet, will call patient for 3rd telephone outreach attempt within 4 business days, screening follow up, and will proceed with case closure within 10 business days if no return call.      Mark Oconnor H. Gardiner Barefoot, BSN, CCM [facility] Care Management [facility] Telephonic CM Phone: [phone] Fax: [phone]

## 2018-06-13 ENCOUNTER — Other Ambulatory Visit: Payer: Self-pay | Admitting: *Deleted

## 2018-06-13 NOTE — Patient Outreach (Signed)
[facility]) Care Management  06/13/2018  Mark Oconnor 1981-04-05 [id number]   Subjective: Telephone call to patient's home / mobile number, spoke with patient, and HIPAA verified.  Discussed [facility] Care Management BCBS MD Referral follow up, patient voiced understanding, and is in agreement to follow up.   States he has taken classes at the [facility] is the past, has not had services through [facility] Care Management in the past, is currently at work, unable to talk at this time, and will call RNCM back after 3:00pm.     Objective:Per KPN (Knowledge Performance Now, point of care tool) and chart review,patient has had no recent hospitalizations or ED visits. Patient also has a history of diabetes and hyperlipidemia.    Assessment: Received [employer]BS MD (Dr. Sanda Linger) referral on 05/31/2018.  Referral reason: E11.8 Type II diabetes mellitus with manifestations (HCC)   E11.8, Z79.4 Type 2 diabetes mellitus with complication, with long-term    current use of insulin (HCC)   Reason for consult ->poorly controlled DM2      Diagnoses of ->Diabetes      Expected date of contact ->within 10 days         Disposition: Return in about 3 months (around 08/29/2018).   Screening follow up pending patient contact.      Plan:RNCM has sent unsuccessful outreach letter, [facility] pamphlet, will proceed with case closure within 10 business days if no return call.      Strider Vallance H. Gardiner Barefoot, BSN, CCM [facility] Care Management [facility] Telephonic CM Phone: [phone] Fax: [phone]

## 2018-06-20 ENCOUNTER — Other Ambulatory Visit: Payer: Self-pay | Admitting: *Deleted

## 2018-06-20 NOTE — Patient Outreach (Signed)
[facility]) Care Management  06/20/2018  Mark Oconnor [birth year] [id number]   No response from patient outreach attempts will proceed with case closure.    Objective:Per KPN (Knowledge Performance Now, point of care tool) and chart review,patient has had no recent hospitalizations or ED visits. Patient also has a history of diabetes and hyperlipidemia.    Assessment: Received [employer]BS MD (Dr. Sanda Linger) referral on 05/31/2018.  Referral reason: E11.8 Type II diabetes mellitus with manifestations (HCC)   E11.8, Z79.4 Type 2 diabetes mellitus with complication, with long-term    current use of insulin (HCC)   Reason for consult ->poorly controlled DM2      Diagnoses of ->Diabetes      Expected date of contact ->within 10 days         Disposition: Return in about 3 months (around 08/29/2018).   Screening follow up not completed due to unable to contact patient and will proceed with case closure.      Plan:Case closure due to unable to reach.  RNCM will send MD case closure letter.       [name] H. Gardiner Barefoot, BSN, CCM [facility] Care Management [facility] Telephonic CM Phone: [phone] Fax: [phone]

## 2018-07-24 ENCOUNTER — Encounter: Payer: Self-pay | Admitting: Internal Medicine

## 2018-09-09 ENCOUNTER — Other Ambulatory Visit: Payer: Self-pay | Admitting: Internal Medicine

## 2018-09-09 ENCOUNTER — Other Ambulatory Visit: Payer: Self-pay | Admitting: Endocrinology

## 2018-09-09 DIAGNOSIS — Z794 Long term (current) use of insulin: Secondary | ICD-10-CM

## 2018-09-09 DIAGNOSIS — I10 Essential (primary) hypertension: Secondary | ICD-10-CM

## 2018-09-09 DIAGNOSIS — E118 Type 2 diabetes mellitus with unspecified complications: Secondary | ICD-10-CM

## 2018-09-09 DIAGNOSIS — N5201 Erectile dysfunction due to arterial insufficiency: Secondary | ICD-10-CM

## 2018-09-10 ENCOUNTER — Ambulatory Visit (INDEPENDENT_AMBULATORY_CARE_PROVIDER_SITE_OTHER): Payer: BLUE CROSS/BLUE SHIELD | Admitting: Endocrinology

## 2018-09-10 ENCOUNTER — Other Ambulatory Visit: Payer: Self-pay

## 2018-09-10 DIAGNOSIS — Z794 Long term (current) use of insulin: Secondary | ICD-10-CM

## 2018-09-10 DIAGNOSIS — E118 Type 2 diabetes mellitus with unspecified complications: Secondary | ICD-10-CM | POA: Diagnosis not present

## 2018-09-10 MED ORDER — SEMAGLUTIDE(0.25 OR 0.5MG/DOS) 2 MG/1.5ML ~~LOC~~ SOPN: 0.2500 mg | PEN_INJECTOR | SUBCUTANEOUS | 11 refills | Status: DC

## 2018-09-10 NOTE — Progress Notes (Signed)
Subjective:    Patient ID: Mark Oconnor, male    DOB: 1980/09/01, 38 y.o.   MRN: [id number]  HPI  telehealth visit today via doxy video visit.  Alternatives to telehealth are presented to this patient, and the patient agrees to the telehealth visit. Pt is advised of the cost of the visit, and agrees to this, also.   Patient is in his car, and I am at the office.   Persons attending the telehealth visit: the patient and I Pt returns for f/u of diabetes mellitus: DM type: Insulin-requiring type 2 Dx'ed: 2011 Complications: none Therapy: insulin since dx, and 3 oral meds.   DKA: never Severe hypoglycemia: never.  Pancreatitis: never.   Other: he had gastric bypass surgery in 2015--he has lost approx 90 lbs since then, but pt feels this precipitated excessive alcohol intake; he declines multiple daily injections.   Interval history: Pt says he misses the insulin for a week at a time.  He says he skips due to weight gain.  no cbg record, but states cbg's are in the 200's.  He takes oral meds as rx'ed.   Past Medical History:  Diagnosis Date  . Allergy   . Diabetes mellitus   . GERD (gastroesophageal reflux disease)   . Hyperlipidemia   . Hypertension   . Morbid obesity (HCC)   . Sleep apnea 05-21-13   denies.Stop/Bang score =4 with PAT visit    Past Surgical History:  Procedure Laterality Date  . BREATH TEK H PYLORI N/A 07/12/2012   Procedure: BREATH TEK H PYLORI;  Surgeon: Mariella Saa, MD;  Location: [facility];  Service: General;  Laterality: N/A;  . GASTRIC ROUX-EN-Y N/A 05/26/2013   Procedure: LAPAROSCOPIC ROUX-EN-Y GASTRIC BYPASS WITH UPPER ENDOSCOPY;  Surgeon: Mariella Saa, MD;  Location: [facility];  Service: General;  Laterality: N/A;    Social History   Socioeconomic History  . Marital status: Married    Spouse name: Not on file  . Number of children: Not on file  . Years of education: Not on file  . Highest education level: Not on file  Occupational  History  . Not on file  Social Needs  . Financial resource strain: Not on file  . Food insecurity:    Worry: Not on file    Inability: Not on file  . Transportation needs:    Medical: Not on file    Non-medical: Not on file  Tobacco Use  . Smoking status: Former Smoker    Packs/day: 0.50    Years: 15.00    Pack years: 7.50    Types: Cigarettes    Last attempt to quit: 12/07/2011    Years since quitting: 6.7  . Smokeless tobacco: Never Used  Substance and Sexual Activity  . Alcohol use: Yes    Alcohol/week: 10.0 standard drinks    Types: 10 Cans of beer per week    Comment: occasional to rare  . Drug use: No  . Sexual activity: Yes  Lifestyle  . Physical activity:    Days per week: Not on file    Minutes per session: Not on file  . Stress: Not on file  Relationships  . Social connections:    Talks on phone: Not on file    Gets together: Not on file    Attends religious service: Not on file    Active member of club or organization: Not on file    Attends meetings of clubs or organizations: Not on  file    Relationship status: Not on file  . Intimate partner violence:    Fear of current or ex partner: Not on file    Emotionally abused: Not on file    Physically abused: Not on file    Forced sexual activity: Not on file  Other Topics Concern  . Not on file  Social History Narrative  . Not on file    Current Outpatient Medications on File Prior to Visit  Medication Sig Dispense Refill  . albuterol (PROVENTIL HFA;VENTOLIN HFA) 108 (90 BASE) MCG/ACT inhaler Inhale 2 puffs into the lungs every 6 (six) hours as needed for wheezing or shortness of breath.    . B-D UF III MINI PEN NEEDLES 31G X 5 MM MISC USE DAILY AS DIRECTED DX CODE E11.8 100 each 11  . Benzocaine-Menthol (CEPACOL INSTAMAX) 15-20 MG LOZG Use as directed 1 lozenge in the mouth or throat 4 (four) times daily as needed. 20 lozenge 0  . canagliflozin (INVOKANA) 300 MG TABS tablet Take 1 tablet (300 mg total) by  mouth daily. 90 tablet 0  . Cholecalciferol 1.25 MG (50000 UT) capsule Take 1 capsule (50,000 Units total) by mouth once a week. 12 capsule 0  . glucose blood (ONE TOUCH ULTRA TEST) test strip USE TO TEST BLOOD SUGAR TWICE DAILY    . glucose blood (ONETOUCH VERIO) test strip 1 each by Other route 2 (two) times daily. And lancets 2/day 100 each 3  . Icosapent Ethyl (VASCEPA) 1 g CAPS Take 2 capsules (2 g total) by mouth 2 (two) times daily. 360 capsule 1  . Insulin Degludec (TRESIBA FLEXTOUCH) 200 UNIT/ML SOPN Inject 50 Units into the skin daily. 9 mL 1  . Olmesartan-amLODIPine-HCTZ 40-5-12.5 MG TABS TAKE 1 TABLET BY MOUTH EVERY DAY 90 tablet 0  . ONE TOUCH ULTRA TEST test strip USE TO TEST BLOOD SUGAR TWICE DAILY 100 each 11  . sitaGLIPtin-metformin (JANUMET) 50-1000 MG tablet Take 1 tablet by mouth 2 (two) times daily with a meal. 180 tablet 0  . VIAGRA 100 MG tablet TAKE 1/2-1 BY MOUTH DAILY AS NEEDED FOR ERECTILE DYSFUNCTION 8 tablet 3  . zinc gluconate 50 MG tablet Take 1 tablet (50 mg total) by mouth daily. 90 tablet 1   No current facility-administered medications on file prior to visit.     Allergies  Allergen Reactions  . Other Itching    Tree nuts     Family History  Problem Relation Age of Onset  . Diabetes Father   . Cancer Neg Hx   . Heart disease Neg Hx   . Hyperlipidemia Neg Hx   . Hypertension Neg Hx   . Diabetes Mother   . Diabetes Brother        one brother  diabetic    There were no vitals taken for this visit.   Review of Systems He denies hypoglycemia    Objective:   Physical Exam       Assessment & Plan:  Insulin-requiring type 2 DM: he needs increased rx.   Patient Instructions  I have sent a prescription to your pharmacy, to add "Ozempic."   Please continue the same other diabetes medications, including insulin.  check your blood sugar twice a day.  vary the time of day when you check, between before the 3 meals, and at bedtime.  also check  if you have symptoms of your blood sugar being too high or too low.  please keep a record of the readings  and bring it to your next appointment here (or you can bring the meter itself).  You can write it on any piece of paper.  please call us sooner if your blood sugar goes below 70, or if you have a lot of readings over 200.   Please come back for a follow-up appointment in 2 months.

## 2018-09-10 NOTE — Patient Instructions (Addendum)
I have sent a prescription to your pharmacy, to add "Ozempic."   Please continue the same other diabetes medications, including insulin.  check your blood sugar twice a day.  vary the time of day when you check, between before the 3 meals, and at bedtime.  also check if you have symptoms of your blood sugar being too high or too low.  please keep a record of the readings and bring it to your next appointment here (or you can bring the meter itself).  You can write it on any piece of paper.  please call us sooner if your blood sugar goes below 70, or if you have a lot of readings over 200.   Please come back for a follow-up appointment in 2 months.

## 2018-09-11 ENCOUNTER — Ambulatory Visit: Payer: BLUE CROSS/BLUE SHIELD | Admitting: Endocrinology

## 2018-09-13 ENCOUNTER — Telehealth: Payer: Self-pay | Admitting: Endocrinology

## 2018-09-13 NOTE — Telephone Encounter (Signed)
Patient seen Dr. Everardo All on 09/10/2018 did not receive the Rx for   Semaglutide,0.25 or 0.5MG /DOS, (OZEMPIC, 0.25 OR 0.5 MG/DOSE,) 2 MG/1.5ML SOPN   And also needs a refill on  Insulin Degludec (TRESIBA FLEXTOUCH) 200 UNIT/ML SOPN   Sent to:  [id number] - [place], [place] - [address] AT [facility] [address] [phone] (Phone) [phone] (Fax)

## 2018-09-16 ENCOUNTER — Other Ambulatory Visit: Payer: Self-pay

## 2018-09-16 DIAGNOSIS — E118 Type 2 diabetes mellitus with unspecified complications: Secondary | ICD-10-CM

## 2018-09-16 MED ORDER — INSULIN DEGLUDEC 200 UNIT/ML ~~LOC~~ SOPN: 50.0000 [IU] | PEN_INJECTOR | Freq: Every day | SUBCUTANEOUS | 1 refills | Status: DC

## 2018-09-16 NOTE — Telephone Encounter (Signed)
Semaglutide,0.25 or 0.5MG /DOS, (OZEMPIC, 0.25 OR 0.5 MG/DOSE,) 2 MG/1.5ML SOPN 1 pen 11 09/10/2018    Sig - Route: Inject 0.25 mg into the skin once a week. - Subcutaneous   Sent to pharmacy as: Semaglutide,0.25 or 0.5MG /DOS, (OZEMPIC, 0.25 OR 0.5 MG/DOSE,) 2 MG/1.5ML Solution Pen-injector   E-Prescribing Status: Receipt confirmed by pharmacy (09/10/2018  3:31 PM EDT)  Insulin Degludec (TRESIBA FLEXTOUCH) 200 UNIT/ML SOPN 9 mL 1 09/16/2018    Sig - Route: Inject 50 Units into the skin daily. - Subcutaneous   Sent to pharmacy as: Insulin Degludec (TRESIBA FLEXTOUCH) 200 UNIT/ML Solution Pen-injector   E-Prescribing Status: Receipt confirmed by pharmacy (09/16/2018  9:08 AM EDT)       [id number] - [place], [place] - [address] AT [address]

## 2018-10-02 DIAGNOSIS — L0390 Cellulitis, unspecified: Secondary | ICD-10-CM | POA: Insufficient documentation

## 2018-10-04 ENCOUNTER — Encounter: Payer: Self-pay | Admitting: Podiatry

## 2018-10-04 ENCOUNTER — Other Ambulatory Visit: Payer: Self-pay

## 2018-10-04 ENCOUNTER — Ambulatory Visit: Payer: BLUE CROSS/BLUE SHIELD | Admitting: Podiatry

## 2018-10-04 VITALS — Temp 97.5°F

## 2018-10-04 DIAGNOSIS — I872 Venous insufficiency (chronic) (peripheral): Secondary | ICD-10-CM | POA: Diagnosis not present

## 2018-10-04 DIAGNOSIS — E119 Type 2 diabetes mellitus without complications: Secondary | ICD-10-CM | POA: Diagnosis not present

## 2018-10-04 DIAGNOSIS — Z794 Long term (current) use of insulin: Secondary | ICD-10-CM

## 2018-10-04 NOTE — Patient Instructions (Addendum)
Diabetes Mellitus and Foot Care Foot care is an important part of your health, especially when you have diabetes. Diabetes may cause you to have problems because of poor blood flow (circulation) to your feet and legs, which can cause your skin to:  Become thinner and drier.  Break more easily.  Heal more slowly.  Peel and crack. You may also have nerve damage (neuropathy) in your legs and feet, causing decreased feeling in them. This means that you may not notice minor injuries to your feet that could lead to more serious problems. Noticing and addressing any potential problems early is the best way to prevent future foot problems. How to care for your feet Foot hygiene  Wash your feet daily with warm water and mild soap. Do not use hot water. Then, pat your feet and the areas between your toes until they are completely dry. Do not soak your feet as this can dry your skin.  Trim your toenails straight across. Do not dig under them or around the cuticle. File the edges of your nails with an emery board or nail file.  Apply a moisturizing lotion or petroleum jelly to the skin on your feet and to dry, brittle toenails. Use lotion that does not contain alcohol and is unscented. Do not apply lotion between your toes. Shoes and socks  Wear clean socks or stockings every day. Make sure they are not too tight. Do not wear knee-high stockings since they may decrease blood flow to your legs.  Wear shoes that fit properly and have enough cushioning. Always look in your shoes before you put them on to be sure there are no objects inside.  To break in new shoes, wear them for just a few hours a day. This prevents injuries on your feet. Wounds, scrapes, corns, and calluses  Check your feet daily for blisters, cuts, bruises, sores, and redness. If you cannot see the bottom of your feet, use a mirror or ask someone for help.  Do not cut corns or calluses or try to remove them with medicine.  If you  find a minor scrape, cut, or break in the skin on your feet, keep it and the skin around it clean and dry. You may clean these areas with mild soap and water. Do not clean the area with peroxide, alcohol, or iodine.  If you have a wound, scrape, corn, or callus on your foot, look at it several times a day to make sure it is healing and not infected. Check for: ? Redness, swelling, or pain. ? Fluid or blood. ? Warmth. ? Pus or a bad smell. General instructions  Do not cross your legs. This may decrease blood flow to your feet.  Do not use heating pads or hot water bottles on your feet. They may burn your skin. If you have lost feeling in your feet or legs, you may not know this is happening until it is too late.  Protect your feet from hot and cold by wearing shoes, such as at the beach or on hot pavement.  Schedule a complete foot exam at least once a year (annually) or more often if you have foot problems. If you have foot problems, report any cuts, sores, or bruises to your health care provider immediately. Contact a health care provider if:  You have a medical condition that increases your risk of infection and you have any cuts, sores, or bruises on your feet.  You have an injury that is not   healing.  You have redness on your legs or feet.  You feel burning or tingling in your legs or feet.  You have pain or cramps in your legs and feet.  Your legs or feet are numb.  Your feet always feel cold.  You have pain around a toenail. Get help right away if:  You have a wound, scrape, corn, or callus on your foot and: ? You have pain, swelling, or redness that gets worse. ? You have fluid or blood coming from the wound, scrape, corn, or callus. ? Your wound, scrape, corn, or callus feels warm to the touch. ? You have pus or a bad smell coming from the wound, scrape, corn, or callus. ? You have a fever. ? You have a red line going up your leg. Summary  Check your feet every day  for cuts, sores, red spots, swelling, and blisters.  Moisturize feet and legs daily.  Wear shoes that fit properly and have enough cushioning.  If you have foot problems, report any cuts, sores, or bruises to your health care provider immediately.  Schedule a complete foot exam at least once a year (annually) or more often if you have foot problems. This information is not intended to replace advice given to you by your health care provider. Make sure you discuss any questions you have with your health care provider. Document Released: 04/21/2000 Document Revised: 06/06/2017 Document Reviewed: 05/26/2016 Elsevier Interactive Patient Education  2019 [employer].  1.

## 2018-10-06 ENCOUNTER — Encounter: Payer: Self-pay | Admitting: Podiatry

## 2018-10-06 NOTE — Progress Notes (Signed)
Subjective: Mark Oconnor presents today for diabetic foot examination. Mark Oconnor has been diabetic for about 10 years. He denies any history of foot wounds.  He does relate occasional numbness and tingling of both feet for the past 1-2 years.  Mark Oconnor relates his wife became concerned about discoloration of his left foot.  Past Medical History:  Diagnosis Date  . Allergy   . Diabetes mellitus   . GERD (gastroesophageal reflux disease)   . Hyperlipidemia   . Hypertension   . Morbid obesity (HCC)   . Sleep apnea 05-21-13   denies.Stop/Bang score =4 with PAT visit     Patient Active Problem List   Diagnosis Date Noted  . Wound cellulitis 10/02/2018  . Viral pharyngitis 05/30/2018  . Zinc deficiency 06/24/2017  . Alcohol intake above recommended sensible limits 10/11/2016  . B12 deficiency 06/09/2016  . Vitamin D deficiency 06/09/2016  . Type 2 diabetes mellitus with complication, with long-term current use of insulin (HCC) 08/05/2015  . Bariatric surgery status 07/07/2014  . Hyperlipidemia with target LDL less than 100 12/10/2012  . Low back pain 10/10/2012  . Obstructive sleep apnea, suspected 06/15/2012  . Morbid obesity with BMI of 40.0-44.9, adult (HCC) 06/07/2012  . Mild persistent asthma 12/22/2011  . Seasonal and perennial allergic rhinitis 12/21/2011  . Eczema 07/26/2011  . ED (erectile dysfunction) 04/26/2011  . Pure hyperglyceridemia 04/26/2011  . Essential hypertension, benign 04/26/2011  . Routine general medical examination at a health care facility 04/26/2011  . GERD (gastroesophageal reflux disease) 04/26/2011     Past Surgical History:  Procedure Laterality Date  . BREATH TEK H PYLORI N/A 07/12/2012   Procedure: BREATH TEK H PYLORI;  Surgeon: Mariella SaaBenjamin T Hoxworth, MD;  Location: [facility];  Service: General;  Laterality: N/A;  . GASTRIC ROUX-EN-Y N/A 05/26/2013   Procedure: LAPAROSCOPIC ROUX-EN-Y GASTRIC BYPASS WITH UPPER ENDOSCOPY;  Surgeon: Mariella SaaBenjamin  T Hoxworth, MD;  Location: [facility];  Service: General;  Laterality: N/A;      Current Outpatient Medications:  .  albuterol (PROVENTIL HFA;VENTOLIN HFA) 108 (90 BASE) MCG/ACT inhaler, Inhale 2 puffs into the lungs every 6 (six) hours as needed for wheezing or shortness of breath., Disp: , Rfl:  .  B-D UF III MINI PEN NEEDLES 31G X 5 MM MISC, USE DAILY AS DIRECTED DX CODE E11.8, Disp: 100 each, Rfl: 11 .  Benzocaine-Menthol (CEPACOL INSTAMAX) 15-20 MG LOZG, Use as directed 1 lozenge in the mouth or throat 4 (four) times daily as needed., Disp: 20 lozenge, Rfl: 0 .  canagliflozin (INVOKANA) 300 MG TABS tablet, Take 1 tablet (300 mg total) by mouth daily., Disp: 90 tablet, Rfl: 0 .  Cholecalciferol 1.25 MG (50000 UT) capsule, Take 1 capsule (50,000 Units total) by mouth once a week., Disp: 12 capsule, Rfl: 0 .  glucose blood (ONE TOUCH ULTRA TEST) test strip, USE TO TEST BLOOD SUGAR TWICE DAILY, Disp: , Rfl:  .  glucose blood (ONETOUCH VERIO) test strip, 1 each by Other route 2 (two) times daily. And lancets 2/day, Disp: 100 each, Rfl: 3 .  Icosapent Ethyl (VASCEPA) 1 g CAPS, Take 2 capsules (2 g total) by mouth 2 (two) times daily., Disp: 360 capsule, Rfl: 1 .  Insulin Degludec (TRESIBA FLEXTOUCH) 200 UNIT/ML SOPN, Inject 50 Units into the skin daily., Disp: 9 mL, Rfl: 1 .  Olmesartan-amLODIPine-HCTZ 40-5-12.5 MG TABS, TAKE 1 TABLET BY MOUTH EVERY DAY, Disp: 90 tablet, Rfl: 0 .  ONE TOUCH ULTRA TEST test strip, USE  TO TEST BLOOD SUGAR TWICE DAILY, Disp: 100 each, Rfl: 11 .  Semaglutide,0.25 or 0.5MG /DOS, (OZEMPIC, 0.25 OR 0.5 MG/DOSE,) 2 MG/1.5ML SOPN, Inject 0.25 mg into the skin once a week., Disp: 1 pen, Rfl: 11 .  sitaGLIPtin-metformin (JANUMET) 50-1000 MG tablet, Take 1 tablet by mouth 2 (two) times daily with a meal., Disp: 180 tablet, Rfl: 0 .  VIAGRA 100 MG tablet, TAKE 1/2-1 BY MOUTH DAILY AS NEEDED FOR ERECTILE DYSFUNCTION, Disp: 8 tablet, Rfl: 3 .  zinc gluconate 50 MG tablet, Take 1  tablet (50 mg total) by mouth daily., Disp: 90 tablet, Rfl: 1   Allergies  Allergen Reactions  . Other Itching    Tree nuts      Social History   Occupational History  . Not on file  Tobacco Use  . Smoking status: Former Smoker    Packs/day: 0.50    Years: 15.00    Pack years: 7.50    Types: Cigarettes    Last attempt to quit: 12/07/2011    Years since quitting: 6.8  . Smokeless tobacco: Never Used  Substance and Sexual Activity  . Alcohol use: Yes    Alcohol/week: 10.0 standard drinks    Types: 10 Cans of beer per week    Comment: occasional to rare  . Drug use: No  . Sexual activity: Yes     Family History  Problem Relation Age of Onset  . Diabetes Father   . Cancer Neg Hx   . Heart disease Neg Hx   . Hyperlipidemia Neg Hx   . Hypertension Neg Hx   . Diabetes Mother   . Diabetes Brother        one brother  diabetic     Immunization History  Administered Date(s) Administered  . Influenza Split 04/26/2011  . Pneumococcal Polysaccharide-23 12/22/2011, 12/12/2013  . Tdap 04/25/2010     Review of systems: Positive Findings in bold print.  Constitutional:  chills, fatigue, fever, sweats, weight change Communication: Nurse, learning disability, sign Presenter, broadcasting, hand writing, iPad/Android device Head: headaches, head injury Eyes: changes in vision, eye pain, glaucoma, cataracts, macular degeneration, diplopia, glare,  light sensitivity, eyeglasses or contacts, blindness Ears nose mouth throat: hearing impaired, hearing aids,  ringing in ears, deaf, sign language,  vertigo,   nosebleeds,  rhinitis,  cold sores, snoring, swollen glands Cardiovascular: HTN, edema, arrhythmia, pacemaker in place, defibrillator in place, chest pain/tightness, chronic anticoagulation, blood clot, heart failure, MI Peripheral Vascular: leg cramps, varicose veins, blood clots, lymphedema, varicosities Respiratory:  difficulty breathing, denies congestion, SOB, wheezing, cough, emphysema,  asthma Gastrointestinal: change in appetite or weight, abdominal pain, constipation, diarrhea, nausea, vomiting, vomiting blood, change in bowel habits, abdominal pain, jaundice, rectal bleeding, hemorrhoids, GERD Genitourinary:  nocturia,  pain on urination, polyuria,  blood in urine, Foley catheter, urinary urgency, ESRD on hemodialysis Musculoskeletal: amputation, cramping, stiff joints, painful joints, decreased joint motion, fractures, OA, gout, hemiplegia, paraplegia, uses cane, wheelchair bound, uses walker, uses rollator Skin: +changes in toenails, color change, dryness, itching, mole changes, rash, wound(s) Neurological: headaches, numbness in feet, paresthesias in feet, burning in feet, fainting,  seizures, change in speech. denies headaches, memory problems/poor historian, cerebral palsy, weakness, paralysis, CVA, TIA Endocrine: diabetes, hypothyroidism, hyperthyroidism,  goiter, dry mouth, flushing, heat intolerance,  cold intolerance,  excessive thirst, denies polyuria,  nocturia Hematological:  easy bleeding, excessive bleeding, easy bruising, enlarged lymph nodes, on long term blood thinner, history of past transusions Allergy/immunological:  hives, eczema, frequent infections, multiple drug allergies, seasonal allergies, transplant  recipient, multiple food allergies Psychiatric:  anxiety, depression, mood disorder, suicidal ideations, hallucinations, insomnia  Objective: Vitals:   10/04/18 1236  Temp: (!) 97.5 F (36.4 C)    Vascular Examination: Capillary refill time immediate  x 10 digits.  Dorsalis pedis and posterior tibial pulses present b/l.  Digital hair present x 10 digits.  Skin temperature WNL b/l.  Dermatological Examination: Skin with normal turgor, texture and tone.  Venous stasis skin changes noted LLE.  Toenails 1-5 b/l nondystrophic and are well maintained b/l.  Musculoskeletal: Muscle strength 5/5 to all LE muscle groups  Neurological: Sensation  intact with 10 gram monofilament.  Vibratory sensation intact.  Assessment: Venous insufficiency LLE NIDDM with long term insulin use  Plan: 1. Patient to continue soft, supportive shoe gear daily. 2. Discussed venous insuficiency and compression therapy. Prescription written for 2 pair of compression knee highs 15-20 mmHg for daily use.  3. Patient to report any pedal injuries to medical professional immediately. 4. Follow up one year.  5. Patient/POA to call should there be a concern in the interim.

## 2018-10-07 ENCOUNTER — Other Ambulatory Visit: Payer: Self-pay | Admitting: Internal Medicine

## 2018-10-07 ENCOUNTER — Telehealth: Payer: Self-pay

## 2018-10-07 DIAGNOSIS — R202 Paresthesia of skin: Secondary | ICD-10-CM

## 2018-10-07 DIAGNOSIS — R200 Anesthesia of skin: Secondary | ICD-10-CM | POA: Insufficient documentation

## 2018-10-07 NOTE — Telephone Encounter (Signed)
Pt is requesting referral to neurology for tingling in fehis et.

## 2018-10-07 NOTE — Telephone Encounter (Signed)
Copied from CRM [id number]. Topic: General - Other >> Oct 07, 2018  2:42 PM Gwenlyn Fudge A wrote: Reason for CRM: Pt called requesting to get a referral for a neurologist for his feet. Pt states he does not mind setting up appt if needed regarding referral. Please advise. >> Oct 07, 2018  2:54 PM Morphies, Hermine Messick wrote: Mol[place] Knock for referral or does patient need an appointment?

## 2018-10-08 ENCOUNTER — Telehealth: Payer: Self-pay

## 2018-10-08 ENCOUNTER — Ambulatory Visit: Payer: BLUE CROSS/BLUE SHIELD

## 2018-10-08 NOTE — Telephone Encounter (Signed)
lvm advising patient that lab results shared with him yesterday indicate his b12 lab level is actually high, and per dr Yetta Barre, no need to come in for b12 injection/nurse visit today, I have cancelled that appt for today--call back if any further questions, can be transferred to [place] office if needed

## 2018-10-10 NOTE — Telephone Encounter (Signed)
PCP has entered referral. Closing note.

## 2018-10-17 DIAGNOSIS — Q825 Congenital non-neoplastic nevus: Secondary | ICD-10-CM | POA: Diagnosis not present

## 2018-10-17 DIAGNOSIS — L308 Other specified dermatitis: Secondary | ICD-10-CM | POA: Diagnosis not present

## 2018-10-17 DIAGNOSIS — L309 Dermatitis, unspecified: Secondary | ICD-10-CM | POA: Diagnosis not present

## 2018-11-12 ENCOUNTER — Ambulatory Visit: Payer: BLUE CROSS/BLUE SHIELD | Admitting: Endocrinology

## 2018-11-14 ENCOUNTER — Other Ambulatory Visit: Payer: Self-pay

## 2018-11-14 ENCOUNTER — Other Ambulatory Visit: Payer: Self-pay | Admitting: Endocrinology

## 2018-11-14 DIAGNOSIS — E118 Type 2 diabetes mellitus with unspecified complications: Secondary | ICD-10-CM

## 2018-11-18 ENCOUNTER — Encounter: Payer: Self-pay | Admitting: Endocrinology

## 2018-11-18 ENCOUNTER — Ambulatory Visit (INDEPENDENT_AMBULATORY_CARE_PROVIDER_SITE_OTHER): Payer: BC Managed Care – PPO | Admitting: Endocrinology

## 2018-11-18 ENCOUNTER — Other Ambulatory Visit: Payer: Self-pay

## 2018-11-18 VITALS — BP 100/70 | HR 88 | Ht 75.0 in | Wt 255.0 lb

## 2018-11-18 DIAGNOSIS — E118 Type 2 diabetes mellitus with unspecified complications: Secondary | ICD-10-CM

## 2018-11-18 DIAGNOSIS — Z794 Long term (current) use of insulin: Secondary | ICD-10-CM | POA: Diagnosis not present

## 2018-11-18 LAB — POCT GLYCOSYLATED HEMOGLOBIN (HGB A1C): Hemoglobin A1C: 13.1 % — AB (ref 4.0–5.6)

## 2018-11-18 MED ORDER — ONETOUCH DELICA LANCETS 30G MISC: 1.0000 | Freq: Two times a day (BID) | 2 refills | Status: AC

## 2018-11-18 MED ORDER — ONETOUCH ULTRA VI STRP: 1.0000 | ORAL_STRIP | Freq: Two times a day (BID) | 3 refills | Status: AC

## 2018-11-18 MED ORDER — ONETOUCH ULTRA VI STRP: ORAL_STRIP | 12 refills | Status: DC

## 2018-11-18 MED ORDER — OZEMPIC (1 MG/DOSE) 2 MG/1.5ML ~~LOC~~ SOPN: 1.0000 mg | PEN_INJECTOR | SUBCUTANEOUS | 3 refills | Status: AC

## 2018-11-18 NOTE — Patient Instructions (Addendum)
I have sent a prescription to your pharmacy, to increase the Ozempic again.  Please continue the same other diabetes medications, including insulin.  It is important for your health to not miss it.  check your blood sugar twice a day.  vary the time of day when you check, between before the 3 meals, and at bedtime.  also check if you have symptoms of your blood sugar being too high or too low.  please keep a record of the readings and bring it to your next appointment here (or you can bring the meter itself).  You can write it on any piece of paper.  please call us sooner if your blood sugar goes below 70, or if you have a lot of readings over 200.   Please come back for a follow-up appointment in 2 months.

## 2018-11-18 NOTE — Progress Notes (Signed)
Subjective:    Patient ID: Mark Oconnor, male    DOB: 09/23/1980, 38 y.o.   MRN: [id number]  HPI Pt returns for f/u of diabetes mellitus: DM type: Insulin-requiring type 2 Dx'ed: 2011 Complications: none Therapy: insulin since dx, Ozempic, and 3 oral meds.   DKA: never Severe hypoglycemia: never.  Pancreatitis: never.   Other: he had gastric bypass surgery in 2015--he has lost approx 90 lbs since then, but pt says this precipitated excessive alcohol intake; he declines multiple daily injections.    Interval history: no cbg record, but states cbg's are still in the 200's.  He sometimes misses meds.  He says he does not forget--he just chooses not to take.  Pt says he has reduced EtOH consumption.  He takes Ozempic, 0.5 mg Q week.   Past Medical History:  Diagnosis Date  . Allergy   . Diabetes mellitus   . GERD (gastroesophageal reflux disease)   . Hyperlipidemia   . Hypertension   . Morbid obesity (HCC)   . Sleep apnea 05-21-13   denies.Stop/Bang score =4 with PAT visit    Past Surgical History:  Procedure Laterality Date  . BREATH TEK H PYLORI N/A 07/12/2012   Procedure: BREATH TEK H PYLORI;  Surgeon: Mariella SaaBenjamin T Hoxworth, MD;  [facility];  Service: General;  Laterality: N/A;  . GASTRIC ROUX-EN-Y N/A 05/26/2013   Procedure: LAPAROSCOPIC ROUX-EN-Y GASTRIC BYPASS WITH UPPER ENDOSCOPY;  Surgeon: Mariella SaaBenjamin T Hoxworth, MD;  Location: [facility];  Service: General;  Laterality: N/A;    Social History   Socioeconomic History  . Marital status: Married    Spouse name: Not on file  . Number of children: Not on file  . Years of education: Not on file  . Highest education level: Not on file  Occupational History  . Not on file  Social Needs  . Financial resource strain: Not on file  . Food insecurity    Worry: Not on file    Inability: Not on file  . Transportation needs    Medical: Not on file    Non-medical: Not on file  Tobacco Use  . Smoking status: Former Smoker     Packs/day: 0.50    Years: 15.00    Pack years: 7.50    Types: Cigarettes    Quit date: 12/07/2011    Years since quitting: 6.9  . Smokeless tobacco: Never Used  Substance and Sexual Activity  . Alcohol use: Yes    Alcohol/week: 10.0 standard drinks    Types: 10 Cans of beer per week    Comment: occasional to rare  . Drug use: No  . Sexual activity: Yes  Lifestyle  . Physical activity    Days per week: Not on file    Minutes per session: Not on file  . Stress: Not on file  Relationships  . Social Musicianconnections    Talks on phone: Not on file    Gets together: Not on file    Attends religious service: Not on file    Active member of club or organization: Not on file    Attends meetings of clubs or organizations: Not on file    Relationship status: Not on file  . Intimate partner violence    Fear of current or ex partner: Not on file    Emotionally abused: Not on file    Physically abused: Not on file    Forced sexual activity: Not on file  Other Topics Concern  . Not  on file  Social History Narrative  . Not on file    Current Outpatient Medications on File Prior to Visit  Medication Sig Dispense Refill  . B-D UF III MINI PEN NEEDLES 31G X 5 MM MISC USE DAILY AS DIRECTED DX CODE E11.8 100 each 11  . Benzocaine-Menthol (CEPACOL INSTAMAX) 15-20 MG LOZG Use as directed 1 lozenge in the mouth or throat 4 (four) times daily as needed. 20 lozenge 0  . canagliflozin (INVOKANA) 300 MG TABS tablet Take 1 tablet (300 mg total) by mouth daily. 90 tablet 0  . Cholecalciferol 1.25 MG (50000 UT) capsule Take 1 capsule (50,000 Units total) by mouth once a week. 12 capsule 0  . Icosapent Ethyl (VASCEPA) 1 g CAPS Take 2 capsules (2 g total) by mouth 2 (two) times daily. 360 capsule 1  . Olmesartan-amLODIPine-HCTZ 40-5-12.5 MG TABS TAKE 1 TABLET BY MOUTH EVERY DAY 90 tablet 0  . sitaGLIPtin-metformin (JANUMET) 50-1000 MG tablet Take 1 tablet by mouth 2 (two) times daily with a meal. 180  tablet 0  . TRESIBA FLEXTOUCH 200 UNIT/ML SOPN INJECT 50 UNITS INTO THE SKIN DAILY. 9 pen 1  . VIAGRA 100 MG tablet TAKE 1/2-1 BY MOUTH DAILY AS NEEDED FOR ERECTILE DYSFUNCTION 8 tablet 3  . zinc gluconate 50 MG tablet Take 1 tablet (50 mg total) by mouth daily. 90 tablet 1  . albuterol (PROVENTIL HFA;VENTOLIN HFA) 108 (90 BASE) MCG/ACT inhaler Inhale 2 puffs into the lungs every 6 (six) hours as needed for wheezing or shortness of breath.     No current facility-administered medications on file prior to visit.     Allergies  Allergen Reactions  . Other Itching    Tree nuts     Family History  Problem Relation Age of Onset  . Diabetes Father   . Cancer Neg Hx   . Heart disease Neg Hx   . Hyperlipidemia Neg Hx   . Hypertension Neg Hx   . Diabetes Mother   . Diabetes Brother        one brother  diabetic    BP 100/70 (BP Location: Left Arm, Patient Position: Sitting, Cuff Size: Large)   Pulse 88   Ht 6\' 3"  (1.905 m)   Wt 255 lb (115.7 kg)   SpO2 94%   BMI 31.87 kg/m    Review of Systems Denies n/v.  He has lost 5 more lbs.  He denies hypoglycemia.     Objective:   Physical Exam VITAL SIGNS:  See vs page GENERAL: no distress Pulses: dorsalis pedis intact bilat.   MSK: no deformity of the feet CV: no leg edema Skin:  no ulcer on the feet.  normal color and temp on the feet. Neuro: sensation is intact to touch on the feet.    Lab Results  Component Value Date   HGBA1C 13.1 (A) 11/18/2018       Assessment & Plan:  Insulin-requiring type 2 DM: very poor glycemic control.  Noncompliance with cbg insulin: we discuss risks.  Bariatric surg status: good progress.   Patient Instructions  I have sent a prescription to your pharmacy, to increase the Ozempic again.  Please continue the same other diabetes medications, including insulin.  It is important for your health to not miss it.  check your blood sugar twice a day.  vary the time of day when you check, between  before the 3 meals, and at bedtime.  also check if you have symptoms of your blood sugar  being too high or too low.  please keep a record of the readings and bring it to your next appointment here (or you can bring the meter itself).  You can write it on any piece of paper.  please call us sooner if your blood sugar goes below 70, or if you have a lot of readings over 200.   Please come back for a follow-up appointment in 2 months.

## 2018-11-20 ENCOUNTER — Other Ambulatory Visit: Payer: Self-pay | Admitting: Internal Medicine

## 2018-11-20 DIAGNOSIS — E559 Vitamin D deficiency, unspecified: Secondary | ICD-10-CM

## 2018-11-20 DIAGNOSIS — Z9884 Bariatric surgery status: Secondary | ICD-10-CM

## 2018-11-21 ENCOUNTER — Other Ambulatory Visit: Payer: Self-pay

## 2018-11-21 ENCOUNTER — Encounter: Payer: Self-pay | Admitting: Neurology

## 2018-11-21 ENCOUNTER — Ambulatory Visit: Payer: BC Managed Care – PPO | Admitting: Neurology

## 2018-11-21 VITALS — BP 118/64 | HR 84 | Temp 96.9°F | Ht 75.0 in | Wt 256.4 lb

## 2018-11-21 DIAGNOSIS — E559 Vitamin D deficiency, unspecified: Secondary | ICD-10-CM

## 2018-11-21 DIAGNOSIS — E118 Type 2 diabetes mellitus with unspecified complications: Secondary | ICD-10-CM

## 2018-11-21 DIAGNOSIS — R200 Anesthesia of skin: Secondary | ICD-10-CM | POA: Diagnosis not present

## 2018-11-21 DIAGNOSIS — R202 Paresthesia of skin: Secondary | ICD-10-CM

## 2018-11-21 DIAGNOSIS — Z794 Long term (current) use of insulin: Secondary | ICD-10-CM

## 2018-11-21 NOTE — Progress Notes (Signed)
PATIENT: Mark Oconnor DOB: [birth year]  Chief Complaint  Patient presents with  . Numbness and Tingling    Reports numbness and tingling in his bilateral feet. He is diabetic and her most recent A1C was 13.1.  He has not tried any medication for his symptoms.  Marland Kitchen PCP    Janith Lima, MD     HISTORICAL  Mark Oconnor is a 38 year old male, seen in request by his primary care physician Dr. Scarlette Calico L for evaluation of bilateral feet paresthesia.  I have reviewed and summarized the referring note from the referring physician.  He had past medical history of insulin-dependent diabetes for more than 10 years, on reported control, he does not compliant with his insulin shots, also drinks alcohol regularly, most recent A1c is 13.1, 5 months ago was 13.8, a year ago was 12.9  He complains of bilateral toes, plantar surface paresthesia since 2019, gradually getting worse, he denies gait abnormality, he denies low back pain, no bilateral upper extremity paresthesia, he denies bowel and bladder incontinence.  Laboratory evaluations in July 2020, LDL 146, CBC showed hemoglobin of 15.1, CMP showed elevated glucose 269, creatinine of 1.04, normal ferritin 155, B12 1525, vitamin D is 21 decreased, normal vitamin B1 63 REVIEW OF SYSTEMS: Full 14 system review of systems performed and notable only for as above all other review of systems were negative.  ALLERGIES: Allergies  Allergen Reactions  . Other Itching    Tree nuts     HOME MEDICATIONS: Current Outpatient Medications  Medication Sig Dispense Refill  . B-D UF III MINI PEN NEEDLES 31G X 5 MM MISC USE DAILY AS DIRECTED DX CODE E11.8 100 each 11  . Benzocaine-Menthol (CEPACOL INSTAMAX) 15-20 MG LOZG Use as directed 1 lozenge in the mouth or throat 4 (four) times daily as needed. 20 lozenge 0  . canagliflozin (INVOKANA) 300 MG TABS tablet Take 1 tablet (300 mg total) by mouth daily. 90 tablet 0  . Cholecalciferol (VITAMIN D3)  1.25 MG (50000 UT) CAPS TAKE 1 CAPSULE BY MOUTH ONE TIME PER WEEK 4 capsule 0  . glucose blood (ONETOUCH ULTRA) test strip 1 each by Other route 2 (two) times a day. Use to monitor glucose levels BID 180 each 3  . Icosapent Ethyl (VASCEPA) 1 g CAPS Take 2 capsules (2 g total) by mouth 2 (two) times daily. 360 capsule 1  . Olmesartan-amLODIPine-HCTZ 40-5-12.5 MG TABS TAKE 1 TABLET BY MOUTH EVERY DAY 90 tablet 0  . OneTouch Delica Lancets 60V MISC 1 each by Does not apply route 2 (two) times a day. Use to monitor glucose levels BID 100 each 2  . Semaglutide, 1 MG/DOSE, (OZEMPIC, 1 MG/DOSE,) 2 MG/1.5ML SOPN Inject 1 mg into the skin once a week. 6 pen 3  . sitaGLIPtin-metformin (JANUMET) 50-1000 MG tablet Take 1 tablet by mouth 2 (two) times daily with a meal. 180 tablet 0  . TRESIBA FLEXTOUCH 200 UNIT/ML SOPN INJECT 50 UNITS INTO THE SKIN DAILY. 9 pen 1  . VIAGRA 100 MG tablet TAKE 1/2-1 BY MOUTH DAILY AS NEEDED FOR ERECTILE DYSFUNCTION 8 tablet 3  . zinc gluconate 50 MG tablet Take 1 tablet (50 mg total) by mouth daily. 90 tablet 1  . albuterol (PROVENTIL HFA;VENTOLIN HFA) 108 (90 BASE) MCG/ACT inhaler Inhale 2 puffs into the lungs every 6 (six) hours as needed for wheezing or shortness of breath.     No current facility-administered medications for this visit.  PAST MEDICAL HISTORY: Past Medical History:  Diagnosis Date  . Allergy   . Diabetes mellitus   . GERD (gastroesophageal reflux disease)   . Hyperlipidemia   . Hypertension   . Morbid obesity (HCC)   . Numbness and tingling of both feet   . Sleep apnea 05-21-13   denies.Stop/Bang score =4 with PAT visit    PAST SURGICAL HISTORY: Past Surgical History:  Procedure Laterality Date  . BREATH TEK H PYLORI N/A 07/12/2012   Procedure: BREATH TEK H PYLORI;  Surgeon: Mariella SaaBenjamin T Hoxworth, MD;  [facility];  Service: General;  Laterality: N/A;  . GASTRIC ROUX-EN-Y N/A 05/26/2013   Procedure: LAPAROSCOPIC ROUX-EN-Y GASTRIC  BYPASS WITH UPPER ENDOSCOPY;  Surgeon: Mariella SaaBenjamin T Hoxworth, MD;  Location: [facility];  Service: General;  Laterality: N/A;    FAMILY HISTORY: Family History  Problem Relation Age of Onset  . Diabetes Father   . Diabetes Mother   . Diabetes Brother        one brother  diabetic  . Cancer Neg Hx   . Heart disease Neg Hx   . Hyperlipidemia Neg Hx   . Hypertension Neg Hx     SOCIAL HISTORY: Social History   Socioeconomic History  . Marital status: Married    Spouse name: Not on file  . Number of children: 0  . Years of education: some college  . Highest education level: Not on file  Occupational History  . Not on file  Social Needs  . Financial resource strain: Not on file  . Food insecurity    Worry: Not on file    Inability: Not on file  . Transportation needs    Medical: Not on file    Non-medical: Not on file  Tobacco Use  . Smoking status: Former Smoker    Packs/day: 0.50    Years: 15.00    Pack years: 7.50    Types: Cigarettes    Quit date: 12/07/2011    Years since quitting: 6.9  . Smokeless tobacco: Never Used  Substance and Sexual Activity  . Alcohol use: Yes    Alcohol/week: 10.0 standard drinks    Types: 10 Cans of beer per week    Comment: occasional to rare  . Drug use: No  . Sexual activity: Yes  Lifestyle  . Physical activity    Days per week: Not on file    Minutes per session: Not on file  . Stress: Not on file  Relationships  . Social Musicianconnections    Talks on phone: Not on file    Gets together: Not on file    Attends religious service: Not on file    Active member of club or organization: Not on file    Attends meetings of clubs or organizations: Not on file    Relationship status: Not on file  . Intimate partner violence    Fear of current or ex partner: Not on file    Emotionally abused: Not on file    Physically abused: Not on file    Forced sexual activity: Not on file  Other Topics Concern  . Not on file  Social History Narrative    Lives at home with his wife.   Right-handed.   3 cups caffeine per day.     PHYSICAL EXAM   Vitals:   11/21/18 1515  BP: 118/64  Pulse: 84  Temp: (!) 96.9 F (36.1 C)  Weight: 256 lb 6.4 oz (116.3 kg)  Height: 6\' 3"  (  1.905 m)    Not recorded      Body mass index is 32.05 kg/m.  PHYSICAL EXAMNIATION:  Gen: NAD, conversant, well nourised, obese, well groomed                     Cardiovascular: Regular rate rhythm, no peripheral edema, warm, nontender. Eyes: Conjunctivae clear without exudates or hemorrhage Neck: Supple, no carotid bruits. Pulmonary: Clear to auscultation bilaterally   NEUROLOGICAL EXAM:  MENTAL STATUS: Speech:    Speech is normal; fluent and spontaneous with normal comprehension.  Cognition:     Orientation to time, place and person     Normal recent and remote memory     Normal Attention span and concentration     Normal Language, naming, repeating,spontaneous speech     Fund of knowledge   CRANIAL NERVES: CN II: Visual fields are full to confrontation.. Pupils are round equal and briskly reactive to light. CN III, IV, VI: extraocular movement are normal. No ptosis. CN V: Facial sensation is intact to pinprick in all 3 divisions bilaterally. Corneal responses are intact.  CN VII: Face is symmetric with normal eye closure and smile. CN VIII: Hearing is normal to rubbing fingers CN IX, X: Palate elevates symmetrically. Phonation is normal. CN XI: Head turning and shoulder shrug are intact CN XII: Tongue is midline with normal movements and no atrophy.  MOTOR: There is no pronator drift of out-stretched arms. Muscle bulk and tone are normal. Muscle strength is normal.  REFLEXES: Reflexes are 1 and symmetric at the biceps, triceps, knees, and ankles. Plantar responses are flexor.  SENSORY: Length dependent decreased to to light touch, pinprick, and vibratory sensation at toes  COORDINATION: Rapid alternating movements and fine finger  movements are intact. There is no dysmetria on finger-to-nose and heel-knee-shin.    GAIT/STANCE: Posture is normal. Gait is steady with normal steps, base, arm swing, and turning. Heel and toe walking are normal. Tandem gait is normal.  Romberg is absent.   DIAGNOSTIC DATA (LABS, IMAGING, TESTING) - I reviewed patient records, labs, notes, testing and imaging myself where available.   ASSESSMENT AND PLAN  Mark Oconnor is a 38 y.o. male   Diabetes, insulin-dependent poorly controlled Bilateral feet paresthesia  Is consistent with diabetic peripheral neuropathy  No other treatable etiology found  EMG nerve conduction study for baseline   Levert FeinsteinYijun Shania Bjelland, M.D. Ph.D.  [facility] [address], [address] [place], [place] [address] Ph: [phone]) [phone] Fax: [phone]  CC: Etta GrandchildJones, Thomas L, MD

## 2018-12-06 ENCOUNTER — Telehealth: Payer: Self-pay

## 2018-12-06 NOTE — Telephone Encounter (Signed)
PA initiated today through Cover My Meds for Ozempic.   Dahlia Bailiff Key: [id number]  PA Case ID: [id number]  Rx #: [id number] Need help? Call us at [phone] Outcome: Approved today CaseId: [id number] Status: Approved Review Type: Prior Auth  Coverage Start Date: 11/06/2018 Coverage End Date: 12/05/2021; Drug: Ozempic (1 MG/DOSE) 2MG /1.5ML pen-injectors Form: [employer] Electronic PA Form Original Claim Info: 21 Your request has been approved CaseId: [id number]; Status: Approved; Review Type: Prior Auth; Coverage Start Date: 11/06/2018; Coverage End Date: 12/05/2021;  [facility] was attempting to fill Rx for more than ordered thus resulting in a request for PA. Insurance will cover 2 pens/30days

## 2018-12-10 ENCOUNTER — Other Ambulatory Visit: Payer: Self-pay | Admitting: Internal Medicine

## 2018-12-10 DIAGNOSIS — E118 Type 2 diabetes mellitus with unspecified complications: Secondary | ICD-10-CM

## 2018-12-10 DIAGNOSIS — Z794 Long term (current) use of insulin: Secondary | ICD-10-CM

## 2018-12-10 DIAGNOSIS — I10 Essential (primary) hypertension: Secondary | ICD-10-CM

## 2019-01-15 ENCOUNTER — Encounter: Payer: BC Managed Care – PPO | Admitting: Neurology

## 2019-01-15 ENCOUNTER — Other Ambulatory Visit: Payer: Self-pay | Admitting: Internal Medicine

## 2019-01-15 DIAGNOSIS — Z9884 Bariatric surgery status: Secondary | ICD-10-CM

## 2019-01-15 DIAGNOSIS — E559 Vitamin D deficiency, unspecified: Secondary | ICD-10-CM

## 2019-01-22 ENCOUNTER — Ambulatory Visit (INDEPENDENT_AMBULATORY_CARE_PROVIDER_SITE_OTHER): Payer: BC Managed Care – PPO | Admitting: Endocrinology

## 2019-01-22 ENCOUNTER — Other Ambulatory Visit: Payer: Self-pay

## 2019-01-22 ENCOUNTER — Encounter: Payer: Self-pay | Admitting: Endocrinology

## 2019-01-22 VITALS — BP 110/68 | HR 84 | Ht 75.0 in | Wt 259.6 lb

## 2019-01-22 DIAGNOSIS — Z794 Long term (current) use of insulin: Secondary | ICD-10-CM | POA: Diagnosis not present

## 2019-01-22 DIAGNOSIS — E118 Type 2 diabetes mellitus with unspecified complications: Secondary | ICD-10-CM | POA: Diagnosis not present

## 2019-01-22 LAB — POCT GLYCOSYLATED HEMOGLOBIN (HGB A1C): Hemoglobin A1C: 12.9 % — AB (ref 4.0–5.6)

## 2019-01-22 MED ORDER — TRESIBA FLEXTOUCH 200 UNIT/ML ~~LOC~~ SOPN: 76.0000 [IU] | PEN_INJECTOR | Freq: Every day | SUBCUTANEOUS | 11 refills | Status: DC

## 2019-01-22 NOTE — Patient Instructions (Addendum)
I have sent a prescription to your pharmacy, to increase the Tresiba to 75 units daily.  It is important for your health to not miss it.  It is best to never miss the medication.  However, if you do miss it, next best is to make it up when you can.  Please continue the same other diabetes medications.   check your blood sugar twice a day.  vary the time of day when you check, between before the 3 meals, and at bedtime.  also check if you have symptoms of your blood sugar being too high or too low.  please keep a record of the readings and bring it to your next appointment here (or you can bring the meter itself).  You can write it on any piece of paper.  please call us sooner if your blood sugar goes below 70, or if you have a lot of readings over 200.   Please come back for a follow-up appointment in 2 months.

## 2019-01-22 NOTE — Progress Notes (Signed)
Subjective:    Patient ID: Mark Oconnor, male    DOB: [birth year], 38 y.o.   MRN: [id number]  HPI Pt returns for f/u of diabetes mellitus: DM type: Insulin-requiring type 2 Dx'ed: 2011 Complications: none Therapy: insulin since dx, Ozempic, and 3 oral meds.   DKA: never Severe hypoglycemia: never.  Pancreatitis: never.   Other: he had gastric bypass surgery in 2015--he has lost approx 90 lbs since then, but pt says this precipitated excessive alcohol intake; he declines multiple daily injections.    Interval history: no cbg record, but states cbg's vary from 230-270.  He still sometimes misses meds--especially the tresiba.  pt states she feels well in general.   Past Medical History:  Diagnosis Date  . Allergy   . Diabetes mellitus   . GERD (gastroesophageal reflux disease)   . Hyperlipidemia   . Hypertension   . Morbid obesity (HCC)   . Numbness and tingling of both feet   . Sleep apnea 05-21-13   denies.Stop/Bang score =4 with PAT visit    Past Surgical History:  Procedure Laterality Date  . BREATH TEK H PYLORI N/A 07/12/2012   Procedure: BREATH TEK H PYLORI;  Surgeon: Mariella Saa, MD;  Location[facility];  Service: General;  Laterality: N/A;  . GASTRIC ROUX-EN-Y N/A 05/26/2013   Procedure: LAPAROSCOPIC ROUX-EN-Y GASTRIC BYPASS WITH UPPER ENDOSCOPY;  Surgeon: Mariella Saa, MD;  Location: [facility];  Service: General;  Laterality: N/A;    Social History   Socioeconomic History  . Marital status: Married    Spouse name: Not on file  . Number of children: 0  . Years of education: some college  . Highest education level: Not on file  Occupational History  . Not on file  Social Needs  . Financial resource strain: Not on file  . Food insecurity    Worry: Not on file    Inability: Not on file  . Transportation needs    Medical: Not on file    Non-medical: Not on file  Tobacco Use  . Smoking status: Former Smoker    Packs/day: 0.50    Years: 15.00    Pack years: 7.50    Types: Cigarettes    Quit date: 12/07/2011    Years since quitting: 7.1  . Smokeless tobacco: Never Used  Substance and Sexual Activity  . Alcohol use: Yes    Alcohol/week: 10.0 standard drinks    Types: 10 Cans of beer per week    Comment: occasional to rare  . Drug use: No  . Sexual activity: Yes  Lifestyle  . Physical activity    Days per week: Not on file    Minutes per session: Not on file  . Stress: Not on file  Relationships  . Social Musician on phone: Not on file    Gets together: Not on file    Attends religious service: Not on file    Active member of club or organization: Not on file    Attends meetings of clubs or organizations: Not on file    Relationship status: Not on file  . Intimate partner violence    Fear of current or ex partner: Not on file    Emotionally abused: Not on file    Physically abused: Not on file    Forced sexual activity: Not on file  Other Topics Concern  . Not on file  Social History Narrative   Lives at home with his wife.  Right-handed.   3 cups caffeine per day.    Current Outpatient Medications on File Prior to Visit  Medication Sig Dispense Refill  . B-D UF III MINI PEN NEEDLES 31G X 5 MM MISC USE DAILY AS DIRECTED DX CODE E11.8 100 each 11  . Benzocaine-Menthol (CEPACOL INSTAMAX) 15-20 MG LOZG Use as directed 1 lozenge in the mouth or throat 4 (four) times daily as needed. 20 lozenge 0  . canagliflozin (INVOKANA) 300 MG TABS tablet Take 1 tablet (300 mg total) by mouth daily. 90 tablet 0  . Cholecalciferol (VITAMIN D3) 1.25 MG (50000 UT) CAPS TAKE 1 CAPSULE BY MOUTH ONE TIME PER WEEK 4 capsule 0  . glucose blood (ONETOUCH ULTRA) test strip 1 each by Other route 2 (two) times a day. Use to monitor glucose levels BID 180 each 3  . Icosapent Ethyl (VASCEPA) 1 g CAPS Take 2 capsules (2 g total) by mouth 2 (two) times daily. 360 capsule 1  . Olmesartan-amLODIPine-HCTZ 40-5-12.5 MG TABS TAKE 1 TABLET BY  MOUTH EVERY DAY 90 tablet 0  . OneTouch Delica Lancets 30G MISC 1 each by Does not apply route 2 (two) times a day. Use to monitor glucose levels BID 100 each 2  . Semaglutide, 1 MG/DOSE, (OZEMPIC, 1 MG/DOSE,) 2 MG/1.5ML SOPN Inject 1 mg into the skin once a week. 6 pen 3  . sitaGLIPtin-metformin (JANUMET) 50-1000 MG tablet Take 1 tablet by mouth 2 (two) times daily with a meal. 180 tablet 0  . VIAGRA 100 MG tablet TAKE 1/2-1 BY MOUTH DAILY AS NEEDED FOR ERECTILE DYSFUNCTION 8 tablet 3  . zinc gluconate 50 MG tablet Take 1 tablet (50 mg total) by mouth daily. 90 tablet 1  . albuterol (PROVENTIL HFA;VENTOLIN HFA) 108 (90 BASE) MCG/ACT inhaler Inhale 2 puffs into the lungs every 6 (six) hours as needed for wheezing or shortness of breath.     No current facility-administered medications on file prior to visit.     Allergies  Allergen Reactions  . Other Itching    Tree nuts     Family History  Problem Relation Age of Onset  . Diabetes Father   . Diabetes Mother   . Diabetes Brother        one brother  diabetic  . Cancer Neg Hx   . Heart disease Neg Hx   . Hyperlipidemia Neg Hx   . Hypertension Neg Hx     BP 110/68 (BP Location: Left Arm, Patient Position: Sitting, Cuff Size: Large)   Pulse 84   Ht 6\' 3"  (1.905 m)   Wt 259 lb 9.6 oz (117.8 kg)   SpO2 98%   BMI 32.45 kg/m    Review of Systems He denies hypoglycemia.  He has gained 4 lbs since last ov.      Objective:   Physical Exam VITAL SIGNS:  See vs page GENERAL: no distress Pulses: dorsalis pedis intact bilat.   MSK: no deformity of the feet CV: no leg edema Skin:  no ulcer on the feet.  normal color and temp on the feet. Neuro: sensation is intact to touch on the feet  Lab Results  Component Value Date   HGBA1C 12.9 (A) 01/22/2019       Assessment & Plan:  Insulin-requiring type 2 DM: very poor glycemic control.   Noncompliance with cbg recording and insulin.  He is not a candidate for multiple daily  injections.   Obesity: on 1 hand, he should minimize meds.  On the  other, we'll continue those which aid in weight loss.   Patient Instructions  I have sent a prescription to your pharmacy, to increase the Tresiba to 75 units daily.  It is important for your health to not miss it.  It is best to never miss the medication.  However, if you do miss it, next best is to make it up when you can.  Please continue the same other diabetes medications.   check your blood sugar twice a day.  vary the time of day when you check, between before the 3 meals, and at bedtime.  also check if you have symptoms of your blood sugar being too high or too low.  please keep a record of the readings and bring it to your next appointment here (or you can bring the meter itself).  You can write it on any piece of paper.  please call us sooner if your blood sugar goes below 70, or if you have a lot of readings over 200.   Please come back for a follow-up appointment in 2 months.

## 2019-03-02 ENCOUNTER — Other Ambulatory Visit: Payer: Self-pay | Admitting: Internal Medicine

## 2019-03-02 DIAGNOSIS — Z794 Long term (current) use of insulin: Secondary | ICD-10-CM

## 2019-03-02 DIAGNOSIS — E118 Type 2 diabetes mellitus with unspecified complications: Secondary | ICD-10-CM

## 2019-03-03 ENCOUNTER — Telehealth: Payer: Self-pay

## 2019-03-03 NOTE — Telephone Encounter (Signed)
PA initiated today through Cover My Meds for Invokana. Received notification that PA for Invokana has been approved.   Dahlia Bailiff Key: [id number] - PA Case ID: [id number] - Rx #: [id number] Need help? Call us at [phone] Outcome Approvedtoday CaseId:[id number];Status:Approved;Review Type:Prior Auth;Coverage Start Date:02/01/2019;Coverage End Date:03/02/2020; Drug Invokana 300MG  tablets Form [employer] Electronic PA Form Original Claim Info 75

## 2019-03-05 DIAGNOSIS — R7309 Other abnormal glucose: Secondary | ICD-10-CM | POA: Diagnosis not present

## 2019-03-05 DIAGNOSIS — E08649 Diabetes mellitus due to underlying condition with hypoglycemia without coma: Secondary | ICD-10-CM | POA: Diagnosis not present

## 2019-03-05 DIAGNOSIS — H52229 Regular astigmatism, unspecified eye: Secondary | ICD-10-CM | POA: Diagnosis not present

## 2019-03-05 DIAGNOSIS — H538 Other visual disturbances: Secondary | ICD-10-CM | POA: Diagnosis not present

## 2019-03-06 LAB — HM DIABETES EYE EXAM

## 2019-03-13 ENCOUNTER — Ambulatory Visit (INDEPENDENT_AMBULATORY_CARE_PROVIDER_SITE_OTHER): Payer: BC Managed Care – PPO | Admitting: Internal Medicine

## 2019-03-13 ENCOUNTER — Other Ambulatory Visit: Payer: Self-pay | Admitting: Internal Medicine

## 2019-03-13 ENCOUNTER — Encounter: Payer: Self-pay | Admitting: Internal Medicine

## 2019-03-13 ENCOUNTER — Other Ambulatory Visit: Payer: Self-pay

## 2019-03-13 ENCOUNTER — Other Ambulatory Visit (INDEPENDENT_AMBULATORY_CARE_PROVIDER_SITE_OTHER): Payer: BC Managed Care – PPO

## 2019-03-13 VITALS — BP 136/88 | HR 69 | Temp 97.9°F | Resp 16 | Ht 75.0 in | Wt 258.0 lb

## 2019-03-13 DIAGNOSIS — E559 Vitamin D deficiency, unspecified: Secondary | ICD-10-CM

## 2019-03-13 DIAGNOSIS — E781 Pure hyperglyceridemia: Secondary | ICD-10-CM

## 2019-03-13 DIAGNOSIS — I10 Essential (primary) hypertension: Secondary | ICD-10-CM

## 2019-03-13 DIAGNOSIS — E785 Hyperlipidemia, unspecified: Secondary | ICD-10-CM

## 2019-03-13 DIAGNOSIS — E60 Dietary zinc deficiency: Secondary | ICD-10-CM

## 2019-03-13 DIAGNOSIS — E118 Type 2 diabetes mellitus with unspecified complications: Secondary | ICD-10-CM

## 2019-03-13 DIAGNOSIS — Z794 Long term (current) use of insulin: Secondary | ICD-10-CM

## 2019-03-13 DIAGNOSIS — Z9884 Bariatric surgery status: Secondary | ICD-10-CM

## 2019-03-13 LAB — BASIC METABOLIC PANEL
BUN: 14 mg/dL (ref 6–23)
CO2: 26 mEq/L (ref 19–32)
Calcium: 9.2 mg/dL (ref 8.4–10.5)
Chloride: 97 mEq/L (ref 96–112)
Creatinine, Ser: 1.01 mg/dL (ref 0.40–1.50)
GFR: 99.67 mL/min (ref >60.00)
Glucose, Bld: 359 mg/dL — ABNORMAL HIGH (ref 70–99)
Potassium: 3.9 mEq/L (ref 3.5–5.1)
Sodium: 131 mEq/L — ABNORMAL LOW (ref 135–145)

## 2019-03-13 LAB — LDL CHOLESTEROL, DIRECT: Direct LDL: 148 mg/dL

## 2019-03-13 LAB — POCT GLYCOSYLATED HEMOGLOBIN (HGB A1C): Hemoglobin A1C: 13.3 % — AB (ref 4.0–5.6)

## 2019-03-13 LAB — LIPID PANEL
Cholesterol: 254 mg/dL — ABNORMAL HIGH (ref 0–200)
HDL: 52.5 mg/dL (ref >39.00)
NonHDL: 201.97
Total CHOL/HDL Ratio: 5
Triglycerides: 281 mg/dL — ABNORMAL HIGH (ref 0.0–149.0)
VLDL: 56.2 mg/dL — ABNORMAL HIGH (ref 0.0–40.0)

## 2019-03-13 LAB — POCT GLUCOSE (DEVICE FOR HOME USE): Glucose Fasting, POC: 350 mg/dL — AB (ref 70–99)

## 2019-03-13 LAB — VITAMIN D 25 HYDROXY (VIT D DEFICIENCY, FRACTURES): VITD: 36.46 ng/mL (ref 30.00–100.00)

## 2019-03-13 MED ORDER — OLMESARTAN-AMLODIPINE-HCTZ 40-5-12.5 MG PO TABS: 1.0000 | ORAL_TABLET | Freq: Every day | ORAL | 0 refills | Status: DC

## 2019-03-13 MED ORDER — HUMALOG KWIKPEN 200 UNIT/ML ~~LOC~~ SOPN: 10.0000 [IU] | PEN_INJECTOR | Freq: Three times a day (TID) | SUBCUTANEOUS | 1 refills | Status: DC

## 2019-03-13 NOTE — Patient Instructions (Signed)

## 2019-03-13 NOTE — Progress Notes (Signed)
Subjective:  Patient ID: Mark Oconnor, male    DOB: 1980/12/21  Age: 38 y.o. MRN: [id number]  CC: Hyperlipidemia, Hypertension, and Diabetes   HPI [name] presents for f/up - He is concerned that his blood pressure sugar is not adequately well controlled.  Over the last few weeks it has been consistently over 200.  He admits that he cannot remember the last time he used his basal insulin.  He has not been using the GLP-1 agonist because he thought it caused weight gain.  He will not take Metformin because it has previously caused diarrhea.  He is not willing to get a flu shot today.  Outpatient Medications Prior to Visit  Medication Sig Dispense Refill  . B-D UF III MINI PEN NEEDLES 31G X 5 MM MISC USE DAILY AS DIRECTED DX CODE E11.8 100 each 11  . Cholecalciferol (VITAMIN D3) 1.25 MG (50000 UT) CAPS TAKE 1 CAPSULE BY MOUTH ONE TIME PER WEEK 4 capsule 0  . glucose blood (ONETOUCH ULTRA) test strip 1 each by Other route 2 (two) times a day. Use to monitor glucose levels BID 180 each 3  . Insulin Degludec (TRESIBA FLEXTOUCH) 200 UNIT/ML SOPN Inject 76 Units into the skin daily. And pen needles 1/day 12 pen 11  . OneTouch Delica Lancets 30G MISC 1 each by Does not apply route 2 (two) times a day. Use to monitor glucose levels BID 100 each 2  . Semaglutide, 1 MG/DOSE, (OZEMPIC, 1 MG/DOSE,) 2 MG/1.5ML SOPN Inject 1 mg into the skin once a week. 6 pen 3  . VIAGRA 100 MG tablet TAKE 1/2-1 BY MOUTH DAILY AS NEEDED FOR ERECTILE DYSFUNCTION 8 tablet 3  . zinc gluconate 50 MG tablet Take 1 tablet (50 mg total) by mouth daily. 90 tablet 1  . Icosapent Ethyl (VASCEPA) 1 g CAPS Take 2 capsules (2 g total) by mouth 2 (two) times daily. 360 capsule 1  . Olmesartan-amLODIPine-HCTZ 40-5-12.5 MG TABS TAKE 1 TABLET BY MOUTH EVERY DAY 90 tablet 0  . sitaGLIPtin-metformin (JANUMET) 50-1000 MG tablet Take 1 tablet by mouth 2 (two) times daily with a meal. 180 tablet 0  . albuterol (PROVENTIL  HFA;VENTOLIN HFA) 108 (90 BASE) MCG/ACT inhaler Inhale 2 puffs into the lungs every 6 (six) hours as needed for wheezing or shortness of breath.    . Benzocaine-Menthol (CEPACOL INSTAMAX) 15-20 MG LOZG Use as directed 1 lozenge in the mouth or throat 4 (four) times daily as needed. (Patient not taking: Reported on 03/13/2019) 20 lozenge 0  . canagliflozin (INVOKANA) 300 MG TABS tablet Take 1 tablet (300 mg total) by mouth daily. 90 tablet 0   No facility-administered medications prior to visit.     ROS Review of Systems  Constitutional: Negative.  Negative for appetite change, diaphoresis, fatigue and unexpected weight change.  HENT: Negative.   Eyes: Negative for visual disturbance.  Respiratory: Negative for cough, chest tightness, shortness of breath and wheezing.   Cardiovascular: Negative for palpitations and leg swelling.  Gastrointestinal: Negative for abdominal pain, constipation, diarrhea and vomiting.  Endocrine: Negative for polydipsia, polyphagia and polyuria.  Genitourinary: Negative.  Negative for difficulty urinating.  Musculoskeletal: Negative for arthralgias and myalgias.  Skin: Negative.  Negative for color change and pallor.  Neurological: Negative.  Negative for dizziness, weakness and light-headedness.  Hematological: Negative for adenopathy. Does not bruise/bleed easily.  Psychiatric/Behavioral: Negative.     Objective:  BP 136/88 (BP Location: Left Arm, Patient Position: Sitting, Cuff Size: Large)  Pulse 69   Temp 97.9 F (36.6 C) (Oral)   Resp 16   Ht 6\' 3"  (1.905 m)   Wt 258 lb (117 kg)   SpO2 96%   BMI 32.25 kg/m   BP Readings from Last 3 Encounters:  03/13/19 136/88  01/22/19 110/68  11/21/18 118/64    Wt Readings from Last 3 Encounters:  03/13/19 258 lb (117 kg)  01/22/19 259 lb 9.6 oz (117.8 kg)  11/21/18 256 lb 6.4 oz (116.3 kg)    Physical Exam Vitals signs reviewed.  Constitutional:      Appearance: Normal appearance. He is obese.   HENT:     Nose: Nose normal.     Mouth/Throat:     Mouth: Mucous membranes are moist.  Eyes:     General: No scleral icterus.    Conjunctiva/sclera: Conjunctivae normal.  Neck:     Musculoskeletal: Neck supple.  Cardiovascular:     Rate and Rhythm: Normal rate and regular rhythm.     Heart sounds: No murmur.  Pulmonary:     Effort: Pulmonary effort is normal.     Breath sounds: No stridor. No wheezing, rhonchi or rales.  Abdominal:     General: Abdomen is protuberant. Bowel sounds are normal. There is no distension.     Palpations: Abdomen is soft. There is no hepatomegaly.     Tenderness: There is no abdominal tenderness.  Musculoskeletal: Normal range of motion.     Right lower leg: No edema.     Left lower leg: No edema.  Lymphadenopathy:     Cervical: No cervical adenopathy.  Skin:    General: Skin is warm and dry.     Coloration: Skin is not pale.  Neurological:     General: No focal deficit present.     Mental Status: He is alert.  Psychiatric:        Mood and Affect: Mood normal.        Behavior: Behavior normal.     Lab Results  Component Value Date   WBC 7.1 05/30/2018   HGB 15.1 05/30/2018   HCT 45.9 05/30/2018   PLT 226.0 05/30/2018   GLUCOSE 359 (H) 03/13/2019   CHOL 254 (H) 03/13/2019   TRIG 281.0 (H) 03/13/2019   HDL 52.50 03/13/2019   LDLDIRECT 148.0 03/13/2019   LDLCALC 94 06/19/2017   ALT 21 05/30/2018   AST 19 05/30/2018   NA 131 (L) 03/13/2019   K 3.9 03/13/2019   CL 97 03/13/2019   CREATININE 1.01 03/13/2019   BUN 14 03/13/2019   CO2 26 03/13/2019   TSH 1.22 05/30/2018   PSA 0.53 04/26/2011   HGBA1C 13.3 (A) 03/13/2019   MICROALBUR 1.0 05/30/2018    No results found.  Assessment & Plan:   Julis was seen today for hyperlipidemia, hypertension and diabetes.  Diagnoses and all orders for this visit:  Essential hypertension, benign- His blood pressure is adequately well controlled. -     Basic metabolic panel; Future -      Olmesartan-amLODIPine-HCTZ 40-5-12.5 MG TABS; Take 1 tablet by mouth daily.  Pure hyperglyceridemia- His triglyceride levels remain elevated.  I have asked him to restart Vascepa and to improve his lifestyle modifications. -     Lipid panel; Future  Vitamin D deficiency -     Vitamin D 25 hydroxy; Future  Hyperlipidemia with target LDL less than 100- Statin therapy is not indicated since he is under the age of 52. -     Lipid  panel; Future  Zinc deficiency -     Zinc; Future  Type 2 diabetes mellitus with complication, with long-term current use of insulin (HCC)- His blood sugar is not well controlled and he has dilutional hyponatremia due to noncompliance.  He agrees to start using the basal insulin as directed.  I have also asked him to use short acting insulin with each meal and anytime his blood sugar is above 200.  Additionally, I have informed him that Ozempic will likely help him lose weight and I recommend that he start using it as directed.  I have also asked him to follow-up with diabetic education and his endocrinologist. -     Ambulatory referral to Endocrinology -     Ambulatory referral to diabetic education -     Insulin Lispro (HUMALOG KWIKPEN) 200 UNIT/ML SOPN; Inject 10 Units into the skin 3 (three) times daily with meals. -     Olmesartan-amLODIPine-HCTZ 40-5-12.5 MG TABS; Take 1 tablet by mouth daily. -     POCT HgB A1C -     POCT Glucose (Device for Home Use)   I have discontinued Cipriano MileWinston C. Lord's canagliflozin, Benzocaine-Menthol, and sitaGLIPtin-metformin. I have also changed his Olmesartan-amLODIPine-HCTZ. Additionally, I am having him start on HumaLOG KwikPen. Lastly, I am having him maintain his albuterol, B-D UF III MINI PEN NEEDLES, zinc gluconate, Viagra, OneTouch Delica Lancets 30G, Ozempic (1 MG/DOSE), OneTouch Ultra, Tresiba FlexTouch, Vitamin D3, and Vascepa.  Meds ordered this encounter  Medications  . Insulin Lispro (HUMALOG KWIKPEN) 200 UNIT/ML SOPN     Sig: Inject 10 Units into the skin 3 (three) times daily with meals.    Dispense:  9 mL    Refill:  1  . Olmesartan-amLODIPine-HCTZ 40-5-12.5 MG TABS    Sig: Take 1 tablet by mouth daily.    Dispense:  90 tablet    Refill:  0  . Icosapent Ethyl (VASCEPA) 1 g CAPS    Sig: Take 2 capsules (2 g total) by mouth 2 (two) times daily.    Dispense:  360 capsule    Refill:  1     Follow-up: Return in about 3 months (around 06/13/2019).  Sanda Lingerhomas Xenia Nile, MD

## 2019-03-14 MED ORDER — VASCEPA 1 G PO CAPS: 2.0000 | ORAL_CAPSULE | Freq: Two times a day (BID) | ORAL | 1 refills | Status: AC

## 2019-03-15 LAB — ZINC: Zinc: 73 ug/dL (ref 60–130)

## 2019-03-17 ENCOUNTER — Encounter: Payer: BC Managed Care – PPO | Admitting: Neurology

## 2019-03-21 ENCOUNTER — Other Ambulatory Visit: Payer: Self-pay

## 2019-03-24 ENCOUNTER — Ambulatory Visit: Payer: BC Managed Care – PPO | Admitting: Endocrinology

## 2019-03-25 ENCOUNTER — Ambulatory Visit: Payer: BC Managed Care – PPO | Admitting: Endocrinology

## 2019-04-09 ENCOUNTER — Encounter: Payer: Self-pay | Admitting: Endocrinology

## 2019-04-09 ENCOUNTER — Ambulatory Visit (INDEPENDENT_AMBULATORY_CARE_PROVIDER_SITE_OTHER): Payer: BC Managed Care – PPO | Admitting: Endocrinology

## 2019-04-09 ENCOUNTER — Other Ambulatory Visit: Payer: Self-pay

## 2019-04-09 VITALS — Ht 75.0 in

## 2019-04-09 DIAGNOSIS — E118 Type 2 diabetes mellitus with unspecified complications: Secondary | ICD-10-CM | POA: Diagnosis not present

## 2019-04-09 DIAGNOSIS — Z794 Long term (current) use of insulin: Secondary | ICD-10-CM | POA: Diagnosis not present

## 2019-04-09 NOTE — Patient Instructions (Addendum)
You can stay off the humalog, but please continue the same other diabetes medications.   check your blood sugar twice a day.  vary the time of day when you check, between before the 3 meals, and at bedtime.  also check if you have symptoms of your blood sugar being too high or too low.  please keep a record of the readings and bring it to your next appointment here (or you can bring the meter itself).  You can write it on any piece of paper.  please call us sooner if your blood sugar goes below 70, or if you have a lot of readings over 200.   Please come back for a follow-up appointment in 2 months.

## 2019-04-09 NOTE — Progress Notes (Signed)
Subjective:    Patient ID: Mark Oconnor, male    DOB: 01/13/1981, 38 y.o.   MRN: [id number]  HPI  telehealth visit today via doxy video visit.  Alternatives to telehealth are presented to this patient, and the patient agrees to the telehealth visit.  Pt is advised of the cost of the visit, and agrees to this, also.   Patient is at home, and I am at the office.   Persons attending the telehealth visit: the patient and I.  Pt returns for f/u of diabetes mellitus: DM type: Insulin-requiring type 2 Dx'ed: 2011 Complications: none Therapy: insulin since dx, and Ozempic.    DKA: never Severe hypoglycemia: never.  Pancreatitis: never.   Other: he had gastric bypass surgery in 2015--he has lost approx 90 lbs since then, but pt says this precipitated excessive alcohol intake; he declines multiple daily injections.    Interval history: he has coronavirus, but has not been rx'ed with steroids.  He says cbg varies from 75-220.  Pt says he takes meds as rx'ed now, except he does not take humalog.   Past Medical History:  Diagnosis Date  . Allergy   . Diabetes mellitus   . GERD (gastroesophageal reflux disease)   . Hyperlipidemia   . Hypertension   . Morbid obesity (HCC)   . Numbness and tingling of both feet   . Sleep apnea 05-21-13   denies.Stop/Bang score =4 with PAT visit    Past Surgical History:  Procedure Laterality Date  . BREATH TEK H PYLORI N/A 07/12/2012   Procedure: BREATH TEK H PYLORI;  Surgeon: Mariella SaaBenjamin T Hoxworth, MD;  [facility];  Service: General;  Laterality: N/A;  . GASTRIC ROUX-EN-Y N/A 05/26/2013   Procedure: LAPAROSCOPIC ROUX-EN-Y GASTRIC BYPASS WITH UPPER ENDOSCOPY;  Surgeon: Mariella SaaBenjamin T Hoxworth, MD;  Location: [facility];  Service: General;  Laterality: N/A;    Social History   Socioeconomic History  . Marital status: Married    Spouse name: Not on file  . Number of children: 0  . Years of education: some college  . Highest education level: Not on  file  Occupational History  . Not on file  Social Needs  . Financial resource strain: Not on file  . Food insecurity    Worry: Not on file    Inability: Not on file  . Transportation needs    Medical: Not on file    Non-medical: Not on file  Tobacco Use  . Smoking status: Former Smoker    Packs/day: 0.50    Years: 15.00    Pack years: 7.50    Types: Cigarettes    Quit date: 12/07/2011    Years since quitting: 7.3  . Smokeless tobacco: Never Used  Substance and Sexual Activity  . Alcohol use: Yes    Alcohol/week: 10.0 standard drinks    Types: 10 Cans of beer per week    Comment: occasional to rare  . Drug use: No  . Sexual activity: Yes  Lifestyle  . Physical activity    Days per week: Not on file    Minutes per session: Not on file  . Stress: Not on file  Relationships  . Social Musicianconnections    Talks on phone: Not on file    Gets together: Not on file    Attends religious service: Not on file    Active member of club or organization: Not on file    Attends meetings of clubs or organizations: Not on file  Relationship status: Not on file  . Intimate partner violence    Fear of current or ex partner: Not on file    Emotionally abused: Not on file    Physically abused: Not on file    Forced sexual activity: Not on file  Other Topics Concern  . Not on file  Social History Narrative   Lives at home with his wife.   Right-handed.   3 cups caffeine per day.    Current Outpatient Medications on File Prior to Visit  Medication Sig Dispense Refill  . B-D UF III MINI PEN NEEDLES 31G X 5 MM MISC USE DAILY AS DIRECTED DX CODE E11.8 100 each 11  . Cholecalciferol (VITAMIN D3) 1.25 MG (50000 UT) CAPS TAKE 1 CAPSULE BY MOUTH ONE TIME PER WEEK 4 capsule 0  . glucose blood (ONETOUCH ULTRA) test strip 1 each by Other route 2 (two) times a day. Use to monitor glucose levels BID 180 each 3  . Icosapent Ethyl (VASCEPA) 1 g CAPS Take 2 capsules (2 g total) by mouth 2 (two) times  daily. 360 capsule 1  . Insulin Degludec (TRESIBA FLEXTOUCH) 200 UNIT/ML SOPN Inject 76 Units into the skin daily. And pen needles 1/day 12 pen 11  . Olmesartan-amLODIPine-HCTZ 40-5-12.5 MG TABS Take 1 tablet by mouth daily. 90 tablet 0  . OneTouch Delica Lancets 44I MISC 1 each by Does not apply route 2 (two) times a day. Use to monitor glucose levels BID 100 each 2  . Semaglutide, 1 MG/DOSE, (OZEMPIC, 1 MG/DOSE,) 2 MG/1.5ML SOPN Inject 1 mg into the skin once a week. 6 pen 3  . VIAGRA 100 MG tablet TAKE 1/2-1 BY MOUTH DAILY AS NEEDED FOR ERECTILE DYSFUNCTION 8 tablet 3  . zinc gluconate 50 MG tablet Take 1 tablet (50 mg total) by mouth daily. 90 tablet 1  . albuterol (PROVENTIL HFA;VENTOLIN HFA) 108 (90 BASE) MCG/ACT inhaler Inhale 2 puffs into the lungs every 6 (six) hours as needed for wheezing or shortness of breath.     No current facility-administered medications on file prior to visit.     Allergies  Allergen Reactions  . Metformin And Related Diarrhea  . Other Itching    Tree nuts     Family History  Problem Relation Age of Onset  . Diabetes Father   . Diabetes Mother   . Diabetes Brother        one brother  diabetic  . Cancer Neg Hx   . Heart disease Neg Hx   . Hyperlipidemia Neg Hx   . Hypertension Neg Hx     Ht 6\' 3"  (1.905 m)   BMI 32.25 kg/m   Review of Systems He denies hypoglycemia.  Denies sob and fever.       Objective:   Physical Exam      Assessment & Plan:  Insulin-requiring type 2 DM: glycemic control is apparently improved.   Coronavirus, new to me: we'll have to delay A1c testing until this is better  Patient Instructions  You can stay off the humalog, but please continue the same other diabetes medications.   check your blood sugar twice a day.  vary the time of day when you check, between before the 3 meals, and at bedtime.  also check if you have symptoms of your blood sugar being too high or too low.  please keep a record of the readings  and bring it to your next appointment here (or you can bring the meter itself).  You can write it on any piece of paper.  please call us sooner if your blood sugar goes below 70, or if you have a lot of readings over 200.   Please come back for a follow-up appointment in 2 months.

## 2019-04-18 ENCOUNTER — Telehealth: Payer: Self-pay | Admitting: Endocrinology

## 2019-04-18 ENCOUNTER — Other Ambulatory Visit: Payer: Self-pay

## 2019-04-18 ENCOUNTER — Other Ambulatory Visit: Payer: Self-pay | Admitting: Endocrinology

## 2019-04-18 DIAGNOSIS — E118 Type 2 diabetes mellitus with unspecified complications: Secondary | ICD-10-CM

## 2019-04-18 DIAGNOSIS — Z794 Long term (current) use of insulin: Secondary | ICD-10-CM

## 2019-04-18 MED ORDER — CANAGLIFLOZIN 300 MG PO TABS: 300.0000 mg | ORAL_TABLET | Freq: Every day | ORAL | 3 refills | Status: AC

## 2019-04-18 MED ORDER — TRESIBA FLEXTOUCH 200 UNIT/ML ~~LOC~~ SOPN: 76.0000 [IU] | PEN_INJECTOR | Freq: Every day | SUBCUTANEOUS | 0 refills | Status: DC

## 2019-04-18 NOTE — Telephone Encounter (Signed)
I have sent a prescription to your pharmacy, to refill 

## 2019-04-18 NOTE — Telephone Encounter (Signed)
please contact patient: We got refill request for Invokana. Are you taking this?

## 2019-04-18 NOTE — Telephone Encounter (Signed)
canagliflozin (INVOKANA) 300 MG TABS tablet 90 tablet 3 04/18/2019    Sig - Route: Take 1 tablet (300 mg total) by mouth daily. - Oral   Sent to pharmacy as: canagliflozin (INVOKANA) 300 MG Tab tablet   E-Prescribing Status: Receipt confirmed by pharmacy (04/18/2019 12:20 PM EST)

## 2019-04-18 NOTE — Telephone Encounter (Signed)
Called pt to inquire further. States he is taking Invokana in addition to [place]

## 2019-04-19 ENCOUNTER — Other Ambulatory Visit: Payer: Self-pay | Admitting: Internal Medicine

## 2019-04-19 DIAGNOSIS — I10 Essential (primary) hypertension: Secondary | ICD-10-CM

## 2019-04-19 DIAGNOSIS — E118 Type 2 diabetes mellitus with unspecified complications: Secondary | ICD-10-CM

## 2019-04-19 DIAGNOSIS — Z794 Long term (current) use of insulin: Secondary | ICD-10-CM

## 2019-04-19 MED ORDER — OLMESARTAN-AMLODIPINE-HCTZ 40-5-12.5 MG PO TABS: 1.0000 | ORAL_TABLET | Freq: Every day | ORAL | 1 refills | Status: AC

## 2019-05-05 ENCOUNTER — Encounter: Payer: BC Managed Care – PPO | Admitting: Neurology

## 2019-05-12 ENCOUNTER — Other Ambulatory Visit: Payer: Self-pay | Admitting: Internal Medicine

## 2019-05-12 DIAGNOSIS — Z9884 Bariatric surgery status: Secondary | ICD-10-CM

## 2019-05-12 DIAGNOSIS — E559 Vitamin D deficiency, unspecified: Secondary | ICD-10-CM

## 2019-05-28 DIAGNOSIS — L0291 Cutaneous abscess, unspecified: Secondary | ICD-10-CM | POA: Diagnosis not present

## 2019-06-10 DIAGNOSIS — Z20828 Contact with and (suspected) exposure to other viral communicable diseases: Secondary | ICD-10-CM | POA: Diagnosis not present

## 2019-06-30 DIAGNOSIS — Z20822 Contact with and (suspected) exposure to covid-19: Secondary | ICD-10-CM | POA: Diagnosis not present

## 2019-07-23 DIAGNOSIS — K006 Disturbances in tooth eruption: Secondary | ICD-10-CM | POA: Diagnosis not present

## 2019-08-07 ENCOUNTER — Ambulatory Visit: Payer: BC Managed Care – PPO | Attending: Internal Medicine

## 2019-08-07 ENCOUNTER — Ambulatory Visit: Payer: BC Managed Care – PPO

## 2019-08-07 DIAGNOSIS — Z23 Encounter for immunization: Secondary | ICD-10-CM

## 2019-08-07 NOTE — Progress Notes (Signed)
   Covid-19 Vaccination Clinic  Name:  Mark Oconnor    MRN: [id number] DOB: 03-31-1981  08/07/2019  Mr. Klinke was observed post Covid-19 immunization for 15 minutes without incident. He was provided with Vaccine Information Sheet and instruction to access the V-Safe system.   Mr. Fosdick was instructed to call 911 with any severe reactions post vaccine: Marland Kitchen Difficulty breathing  . Swelling of face and throat  . A fast heartbeat  . A bad rash all over body  . Dizziness and weakness   Immunizations Administered    Name Date Dose VIS Date Route   Pfizer COVID-19 Vaccine 08/07/2019  9:07 AM 0.3 mL 04/18/2019 Intramuscular   Manufacturer: [employer], [employer]   Lot: [id number]   NDC: [address]-0

## 2019-09-01 ENCOUNTER — Ambulatory Visit: Payer: BC Managed Care – PPO | Attending: Internal Medicine

## 2019-09-01 DIAGNOSIS — Z23 Encounter for immunization: Secondary | ICD-10-CM

## 2019-09-01 NOTE — Progress Notes (Signed)
   Covid-19 Vaccination Clinic  Name:  SIYON LINCK    MRN: [id number] DOB: 09-13-1980  09/01/2019  Mr. Lecomte was observed post Covid-19 immunization for 15 minutes without incident. He was provided with Vaccine Information Sheet and instruction to access the V-Safe system.   Mr. Pare was instructed to call 911 with any severe reactions post vaccine: Marland Kitchen Difficulty breathing  . Swelling of face and throat  . A fast heartbeat  . A bad rash all over body  . Dizziness and weakness   Immunizations Administered    Name Date Dose VIS Date Route   Pfizer COVID-19 Vaccine 09/01/2019  8:42 AM 0.3 mL 07/02/2018 Intramuscular   Manufacturer: [employer], [employer]   Lot: [id number]   NDC: [address]-1

## 2019-09-10 ENCOUNTER — Other Ambulatory Visit: Payer: Self-pay | Admitting: Endocrinology

## 2019-09-10 ENCOUNTER — Other Ambulatory Visit: Payer: Self-pay | Admitting: Internal Medicine

## 2019-09-10 DIAGNOSIS — N5201 Erectile dysfunction due to arterial insufficiency: Secondary | ICD-10-CM

## 2019-09-10 DIAGNOSIS — E118 Type 2 diabetes mellitus with unspecified complications: Secondary | ICD-10-CM

## 2019-09-10 DIAGNOSIS — Z794 Long term (current) use of insulin: Secondary | ICD-10-CM

## 2019-10-08 ENCOUNTER — Ambulatory Visit (INDEPENDENT_AMBULATORY_CARE_PROVIDER_SITE_OTHER): Payer: BC Managed Care – PPO | Admitting: Endocrinology

## 2019-10-08 ENCOUNTER — Other Ambulatory Visit: Payer: Self-pay

## 2019-10-08 ENCOUNTER — Encounter: Payer: Self-pay | Admitting: Endocrinology

## 2019-10-08 VITALS — BP 118/80 | HR 75 | Ht 75.0 in | Wt 262.0 lb

## 2019-10-08 DIAGNOSIS — E118 Type 2 diabetes mellitus with unspecified complications: Secondary | ICD-10-CM

## 2019-10-08 DIAGNOSIS — F1090 Alcohol use, unspecified, uncomplicated: Secondary | ICD-10-CM

## 2019-10-08 DIAGNOSIS — R200 Anesthesia of skin: Secondary | ICD-10-CM

## 2019-10-08 DIAGNOSIS — N5201 Erectile dysfunction due to arterial insufficiency: Secondary | ICD-10-CM

## 2019-10-08 DIAGNOSIS — Z7289 Other problems related to lifestyle: Secondary | ICD-10-CM

## 2019-10-08 DIAGNOSIS — Z794 Long term (current) use of insulin: Secondary | ICD-10-CM | POA: Diagnosis not present

## 2019-10-08 LAB — BASIC METABOLIC PANEL
BUN: 10 mg/dL (ref 6–23)
CO2: 28 mEq/L (ref 19–32)
Calcium: 9.3 mg/dL (ref 8.4–10.5)
Chloride: 99 mEq/L (ref 96–112)
Creatinine, Ser: 1.09 mg/dL (ref 0.40–1.50)
GFR: 91.01 mL/min (ref >60.00)
Glucose, Bld: 127 mg/dL — ABNORMAL HIGH (ref 70–99)
Potassium: 3.6 mEq/L (ref 3.5–5.1)
Sodium: 134 mEq/L — ABNORMAL LOW (ref 135–145)

## 2019-10-08 LAB — POCT GLYCOSYLATED HEMOGLOBIN (HGB A1C): Hemoglobin A1C: 13.2 % — AB (ref 4.0–5.6)

## 2019-10-08 LAB — VITAMIN B12: Vitamin B-12: >1526 pg/mL — ABNORMAL HIGH (ref 211–911)

## 2019-10-08 LAB — TSH: TSH: 0.97 u[IU]/mL (ref 0.35–4.50)

## 2019-10-08 NOTE — Progress Notes (Signed)
Subjective:    Patient ID: Mark Oconnor, male    DOB: 1980/05/31, 39 y.o.   MRN: [id number]  HPI Pt returns for f/u of diabetes mellitus: DM type: Insulin-requiring type 2 Dx'ed: 2011 Complications: PN Therapy: insulin since dx, and Ozempic.    DKA: never Severe hypoglycemia: never.  Pancreatitis: never.   Other: he had gastric bypass surgery in 2015--he has lost approx 90 lbs since then, but pt says this precipitated excessive alcohol intake; he declines multiple daily injections.    Interval history: Pt says he has not recently checked cbg.  He sometimes misses the insulin.  He feels alcohol intake impairs his DM care.  Pt says he takes meds as rx'ed now, except he does not take humalog.   Past Medical History:  Diagnosis Date  . Allergy   . Diabetes mellitus   . GERD (gastroesophageal reflux disease)   . Hyperlipidemia   . Hypertension   . Morbid obesity (HCC)   . Numbness and tingling of both feet   . Sleep apnea 05-21-13   denies.Stop/Bang score =4 with PAT visit    Past Surgical History:  Procedure Laterality Date  . BREATH TEK H PYLORI N/A 07/12/2012   Procedure: BREATH TEK H PYLORI;  Surgeon: Mariella Saa, MD;  Location: [facility];  Service: General;  Laterality: N/A;  . GASTRIC ROUX-EN-Y N/A 05/26/2013   Procedure: LAPAROSCOPIC ROUX-EN-Y GASTRIC BYPASS WITH UPPER ENDOSCOPY;  Surgeon: Mariella Saa, MD;  Location: [facility];  Service: General;  Laterality: N/A;    Social History   Socioeconomic History  . Marital status: Married    Spouse name: Not on file  . Number of children: 0  . Years of education: some college  . Highest education level: Not on file  Occupational History  . Not on file  Tobacco Use  . Smoking status: Former Smoker    Packs/day: 0.50    Years: 15.00    Pack years: 7.50    Types: Cigarettes    Quit date: 12/07/2011    Years since quitting: 7.8  . Smokeless tobacco: Never Used  Substance and Sexual Activity  . Alcohol  use: Yes    Alcohol/week: 10.0 standard drinks    Types: 10 Cans of beer per week    Comment: occasional to rare  . Drug use: No  . Sexual activity: Yes  Other Topics Concern  . Not on file  Social History Narrative   Lives at home with his wife.   Right-handed.   3 cups caffeine per day.   Social Determinants of Health   Financial Resource Strain:   . Difficulty of Paying Living Expenses:   Food Insecurity:   . Worried About Programme researcher, broadcasting/film/video in the Last Year:   . Barista in the Last Year:   Transportation Needs:   . Freight forwarder (Medical):   Marland Kitchen Lack of Transportation (Non-Medical):   Physical Activity:   . Days of Exercise per Week:   . Minutes of Exercise per Session:   Stress:   . Feeling of Stress :   Social Connections:   . Frequency of Communication with Friends and Family:   . Frequency of Social Gatherings with Friends and Family:   . Attends Religious Services:   . Active Member of Clubs or Organizations:   . Attends Banker Meetings:   Marland Kitchen Marital Status:   Intimate Partner Violence:   . Fear of Current or Ex-Partner:   .  Emotionally Abused:   Marland Kitchen Physically Abused:   . Sexually Abused:     Current Outpatient Medications on File Prior to Visit  Medication Sig Dispense Refill  . B-D UF III MINI PEN NEEDLES 31G X 5 MM MISC USE DAILY AS DIRECTED DX CODE E11.8 100 each 11  . canagliflozin (INVOKANA) 300 MG TABS tablet Take 1 tablet (300 mg total) by mouth daily. 90 tablet 3  . Cholecalciferol (VITAMIN D3) 1.25 MG (50000 UT) CAPS TAKE 1 CAPSULE BY MOUTH ONE TIME PER WEEK 4 capsule 0  . glucose blood (ONETOUCH ULTRA) test strip 1 each by Other route 2 (two) times a day. Use to monitor glucose levels BID 180 each 3  . Icosapent Ethyl (VASCEPA) 1 g CAPS Take 2 capsules (2 g total) by mouth 2 (two) times daily. 360 capsule 1  . Insulin Degludec (TRESIBA FLEXTOUCH) 200 UNIT/ML SOPN Inject 76 Units into the skin daily. 34 mL 0  .  Olmesartan-amLODIPine-HCTZ 40-5-12.5 MG TABS Take 1 tablet by mouth daily. 90 tablet 1  . OneTouch Delica Lancets 44Y MISC 1 each by Does not apply route 2 (two) times a day. Use to monitor glucose levels BID 100 each 2  . Semaglutide, 1 MG/DOSE, (OZEMPIC, 1 MG/DOSE,) 2 MG/1.5ML SOPN Inject 1 mg into the skin once a week. 6 pen 3  . VIAGRA 100 MG tablet TAKE 1/2-1 BY MOUTH DAILY AS NEEDED FOR ERECTILE DYSFUNCTION 24 tablet 1  . zinc gluconate 50 MG tablet Take 1 tablet (50 mg total) by mouth daily. 90 tablet 1  . albuterol (PROVENTIL HFA;VENTOLIN HFA) 108 (90 BASE) MCG/ACT inhaler Inhale 2 puffs into the lungs every 6 (six) hours as needed for wheezing or shortness of breath.     No current facility-administered medications on file prior to visit.    Allergies  Allergen Reactions  . Metformin And Related Diarrhea  . Other Itching    Tree nuts     Family History  Problem Relation Age of Onset  . Diabetes Father   . Diabetes Mother   . Diabetes Brother        one brother  diabetic  . Cancer Neg Hx   . Heart disease Neg Hx   . Hyperlipidemia Neg Hx   . Hypertension Neg Hx     BP 118/80   Pulse 75   Ht 6\' 3"  (1.905 m)   Wt 262 lb (118.8 kg)   SpO2 99%   BMI 32.75 kg/m    Review of Systems Numbness of the feet is worse.  He has ED sxs.     Objective:   Physical Exam VITAL SIGNS:  See vs page GENERAL: no distress Pulses: dorsalis pedis intact bilat.   MSK: no deformity of the feet CV: no leg edema Skin:  no ulcer on the feet.  normal color and temp on the feet. Neuro: sensation is intact to touch on the feet  Lab Results  Component Value Date   HGBA1C 13.2 (A) 10/08/2019   Lab Results  Component Value Date   TESTOSTERONE 423.46 04/26/2011   Lab Results  Component Value Date   TSH 1.22 05/30/2018   T4TOTAL 7.8 06/07/2012       Assessment & Plan:  ED: persistent Alcoholism: persistent Insulin-requiring type 2 DM: rx is limited by the above.     Polyneuropathy: worse.   Patient Instructions  Blood tests are requested for you today.  We'll let you know about the results.   If the  testosterone is normal, I'll refer you to a specialist.   Please see a psychology specialist.  you will receive a phone call, about a day and time for an appointment.   check your blood sugar twice a day.  vary the time of day when you check, between before the 3 meals, and at bedtime.  also check if you have symptoms of your blood sugar being too high or too low.  please keep a record of the readings and bring it to your next appointment here (or you can bring the meter itself).  You can write it on any piece of paper.  please call us sooner if your blood sugar goes below 70, or if you have a lot of readings over 200.   Please continue the same medications.   Some specialists say that taking a high amount of folic acid (4-5 mg per day) helps the neuropathy.   Please come back for a follow-up appointment in 3 months.

## 2019-10-08 NOTE — Patient Instructions (Addendum)
Blood tests are requested for you today.  We'll let you know about the results.   If the testosterone is normal, I'll refer you to a specialist.   Please see a psychology specialist.  you will receive a phone call, about a day and time for an appointment.   check your blood sugar twice a day.  vary the time of day when you check, between before the 3 meals, and at bedtime.  also check if you have symptoms of your blood sugar being too high or too low.  please keep a record of the readings and bring it to your next appointment here (or you can bring the meter itself).  You can write it on any piece of paper.  please call us sooner if your blood sugar goes below 70, or if you have a lot of readings over 200.   Please continue the same medications.   Some specialists say that taking a high amount of folic acid (4-5 mg per day) helps the neuropathy.   Please come back for a follow-up appointment in 3 months.

## 2019-10-09 ENCOUNTER — Other Ambulatory Visit: Payer: Self-pay | Admitting: Internal Medicine

## 2019-10-09 DIAGNOSIS — E118 Type 2 diabetes mellitus with unspecified complications: Secondary | ICD-10-CM

## 2019-10-10 LAB — TESTOSTERONE,FREE AND TOTAL
Testosterone, Free: 18.6 pg/mL (ref 8.7–25.1)
Testosterone: 534 ng/dL (ref 264–916)

## 2019-10-13 DIAGNOSIS — M19071 Primary osteoarthritis, right ankle and foot: Secondary | ICD-10-CM | POA: Diagnosis not present

## 2019-10-13 DIAGNOSIS — G5790 Unspecified mononeuropathy of unspecified lower limb: Secondary | ICD-10-CM | POA: Diagnosis not present

## 2019-10-13 DIAGNOSIS — M19072 Primary osteoarthritis, left ankle and foot: Secondary | ICD-10-CM | POA: Diagnosis not present

## 2019-10-15 ENCOUNTER — Telehealth: Payer: Self-pay | Admitting: *Deleted

## 2019-10-15 ENCOUNTER — Encounter: Payer: Self-pay | Admitting: Neurology

## 2019-10-15 ENCOUNTER — Encounter: Payer: BC Managed Care – PPO | Admitting: Neurology

## 2019-10-15 NOTE — Telephone Encounter (Signed)
No showed NCV/EMG appointment. 

## 2019-10-16 ENCOUNTER — Other Ambulatory Visit: Payer: Self-pay

## 2019-10-16 DIAGNOSIS — E118 Type 2 diabetes mellitus with unspecified complications: Secondary | ICD-10-CM

## 2019-10-16 MED ORDER — TRESIBA FLEXTOUCH 200 UNIT/ML ~~LOC~~ SOPN: 76.0000 [IU] | PEN_INJECTOR | Freq: Every day | SUBCUTANEOUS | 0 refills | Status: DC

## 2019-10-21 ENCOUNTER — Other Ambulatory Visit: Payer: Self-pay

## 2019-10-21 DIAGNOSIS — E118 Type 2 diabetes mellitus with unspecified complications: Secondary | ICD-10-CM

## 2019-10-21 MED ORDER — TRESIBA FLEXTOUCH 200 UNIT/ML ~~LOC~~ SOPN: 76.0000 [IU] | PEN_INJECTOR | Freq: Every day | SUBCUTANEOUS | 0 refills | Status: AC

## 2019-11-06 DIAGNOSIS — G609 Hereditary and idiopathic neuropathy, unspecified: Secondary | ICD-10-CM | POA: Diagnosis not present

## 2019-11-06 DIAGNOSIS — E559 Vitamin D deficiency, unspecified: Secondary | ICD-10-CM | POA: Diagnosis not present

## 2019-11-06 DIAGNOSIS — R202 Paresthesia of skin: Secondary | ICD-10-CM | POA: Diagnosis not present

## 2019-11-06 DIAGNOSIS — E538 Deficiency of other specified B group vitamins: Secondary | ICD-10-CM | POA: Diagnosis not present

## 2019-11-06 DIAGNOSIS — G603 Idiopathic progressive neuropathy: Secondary | ICD-10-CM | POA: Diagnosis not present

## 2019-11-06 DIAGNOSIS — G5601 Carpal tunnel syndrome, right upper limb: Secondary | ICD-10-CM | POA: Diagnosis not present

## 2019-11-06 DIAGNOSIS — R634 Abnormal weight loss: Secondary | ICD-10-CM | POA: Diagnosis not present

## 2019-12-06 DIAGNOSIS — H15102 Unspecified episcleritis, left eye: Secondary | ICD-10-CM | POA: Diagnosis not present

## 2019-12-18 DIAGNOSIS — M5417 Radiculopathy, lumbosacral region: Secondary | ICD-10-CM | POA: Diagnosis not present

## 2019-12-18 DIAGNOSIS — G603 Idiopathic progressive neuropathy: Secondary | ICD-10-CM | POA: Diagnosis not present

## 2019-12-18 DIAGNOSIS — R202 Paresthesia of skin: Secondary | ICD-10-CM | POA: Diagnosis not present

## 2019-12-18 DIAGNOSIS — G5601 Carpal tunnel syndrome, right upper limb: Secondary | ICD-10-CM | POA: Diagnosis not present

## 2019-12-24 DIAGNOSIS — Z794 Long term (current) use of insulin: Secondary | ICD-10-CM | POA: Diagnosis not present

## 2019-12-24 DIAGNOSIS — I10 Essential (primary) hypertension: Secondary | ICD-10-CM | POA: Diagnosis not present

## 2019-12-24 DIAGNOSIS — E559 Vitamin D deficiency, unspecified: Secondary | ICD-10-CM | POA: Diagnosis not present

## 2019-12-24 DIAGNOSIS — E118 Type 2 diabetes mellitus with unspecified complications: Secondary | ICD-10-CM | POA: Diagnosis not present

## 2019-12-24 DIAGNOSIS — E538 Deficiency of other specified B group vitamins: Secondary | ICD-10-CM | POA: Diagnosis not present

## 2019-12-24 DIAGNOSIS — E785 Hyperlipidemia, unspecified: Secondary | ICD-10-CM | POA: Diagnosis not present

## 2019-12-25 DIAGNOSIS — Z794 Long term (current) use of insulin: Secondary | ICD-10-CM | POA: Diagnosis not present

## 2019-12-25 DIAGNOSIS — E118 Type 2 diabetes mellitus with unspecified complications: Secondary | ICD-10-CM | POA: Diagnosis not present

## 2019-12-29 DIAGNOSIS — E291 Testicular hypofunction: Secondary | ICD-10-CM | POA: Diagnosis not present

## 2019-12-29 DIAGNOSIS — N529 Male erectile dysfunction, unspecified: Secondary | ICD-10-CM | POA: Diagnosis not present

## 2020-01-01 DIAGNOSIS — Z794 Long term (current) use of insulin: Secondary | ICD-10-CM | POA: Diagnosis not present

## 2020-01-01 DIAGNOSIS — E781 Pure hyperglyceridemia: Secondary | ICD-10-CM | POA: Diagnosis not present

## 2020-01-01 DIAGNOSIS — E118 Type 2 diabetes mellitus with unspecified complications: Secondary | ICD-10-CM | POA: Diagnosis not present

## 2020-01-01 DIAGNOSIS — I10 Essential (primary) hypertension: Secondary | ICD-10-CM | POA: Diagnosis not present

## 2020-01-01 DIAGNOSIS — E119 Type 2 diabetes mellitus without complications: Secondary | ICD-10-CM | POA: Diagnosis not present

## 2020-01-08 ENCOUNTER — Telehealth: Payer: Self-pay | Admitting: Endocrinology

## 2020-01-08 NOTE — Telephone Encounter (Signed)
PT has been marked as self dismissed

## 2020-01-08 NOTE — Telephone Encounter (Signed)
Please refer to pt response below 

## 2020-01-08 NOTE — Telephone Encounter (Signed)
I called patient to confirm & inform 01/13/20 appointment. Patient stated to cancel the appointment because he has a new Endocrinologist and will no longer be getting treatment from Dr. Everardo All.

## 2020-01-09 DIAGNOSIS — G603 Idiopathic progressive neuropathy: Secondary | ICD-10-CM | POA: Insufficient documentation

## 2020-01-09 DIAGNOSIS — G5601 Carpal tunnel syndrome, right upper limb: Secondary | ICD-10-CM | POA: Insufficient documentation

## 2020-01-13 ENCOUNTER — Ambulatory Visit: Payer: BC Managed Care – PPO | Admitting: Endocrinology

## 2020-01-15 DIAGNOSIS — G5601 Carpal tunnel syndrome, right upper limb: Secondary | ICD-10-CM | POA: Diagnosis not present

## 2020-01-15 DIAGNOSIS — M5417 Radiculopathy, lumbosacral region: Secondary | ICD-10-CM | POA: Diagnosis not present

## 2020-01-15 DIAGNOSIS — R202 Paresthesia of skin: Secondary | ICD-10-CM | POA: Diagnosis not present

## 2020-01-28 DIAGNOSIS — E785 Hyperlipidemia, unspecified: Secondary | ICD-10-CM | POA: Diagnosis not present

## 2020-01-28 DIAGNOSIS — I10 Essential (primary) hypertension: Secondary | ICD-10-CM | POA: Diagnosis not present

## 2020-01-28 DIAGNOSIS — E1165 Type 2 diabetes mellitus with hyperglycemia: Secondary | ICD-10-CM | POA: Diagnosis not present

## 2020-01-28 DIAGNOSIS — E781 Pure hyperglyceridemia: Secondary | ICD-10-CM | POA: Diagnosis not present

## 2020-01-29 DIAGNOSIS — N529 Male erectile dysfunction, unspecified: Secondary | ICD-10-CM | POA: Diagnosis not present

## 2020-01-29 DIAGNOSIS — G4733 Obstructive sleep apnea (adult) (pediatric): Secondary | ICD-10-CM | POA: Diagnosis not present

## 2020-01-29 DIAGNOSIS — E781 Pure hyperglyceridemia: Secondary | ICD-10-CM | POA: Diagnosis not present

## 2020-01-29 DIAGNOSIS — E291 Testicular hypofunction: Secondary | ICD-10-CM | POA: Diagnosis not present

## 2020-01-29 DIAGNOSIS — E782 Mixed hyperlipidemia: Secondary | ICD-10-CM | POA: Diagnosis not present

## 2020-01-29 DIAGNOSIS — I10 Essential (primary) hypertension: Secondary | ICD-10-CM | POA: Diagnosis not present

## 2020-01-31 DIAGNOSIS — R9431 Abnormal electrocardiogram [ECG] [EKG]: Secondary | ICD-10-CM | POA: Diagnosis not present

## 2020-02-04 DIAGNOSIS — M7742 Metatarsalgia, left foot: Secondary | ICD-10-CM | POA: Diagnosis not present

## 2020-02-04 DIAGNOSIS — M7741 Metatarsalgia, right foot: Secondary | ICD-10-CM | POA: Insufficient documentation

## 2020-02-04 DIAGNOSIS — R200 Anesthesia of skin: Secondary | ICD-10-CM | POA: Diagnosis not present

## 2020-02-04 DIAGNOSIS — R202 Paresthesia of skin: Secondary | ICD-10-CM | POA: Diagnosis not present

## 2020-02-12 ENCOUNTER — Other Ambulatory Visit: Payer: Self-pay | Admitting: Internal Medicine

## 2020-02-12 DIAGNOSIS — I10 Essential (primary) hypertension: Secondary | ICD-10-CM

## 2020-02-12 DIAGNOSIS — Z794 Long term (current) use of insulin: Secondary | ICD-10-CM

## 2020-02-12 DIAGNOSIS — E118 Type 2 diabetes mellitus with unspecified complications: Secondary | ICD-10-CM

## 2020-02-13 DIAGNOSIS — E8809 Other disorders of plasma-protein metabolism, not elsewhere classified: Secondary | ICD-10-CM | POA: Diagnosis not present

## 2020-02-13 DIAGNOSIS — E1129 Type 2 diabetes mellitus with other diabetic kidney complication: Secondary | ICD-10-CM | POA: Diagnosis not present

## 2020-02-13 DIAGNOSIS — I10 Essential (primary) hypertension: Secondary | ICD-10-CM | POA: Diagnosis not present

## 2020-02-13 DIAGNOSIS — Z794 Long term (current) use of insulin: Secondary | ICD-10-CM | POA: Diagnosis not present

## 2020-03-09 DIAGNOSIS — R52 Pain, unspecified: Secondary | ICD-10-CM | POA: Diagnosis not present

## 2020-03-09 DIAGNOSIS — Z20822 Contact with and (suspected) exposure to covid-19: Secondary | ICD-10-CM | POA: Diagnosis not present

## 2020-03-09 DIAGNOSIS — R059 Cough, unspecified: Secondary | ICD-10-CM | POA: Diagnosis not present

## 2020-03-09 DIAGNOSIS — J40 Bronchitis, not specified as acute or chronic: Secondary | ICD-10-CM | POA: Diagnosis not present

## 2020-03-18 DIAGNOSIS — I10 Essential (primary) hypertension: Secondary | ICD-10-CM | POA: Diagnosis not present

## 2020-03-18 DIAGNOSIS — R079 Chest pain, unspecified: Secondary | ICD-10-CM | POA: Insufficient documentation

## 2020-03-18 DIAGNOSIS — E781 Pure hyperglyceridemia: Secondary | ICD-10-CM | POA: Diagnosis not present

## 2020-03-23 DIAGNOSIS — R200 Anesthesia of skin: Secondary | ICD-10-CM | POA: Diagnosis not present

## 2020-03-23 DIAGNOSIS — R202 Paresthesia of skin: Secondary | ICD-10-CM | POA: Diagnosis not present

## 2020-04-09 DIAGNOSIS — Z113 Encounter for screening for infections with a predominantly sexual mode of transmission: Secondary | ICD-10-CM | POA: Diagnosis not present

## 2020-04-12 DIAGNOSIS — Z794 Long term (current) use of insulin: Secondary | ICD-10-CM | POA: Diagnosis not present

## 2020-04-12 DIAGNOSIS — R059 Cough, unspecified: Secondary | ICD-10-CM | POA: Diagnosis not present

## 2020-04-12 DIAGNOSIS — H52203 Unspecified astigmatism, bilateral: Secondary | ICD-10-CM | POA: Diagnosis not present

## 2020-04-12 DIAGNOSIS — H524 Presbyopia: Secondary | ICD-10-CM | POA: Diagnosis not present

## 2020-04-12 DIAGNOSIS — I10 Essential (primary) hypertension: Secondary | ICD-10-CM | POA: Diagnosis not present

## 2020-04-12 DIAGNOSIS — E113292 Type 2 diabetes mellitus with mild nonproliferative diabetic retinopathy without macular edema, left eye: Secondary | ICD-10-CM | POA: Diagnosis not present

## 2020-04-12 DIAGNOSIS — R481 Agnosia: Secondary | ICD-10-CM | POA: Diagnosis not present

## 2020-04-12 DIAGNOSIS — H5213 Myopia, bilateral: Secondary | ICD-10-CM | POA: Diagnosis not present

## 2020-05-07 ENCOUNTER — Other Ambulatory Visit: Payer: Self-pay

## 2020-05-07 ENCOUNTER — Encounter (HOSPITAL_COMMUNITY): Payer: Self-pay | Admitting: Emergency Medicine

## 2020-05-07 ENCOUNTER — Emergency Department (HOSPITAL_COMMUNITY)
Admission: EM | Admit: 2020-05-07 | Discharge: 2020-05-08 | Disposition: A | Discharge status: Home / Self Care | Payer: BC Managed Care – PPO | Attending: Emergency Medicine | Admitting: Emergency Medicine

## 2020-05-07 DIAGNOSIS — Z5321 Procedure and treatment not carried out due to patient leaving prior to being seen by health care provider: Secondary | ICD-10-CM | POA: Diagnosis not present

## 2020-05-07 DIAGNOSIS — I10 Essential (primary) hypertension: Secondary | ICD-10-CM | POA: Diagnosis not present

## 2020-05-07 DIAGNOSIS — F329 Major depressive disorder, single episode, unspecified: Secondary | ICD-10-CM | POA: Diagnosis not present

## 2020-05-07 LAB — CBC
HCT: 44.8 % (ref 39.0–52.0)
Hemoglobin: 14.9 g/dL (ref 13.0–17.0)
MCH: 26.8 pg (ref 26.0–34.0)
MCHC: 33.3 g/dL (ref 30.0–36.0)
MCV: 80.7 fL (ref 80.0–100.0)
Platelets: 244 10*3/uL (ref 150–400)
RBC: 5.55 MIL/uL (ref 4.22–5.81)
RDW: 13.2 % (ref 11.5–15.5)
WBC: 6.2 10*3/uL (ref 4.0–10.5)
nRBC: 0 % (ref 0.0–0.2)

## 2020-05-07 LAB — BASIC METABOLIC PANEL
Anion gap: 12 (ref 5–15)
BUN: 12 mg/dL (ref 6–20)
CO2: 22 mmol/L (ref 22–32)
Calcium: 9.3 mg/dL (ref 8.9–10.3)
Chloride: 101 mmol/L (ref 98–111)
Creatinine, Ser: 1.13 mg/dL (ref 0.61–1.24)
GFR, Estimated: >60 mL/min (ref >60)
Glucose, Bld: 187 mg/dL — ABNORMAL HIGH (ref 70–99)
Potassium: 3.5 mmol/L (ref 3.5–5.1)
Sodium: 135 mmol/L (ref 135–145)

## 2020-05-07 NOTE — ED Triage Notes (Signed)
Pt reports noting having elevated blood pressure readings today while at home. Hx of HTN, compliant with medications per patient. Denies headache, new vision changes. Alert, oriented x4, NAD.

## 2020-05-07 NOTE — ED Notes (Signed)
Pt stating that he is no longer able to wait. Pt encouraged to stay, but told to return to the ED if anything changes.

## 2020-06-30 DIAGNOSIS — M25572 Pain in left ankle and joints of left foot: Secondary | ICD-10-CM | POA: Insufficient documentation

## 2020-10-14 DIAGNOSIS — R200 Anesthesia of skin: Secondary | ICD-10-CM | POA: Insufficient documentation

## 2020-10-25 DIAGNOSIS — L84 Corns and callosities: Secondary | ICD-10-CM | POA: Insufficient documentation

## 2020-11-03 DIAGNOSIS — M21962 Unspecified acquired deformity of left lower leg: Secondary | ICD-10-CM | POA: Insufficient documentation

## 2020-11-03 DIAGNOSIS — M21961 Unspecified acquired deformity of right lower leg: Secondary | ICD-10-CM | POA: Insufficient documentation

## 2020-11-03 DIAGNOSIS — S90424S Blister (nonthermal), right lesser toe(s), sequela: Secondary | ICD-10-CM | POA: Insufficient documentation

## 2020-12-01 ENCOUNTER — Other Ambulatory Visit: Payer: Self-pay

## 2020-12-01 ENCOUNTER — Ambulatory Visit (INDEPENDENT_AMBULATORY_CARE_PROVIDER_SITE_OTHER): Payer: BC Managed Care – PPO | Admitting: Podiatry

## 2020-12-01 DIAGNOSIS — B351 Tinea unguium: Secondary | ICD-10-CM

## 2020-12-01 DIAGNOSIS — M79674 Pain in right toe(s): Secondary | ICD-10-CM

## 2020-12-01 DIAGNOSIS — E0843 Diabetes mellitus due to underlying condition with diabetic autonomic (poly)neuropathy: Secondary | ICD-10-CM | POA: Diagnosis not present

## 2020-12-01 DIAGNOSIS — M79675 Pain in left toe(s): Secondary | ICD-10-CM | POA: Diagnosis not present

## 2020-12-01 DIAGNOSIS — E119 Type 2 diabetes mellitus without complications: Secondary | ICD-10-CM

## 2020-12-01 NOTE — Progress Notes (Signed)
   SUBJECTIVE Patient with a history of diabetes mellitus presents to office today complaining of elongated, thickened nails that cause pain while ambulating in shoes.  Patient is unable to trim their own nails. Patient is here for further evaluation and treatment.   Past Medical History:  Diagnosis Date   Allergy    Diabetes mellitus    GERD (gastroesophageal reflux disease)    Hyperlipidemia    Hypertension    Morbid obesity (HCC)    Numbness and tingling of both feet    Sleep apnea 05-21-13   denies.Stop/Bang score =4 with PAT visit    OBJECTIVE General Patient is awake, alert, and oriented x 3 and in no acute distress. Derm Skin is dry and supple bilateral. Negative open lesions or macerations. Remaining integument unremarkable. Nails are tender, long, thickened and dystrophic with subungual debris, consistent with onychomycosis, 1-5 bilateral. No signs of infection noted. Vasc  DP and PT pedal pulses palpable bilaterally. Temperature gradient within normal limits.  Neuro Epicritic and protective threshold sensation diminished bilaterally.  Musculoskeletal Exam No symptomatic pedal deformities noted bilateral. Muscular strength within normal limits.  ASSESSMENT 1. Diabetes Mellitus w/ peripheral neuropathy 2.  Pain due to onychomycosis of toenails bilateral  PLAN OF CARE 1. Patient evaluated today.  Comprehensive diabetic foot evaluation performed today 2. Instructed to maintain good pedal hygiene and foot care. Stressed importance of controlling blood sugar.  3. Mechanical debridement of nails 1-5 bilaterally performed using a nail nipper. Filed with dremel without incident.  4. Return to clinic in 3 mos.     Felecia Shelling, DPM [facility]  Dr. Felecia Shelling, DPM    [address] [place], [place] [address]                Office [phone]  Fax [phone]

## 2021-01-13 ENCOUNTER — Other Ambulatory Visit: Payer: Self-pay | Admitting: Endocrinology

## 2021-01-13 DIAGNOSIS — E118 Type 2 diabetes mellitus with unspecified complications: Secondary | ICD-10-CM

## 2021-04-15 ENCOUNTER — Ambulatory Visit: Payer: BC Managed Care – PPO | Admitting: Dietician

## 2021-04-18 ENCOUNTER — Other Ambulatory Visit (HOSPITAL_COMMUNITY): Payer: Self-pay

## 2021-04-18 DIAGNOSIS — Q825 Congenital non-neoplastic nevus: Secondary | ICD-10-CM | POA: Insufficient documentation

## 2021-04-18 MED ORDER — OZEMPIC (2 MG/DOSE) 8 MG/3ML ~~LOC~~ SOPN
2.0000 mg | PEN_INJECTOR | SUBCUTANEOUS | 3 refills | Status: AC
  Filled 2021-04-18 (×2): qty 3, 28d supply, fill #0

## 2021-04-19 ENCOUNTER — Other Ambulatory Visit (HOSPITAL_COMMUNITY): Payer: Self-pay

## 2021-04-20 ENCOUNTER — Other Ambulatory Visit (HOSPITAL_COMMUNITY): Payer: Self-pay

## 2021-04-20 MED ORDER — OZEMPIC (2 MG/DOSE) 8 MG/3ML ~~LOC~~ SOPN
2.0000 mg | PEN_INJECTOR | SUBCUTANEOUS | 3 refills | Status: DC
  Filled 2021-04-20: qty 3, 28d supply, fill #0

## 2021-06-03 DIAGNOSIS — G629 Polyneuropathy, unspecified: Secondary | ICD-10-CM | POA: Insufficient documentation

## 2021-06-03 DIAGNOSIS — F1090 Alcohol use, unspecified, uncomplicated: Secondary | ICD-10-CM | POA: Insufficient documentation

## 2021-06-10 ENCOUNTER — Ambulatory Visit: Payer: BC Managed Care – PPO | Admitting: Dietician

## 2021-08-01 ENCOUNTER — Encounter: Payer: BC Managed Care – PPO | Attending: Dermatology | Admitting: Dietician

## 2021-08-01 ENCOUNTER — Other Ambulatory Visit: Payer: Self-pay

## 2021-08-01 ENCOUNTER — Encounter: Payer: Self-pay | Admitting: Dietician

## 2021-08-01 DIAGNOSIS — E118 Type 2 diabetes mellitus with unspecified complications: Secondary | ICD-10-CM | POA: Insufficient documentation

## 2021-08-01 DIAGNOSIS — Z794 Long term (current) use of insulin: Secondary | ICD-10-CM | POA: Diagnosis not present

## 2021-08-01 NOTE — Patient Instructions (Addendum)
Consider upgrade to the Wheeler 3 ?You don't need to keep the insulin pen that you are using in the refrigerator.  Keep it somewhere convenient. ? ?Rethink what you drink. ?Get a new water bottle. ?Consider 600 mg ALA twice per day (for neuropathy symptoms) ? ?Calorie king app. - look at choices when eating out.  Choose options with less fat.  Aim for about 50 grams of carbs per meal. ? ?Read labels ?More nutrition quality. ?

## 2021-08-01 NOTE — Progress Notes (Signed)
?Medical Nutrition Therapy  ?Appointment Start time:  1400  Appointment End time:  1530 ?Patient is here today alone.  He was last seen by an RD here in 2016. ? ?Primary concerns today:  ?He would like to learn more about nutrition and neuropathy, good breakfast and lunch ideas as well as guidelines on timing of dinner and when to stop eating. ?He states his weight went down in the past as his blood glucose was out of control. ?Neuropathy symptoms started when he started to exercise 10/2020.  He can only sleep with socks on, foot cream works. ?He thinks that he cannot lose weight due to his alcohol use.  He quit going to AA as he was not getting results.  He starts drinking first thing in the am on his days off or starts after the gym on week days.  He drinks 5-6 beer  or wine per day and occasional mixed drink. ?Referral diagnosis: Type 2 diabetes ?Preferred learning style: no preference indicated)Learning readiness: contemplating/change in progress ? ? ?NUTRITION ASSESSMENT  ? ?Anthropometrics  ?76" ?271 lbs 08/01/2021 ?261 lbs 03/2022 ?400 lbs highest adult weight ?235 lbs lowest adult weight (41 yo) ?Lost weight when he was off alcohol for a week.  ? ?Clinical ?Medical Hx: Type 2 diabetes (2009), Bariatric surgery Roux-en-Y, GERD, OSA, HLD, HTN, ED ?Allergic to tree nuts ?Medications: Trisiba 45 units q am, Ozempic 2 mg, Jardiance, vitamin D ?Labs: A1C 8.4% 06/03/2021, cholesterol 161, HDL 57, LDL 73, Triglycerides 153, eGFR 88, 06/23/2021 ?CGM:  FreeStyle Libre 2 (55% in range, average glucose 176 ?Notable Signs/Symptoms: Glucose over 200 most evenings ? ?Lifestyle & Dietary Hx ?Patient lives with his wife.  She does the shopping and they eat very simply.  She works nights and does not cook.  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   He is gaining weight and states this is because of alcohol use. ? ?Estimated daily fluid intake: 36 oz water per day plus other beverages.  Used to  drink 1 gallon of water daily until changes occurred at work ?Sleep: 6 pm - 11 pm then 2 pm - 4 am. ?Stress / self-care: moderate ?Current average weekly physical activity: He is exercising 4-5 times per week (gym walking 3.5 miles per day). ? ?24-Hr Dietary Recall ?First Meal: [employer] or [place] OR cup of soup, chips, diet soda ?Snack: chips ?Second Meal: TV dinner (cafe steamers) ?Snack: none ?Third Meal: TV dinner or take out ?Snack: none ?Beverages: 5-7 servings of alcohol per night (beer and wine), water (used to drink 1 gallong per day and now 1-2 glasses per day), diet Mt. Dew (1-2 per day), occasional unsweetened tea ? ? ?NUTRITION DIAGNOSIS  ?NB-1.1 Food and nutrition-related knowledge deficit As related to behavior change related to nutrition and lifestyle choices.  As evidenced by diet hx and patient report. ? ? ?NUTRITION INTERVENTION  ?Nutrition education (E-1) on the following topics:  ?Counseling and diabetes education initiated.  ?Discussed Carb Counting by food group as method of portion control, reading food labels, and benefits of increased activity.  ?Discussed basic physiology of Diabetes, target BG ranges pre and post meals, and A1c.  ?Discussed his alcohol use and tips for behavior change. Provided list of counselors ?Discussed diabetes and complications to include neuropathy, cardiovascular disease and ED.  Discussed that neuropathy can be from several causes but diabetes can be one cause.  Discussed that there is no food list for neuropathy but overall recommend   a more nutrient dense diet and blood glucose control. ?Discussed his medication use.  Does not take his insulin at times as he does not want to go to the refrigerator to get it.  Discussed that the insulin pen that he is using should not be refrigerated and should be placed in a location that makes consistent use easy. ?Discussed label reading and eating out ? ?Handouts Provided Include  ?How to Thrive:  A Guide for Your  Journey with Diabetes by the ADA ?Label reading ?Diabetes Resources ?List of counselors ? ?Learning Style & Readiness for Change ?Teaching method utilized: Visual & Auditory  ?Demonstrated degree of understanding via: Teach Back  ?Barriers to learning/adherence to lifestyle change: lack of cooking skills ? ?Goals Established by Pt ? ?Consider upgrade to the Libre 3 ?You don't need to keep the insulin pen that you are using in the refrigerator.  Keep it somewhere convenient. ? ?Rethink what you drink. ?Get a new water bottle. ?Consider 600 mg ALA twice per day (for neuropathy symptoms) ? ?Calorie king app. - look at choices when eating out.  Choose options with less fat.  Aim for about 50 grams of carbs per meal. ? ?Read labels ?More nutrition quality. ? ?MONITORING & EVALUATION ?Dietary intake, weekly physical activity, label reading and healthy choices when eating out. ? ?Next Steps  ?Patient is to follow up in 2 months and call for any questions. ?

## 2021-08-12 DIAGNOSIS — N529 Male erectile dysfunction, unspecified: Secondary | ICD-10-CM | POA: Diagnosis not present

## 2021-08-22 DIAGNOSIS — E669 Obesity, unspecified: Secondary | ICD-10-CM | POA: Diagnosis not present

## 2021-08-22 DIAGNOSIS — N529 Male erectile dysfunction, unspecified: Secondary | ICD-10-CM | POA: Diagnosis not present

## 2021-08-22 DIAGNOSIS — E118 Type 2 diabetes mellitus with unspecified complications: Secondary | ICD-10-CM | POA: Diagnosis not present

## 2021-08-22 DIAGNOSIS — Z6837 Body mass index (BMI) 37.0-37.9, adult: Secondary | ICD-10-CM | POA: Diagnosis not present

## 2021-08-31 DIAGNOSIS — R809 Proteinuria, unspecified: Secondary | ICD-10-CM | POA: Diagnosis not present

## 2021-08-31 DIAGNOSIS — Z7984 Long term (current) use of oral hypoglycemic drugs: Secondary | ICD-10-CM | POA: Diagnosis not present

## 2021-08-31 DIAGNOSIS — E1142 Type 2 diabetes mellitus with diabetic polyneuropathy: Secondary | ICD-10-CM | POA: Diagnosis not present

## 2021-08-31 DIAGNOSIS — Z7985 Long-term (current) use of injectable non-insulin antidiabetic drugs: Secondary | ICD-10-CM | POA: Diagnosis not present

## 2021-08-31 DIAGNOSIS — E1165 Type 2 diabetes mellitus with hyperglycemia: Secondary | ICD-10-CM | POA: Diagnosis not present

## 2021-08-31 DIAGNOSIS — E1129 Type 2 diabetes mellitus with other diabetic kidney complication: Secondary | ICD-10-CM | POA: Diagnosis not present

## 2021-08-31 DIAGNOSIS — Z794 Long term (current) use of insulin: Secondary | ICD-10-CM | POA: Diagnosis not present

## 2021-09-08 ENCOUNTER — Ambulatory Visit: Payer: BC Managed Care – PPO | Admitting: Dietician

## 2021-10-09 DIAGNOSIS — Z3202 Encounter for pregnancy test, result negative: Secondary | ICD-10-CM | POA: Diagnosis not present

## 2021-10-09 DIAGNOSIS — N760 Acute vaginitis: Secondary | ICD-10-CM | POA: Diagnosis not present

## 2021-10-10 DIAGNOSIS — I10 Essential (primary) hypertension: Secondary | ICD-10-CM | POA: Diagnosis not present

## 2021-10-10 DIAGNOSIS — F1090 Alcohol use, unspecified, uncomplicated: Secondary | ICD-10-CM | POA: Diagnosis not present

## 2021-10-10 DIAGNOSIS — F439 Reaction to severe stress, unspecified: Secondary | ICD-10-CM | POA: Diagnosis not present

## 2021-10-10 DIAGNOSIS — E782 Mixed hyperlipidemia: Secondary | ICD-10-CM | POA: Diagnosis not present

## 2021-11-17 DIAGNOSIS — H524 Presbyopia: Secondary | ICD-10-CM | POA: Diagnosis not present

## 2021-11-17 DIAGNOSIS — Z794 Long term (current) use of insulin: Secondary | ICD-10-CM | POA: Diagnosis not present

## 2021-11-17 DIAGNOSIS — E113392 Type 2 diabetes mellitus with moderate nonproliferative diabetic retinopathy without macular edema, left eye: Secondary | ICD-10-CM | POA: Diagnosis not present

## 2021-11-17 DIAGNOSIS — H52203 Unspecified astigmatism, bilateral: Secondary | ICD-10-CM | POA: Diagnosis not present

## 2021-11-17 DIAGNOSIS — H5213 Myopia, bilateral: Secondary | ICD-10-CM | POA: Diagnosis not present

## 2021-11-17 DIAGNOSIS — Z7984 Long term (current) use of oral hypoglycemic drugs: Secondary | ICD-10-CM | POA: Diagnosis not present

## 2021-11-17 DIAGNOSIS — Z7985 Long-term (current) use of injectable non-insulin antidiabetic drugs: Secondary | ICD-10-CM | POA: Diagnosis not present

## 2021-12-12 DIAGNOSIS — E119 Type 2 diabetes mellitus without complications: Secondary | ICD-10-CM | POA: Diagnosis not present

## 2021-12-23 DIAGNOSIS — R051 Acute cough: Secondary | ICD-10-CM | POA: Diagnosis not present

## 2021-12-23 DIAGNOSIS — J029 Acute pharyngitis, unspecified: Secondary | ICD-10-CM | POA: Diagnosis not present

## 2021-12-23 DIAGNOSIS — Z20822 Contact with and (suspected) exposure to covid-19: Secondary | ICD-10-CM | POA: Diagnosis not present

## 2022-01-02 DIAGNOSIS — R809 Proteinuria, unspecified: Secondary | ICD-10-CM | POA: Diagnosis not present

## 2022-01-02 DIAGNOSIS — Z7984 Long term (current) use of oral hypoglycemic drugs: Secondary | ICD-10-CM | POA: Diagnosis not present

## 2022-01-02 DIAGNOSIS — Z7985 Long-term (current) use of injectable non-insulin antidiabetic drugs: Secondary | ICD-10-CM | POA: Diagnosis not present

## 2022-01-02 DIAGNOSIS — E1165 Type 2 diabetes mellitus with hyperglycemia: Secondary | ICD-10-CM | POA: Diagnosis not present

## 2022-01-02 DIAGNOSIS — Z794 Long term (current) use of insulin: Secondary | ICD-10-CM | POA: Diagnosis not present

## 2022-01-02 DIAGNOSIS — E1129 Type 2 diabetes mellitus with other diabetic kidney complication: Secondary | ICD-10-CM | POA: Diagnosis not present

## 2022-01-02 DIAGNOSIS — E113299 Type 2 diabetes mellitus with mild nonproliferative diabetic retinopathy without macular edema, unspecified eye: Secondary | ICD-10-CM | POA: Diagnosis not present

## 2022-01-11 DIAGNOSIS — N529 Male erectile dysfunction, unspecified: Secondary | ICD-10-CM | POA: Diagnosis not present

## 2022-01-27 DIAGNOSIS — E118 Type 2 diabetes mellitus with unspecified complications: Secondary | ICD-10-CM | POA: Diagnosis not present

## 2022-01-27 DIAGNOSIS — J4530 Mild persistent asthma, uncomplicated: Secondary | ICD-10-CM | POA: Diagnosis not present

## 2022-01-27 DIAGNOSIS — U071 COVID-19: Secondary | ICD-10-CM | POA: Diagnosis not present

## 2022-01-27 DIAGNOSIS — R051 Acute cough: Secondary | ICD-10-CM | POA: Diagnosis not present

## 2022-02-01 DIAGNOSIS — L2089 Other atopic dermatitis: Secondary | ICD-10-CM | POA: Diagnosis not present

## 2022-03-08 DIAGNOSIS — E119 Type 2 diabetes mellitus without complications: Secondary | ICD-10-CM | POA: Diagnosis not present

## 2022-03-17 DIAGNOSIS — H1032 Unspecified acute conjunctivitis, left eye: Secondary | ICD-10-CM | POA: Diagnosis not present

## 2022-03-17 DIAGNOSIS — R059 Cough, unspecified: Secondary | ICD-10-CM | POA: Diagnosis not present

## 2022-03-17 DIAGNOSIS — J029 Acute pharyngitis, unspecified: Secondary | ICD-10-CM | POA: Diagnosis not present

## 2022-03-17 DIAGNOSIS — J069 Acute upper respiratory infection, unspecified: Secondary | ICD-10-CM | POA: Diagnosis not present

## 2022-05-11 DIAGNOSIS — Z3202 Encounter for pregnancy test, result negative: Secondary | ICD-10-CM | POA: Diagnosis not present

## 2022-05-11 DIAGNOSIS — N760 Acute vaginitis: Secondary | ICD-10-CM | POA: Diagnosis not present

## 2022-05-11 DIAGNOSIS — R6889 Other general symptoms and signs: Secondary | ICD-10-CM | POA: Diagnosis not present

## 2022-05-11 DIAGNOSIS — R053 Chronic cough: Secondary | ICD-10-CM | POA: Diagnosis not present

## 2022-05-14 ENCOUNTER — Other Ambulatory Visit: Payer: Self-pay

## 2022-05-14 ENCOUNTER — Emergency Department (HOSPITAL_BASED_OUTPATIENT_CLINIC_OR_DEPARTMENT_OTHER)
Admission: EM | Admit: 2022-05-14 | Discharge: 2022-05-14 | Disposition: A | Discharge status: Home / Self Care | Payer: BC Managed Care – PPO | Attending: Emergency Medicine | Admitting: Emergency Medicine

## 2022-05-14 ENCOUNTER — Encounter (HOSPITAL_BASED_OUTPATIENT_CLINIC_OR_DEPARTMENT_OTHER): Payer: Self-pay | Admitting: Emergency Medicine

## 2022-05-14 ENCOUNTER — Emergency Department (HOSPITAL_BASED_OUTPATIENT_CLINIC_OR_DEPARTMENT_OTHER): Payer: BC Managed Care – PPO

## 2022-05-14 DIAGNOSIS — Z794 Long term (current) use of insulin: Secondary | ICD-10-CM | POA: Insufficient documentation

## 2022-05-14 DIAGNOSIS — J101 Influenza due to other identified influenza virus with other respiratory manifestations: Secondary | ICD-10-CM | POA: Insufficient documentation

## 2022-05-14 DIAGNOSIS — R0602 Shortness of breath: Secondary | ICD-10-CM | POA: Diagnosis not present

## 2022-05-14 DIAGNOSIS — Z1152 Encounter for screening for COVID-19: Secondary | ICD-10-CM | POA: Diagnosis not present

## 2022-05-14 DIAGNOSIS — R059 Cough, unspecified: Secondary | ICD-10-CM | POA: Diagnosis not present

## 2022-05-14 LAB — RESP PANEL BY RT-PCR (RSV, FLU A&B, COVID)  RVPGX2
Influenza A by PCR: NEGATIVE
Influenza B by PCR: POSITIVE — AB
Resp Syncytial Virus by PCR: NEGATIVE
SARS Coronavirus 2 by RT PCR: NEGATIVE

## 2022-05-14 MED ORDER — ACETAMINOPHEN-CODEINE 300-30 MG PO TABS: 1.0000 | ORAL_TABLET | Freq: Four times a day (QID) | ORAL | 0 refills | Status: DC | PRN

## 2022-05-14 NOTE — Discharge Instructions (Addendum)
You are now outside the window for Tamiflu unfortunately.  Continue to push oral fluid intake with electrolyte containing fluids.  I will prescribe Tylenol with codeine for your cough.  Continue to take your home [employer]  Recommend Tylenol and ibuprofen for muscle aches and fever.  Your chest x-ray showed no evidence of a bacterial pneumonia.

## 2022-05-14 NOTE — ED Provider Notes (Signed)
[facility] EMERGENCY DEPARTMENT Provider Note   CSN: [id number] Arrival date & time: 05/14/22  1129     History {Add pertinent medical, surgical, social history, OB history to HPI:1} Chief Complaint  Patient presents with   Cough    Mark Oconnor is a 42 y.o. male.   Cough      Home Medications Prior to Admission medications   Medication Sig Start Date End Date Taking? Authorizing Provider  albuterol (PROVENTIL HFA;VENTOLIN HFA) 108 (90 BASE) MCG/ACT inhaler Inhale 2 puffs into the lungs every 6 (six) hours as needed for wheezing or shortness of breath. Patient not taking: Reported on 08/01/2021 12/22/11 09/10/18  Etta Grandchild, MD  atorvastatin (LIPITOR) 40 MG tablet Take 40 mg by mouth daily.    [provider]  B-D UF III MINI PEN NEEDLES 31G X 5 MM MISC USE DAILY AS DIRECTED DX CODE E11.8 03/09/17   Romero Belling, MD  canagliflozin (INVOKANA) 300 MG TABS tablet Take 1 tablet (300 mg total) by mouth daily. Patient not taking: Reported on 08/01/2021 04/18/19   Romero Belling, MD  Cholecalciferol (VITAMIN D3) 1.25 MG (50000 UT) CAPS TAKE 1 CAPSULE BY MOUTH ONE TIME PER WEEK 05/12/19   Etta Grandchild, MD  Empagliflozin (JARDIANCE PO) Take by mouth.    [provider]  glucose blood (ONETOUCH ULTRA) test strip 1 each by Other route 2 (two) times a day. Use to monitor glucose levels BID 11/18/18   Romero Belling, MD  Icosapent Ethyl (VASCEPA) 1 g CAPS Take 2 capsules (2 g total) by mouth 2 (two) times daily. Patient not taking: Reported on 08/01/2021 03/14/19   Etta Grandchild, MD  insulin degludec (TRESIBA FLEXTOUCH) 200 UNIT/ML FlexTouch Pen Inject 76 Units into the skin daily. 10/21/19   Romero Belling, MD  metoprolol tartrate (LOPRESSOR) 25 MG tablet Take 25 mg by mouth 2 (two) times daily.    [provider]  Olmesartan-amLODIPine-HCTZ 40-5-12.5 MG TABS Take 1 tablet by mouth daily. 04/19/19   Etta Grandchild, MD  OneTouch Delica Lancets 30G MISC  1 each by Does not apply route 2 (two) times a day. Use to monitor glucose levels BID 11/18/18   Romero Belling, MD  Semaglutide, 1 MG/DOSE, (OZEMPIC, 1 MG/DOSE,) 2 MG/1.5ML SOPN Inject 1 mg into the skin once a week. Patient not taking: Reported on 08/01/2021 11/18/18   Romero Belling, MD  Semaglutide, 2 MG/DOSE, (OZEMPIC, 2 MG/DOSE,) 8 MG/3ML SOPN Inject 2 mg into the skin every 7 (seven) days. 04/15/21     VIAGRA 100 MG tablet TAKE 1/2-1 BY MOUTH DAILY AS NEEDED FOR ERECTILE DYSFUNCTION 09/10/19   Etta Grandchild, MD  zinc gluconate 50 MG tablet Take 1 tablet (50 mg total) by mouth daily. Patient not taking: Reported on 08/01/2021 06/24/17   Etta Grandchild, MD      Allergies    Metformin and related and Other    Review of Systems   Review of Systems  Respiratory:  Positive for cough.     Physical Exam Updated Vital Signs BP 103/74 (BP Location: Right Arm)   Pulse 86   Temp 98.4 F (36.9 C) (Oral)   Resp 16   Wt 120.2 kg   SpO2 97%   BMI 32.26 kg/m  Physical Exam  ED Results / Procedures / Treatments   Labs (all labs ordered are listed, but only abnormal results are displayed) Labs Reviewed  RESP PANEL BY RT-PCR (RSV, FLU A&B, COVID)  RVPGX2 -  Abnormal; Notable for the following components:      Result Value   Influenza B by PCR POSITIVE (*)    All other components within normal limits    EKG None  Radiology DG Chest 2 View  Result Date: 05/14/2022 CLINICAL DATA:  Shortness of breath.  Cough. EXAM: CHEST - 2 VIEW COMPARISON:  June 27, 2012 FINDINGS: The heart size and mediastinal contours are within normal limits. Both lungs are clear. The visualized skeletal structures are unremarkable. IMPRESSION: No active cardiopulmonary disease. Electronically Signed   By: Dorise Bullion III M.D.   On: 05/14/2022 12:51    Procedures Procedures  {Document cardiac monitor, telemetry assessment procedure when appropriate:1}  Medications Ordered in ED Medications - No data to  display  ED Course/ Medical Decision Making/ A&P                           Medical Decision Making Amount and/or Complexity of Data Reviewed Radiology: ordered.   ***  {Document critical care time when appropriate:1} {Document review of labs and clinical decision tools ie heart score, Chads2Vasc2 etc:1}  {Document your independent review of radiology images, and any outside records:1} {Document your discussion with family members, caretakers, and with consultants:1} {Document social determinants of health affecting pt's care:1} {Document your decision making why or why not admission, treatments were needed:1} Final Clinical Impression(s) / ED Diagnoses Final diagnoses:  None    Rx / DC Orders ED Discharge Orders     None

## 2022-05-14 NOTE — ED Triage Notes (Signed)
Pt arrives pov, steady gait, c/o sore throat, congestion with cough x 1 week, exertional shob today Tx at [facility] today

## 2022-05-15 ENCOUNTER — Telehealth (HOSPITAL_BASED_OUTPATIENT_CLINIC_OR_DEPARTMENT_OTHER): Payer: Self-pay | Admitting: Emergency Medicine

## 2022-05-15 MED ORDER — ACETAMINOPHEN-CODEINE 300-30 MG PO TABS: 1.0000 | ORAL_TABLET | Freq: Four times a day (QID) | ORAL | 0 refills | Status: AC | PRN

## 2022-05-15 NOTE — Telephone Encounter (Signed)
Notified that the patient's pharmacy does not take Tylenol 3.  Rx changed to [facility].

## 2022-05-20 DIAGNOSIS — J019 Acute sinusitis, unspecified: Secondary | ICD-10-CM | POA: Diagnosis not present

## 2022-05-25 DIAGNOSIS — E113393 Type 2 diabetes mellitus with moderate nonproliferative diabetic retinopathy without macular edema, bilateral: Secondary | ICD-10-CM | POA: Diagnosis not present

## 2022-05-30 DIAGNOSIS — E1129 Type 2 diabetes mellitus with other diabetic kidney complication: Secondary | ICD-10-CM | POA: Diagnosis not present

## 2022-05-30 DIAGNOSIS — R809 Proteinuria, unspecified: Secondary | ICD-10-CM | POA: Diagnosis not present

## 2022-05-30 DIAGNOSIS — Z794 Long term (current) use of insulin: Secondary | ICD-10-CM | POA: Diagnosis not present

## 2022-05-30 DIAGNOSIS — E113392 Type 2 diabetes mellitus with moderate nonproliferative diabetic retinopathy without macular edema, left eye: Secondary | ICD-10-CM | POA: Diagnosis not present

## 2022-05-30 DIAGNOSIS — E1165 Type 2 diabetes mellitus with hyperglycemia: Secondary | ICD-10-CM | POA: Diagnosis not present

## 2022-05-30 DIAGNOSIS — Z7985 Long-term (current) use of injectable non-insulin antidiabetic drugs: Secondary | ICD-10-CM | POA: Diagnosis not present

## 2022-05-30 DIAGNOSIS — Z7984 Long term (current) use of oral hypoglycemic drugs: Secondary | ICD-10-CM | POA: Diagnosis not present

## 2022-05-31 DIAGNOSIS — Z794 Long term (current) use of insulin: Secondary | ICD-10-CM | POA: Diagnosis not present

## 2022-05-31 DIAGNOSIS — E1165 Type 2 diabetes mellitus with hyperglycemia: Secondary | ICD-10-CM | POA: Diagnosis not present

## 2022-08-01 DIAGNOSIS — E113293 Type 2 diabetes mellitus with mild nonproliferative diabetic retinopathy without macular edema, bilateral: Secondary | ICD-10-CM | POA: Insufficient documentation

## 2022-08-01 DIAGNOSIS — Z794 Long term (current) use of insulin: Secondary | ICD-10-CM | POA: Diagnosis not present

## 2022-08-01 DIAGNOSIS — E1136 Type 2 diabetes mellitus with diabetic cataract: Secondary | ICD-10-CM | POA: Diagnosis not present

## 2022-08-01 DIAGNOSIS — H2513 Age-related nuclear cataract, bilateral: Secondary | ICD-10-CM | POA: Insufficient documentation

## 2022-08-01 DIAGNOSIS — H3581 Retinal edema: Secondary | ICD-10-CM | POA: Insufficient documentation

## 2022-09-01 DIAGNOSIS — L209 Atopic dermatitis, unspecified: Secondary | ICD-10-CM | POA: Diagnosis not present

## 2022-09-01 DIAGNOSIS — L2084 Intrinsic (allergic) eczema: Secondary | ICD-10-CM | POA: Diagnosis not present

## 2022-09-01 DIAGNOSIS — L259 Unspecified contact dermatitis, unspecified cause: Secondary | ICD-10-CM | POA: Diagnosis not present

## 2023-01-07 DIAGNOSIS — J340 Abscess, furuncle and carbuncle of nose: Secondary | ICD-10-CM | POA: Diagnosis not present

## 2023-01-07 DIAGNOSIS — L03211 Cellulitis of face: Secondary | ICD-10-CM | POA: Diagnosis not present

## 2023-01-07 DIAGNOSIS — L0390 Cellulitis, unspecified: Secondary | ICD-10-CM | POA: Diagnosis not present

## 2023-06-04 DIAGNOSIS — E1165 Type 2 diabetes mellitus with hyperglycemia: Secondary | ICD-10-CM | POA: Diagnosis not present

## 2023-06-04 DIAGNOSIS — I10 Essential (primary) hypertension: Secondary | ICD-10-CM | POA: Diagnosis not present

## 2023-06-04 DIAGNOSIS — E782 Mixed hyperlipidemia: Secondary | ICD-10-CM | POA: Diagnosis not present

## 2023-06-04 DIAGNOSIS — R058 Other specified cough: Secondary | ICD-10-CM | POA: Diagnosis not present

## 2023-06-13 DIAGNOSIS — G629 Polyneuropathy, unspecified: Secondary | ICD-10-CM | POA: Diagnosis not present

## 2023-06-13 DIAGNOSIS — I10 Essential (primary) hypertension: Secondary | ICD-10-CM | POA: Diagnosis not present

## 2023-06-13 DIAGNOSIS — E1165 Type 2 diabetes mellitus with hyperglycemia: Secondary | ICD-10-CM | POA: Diagnosis not present

## 2023-06-21 ENCOUNTER — Encounter: Payer: Self-pay | Admitting: Podiatry

## 2023-06-21 ENCOUNTER — Ambulatory Visit: Payer: BC Managed Care – PPO | Admitting: Podiatry

## 2023-06-21 DIAGNOSIS — E118 Type 2 diabetes mellitus with unspecified complications: Secondary | ICD-10-CM | POA: Diagnosis not present

## 2023-06-21 DIAGNOSIS — Z794 Long term (current) use of insulin: Secondary | ICD-10-CM | POA: Diagnosis not present

## 2023-06-21 DIAGNOSIS — B351 Tinea unguium: Secondary | ICD-10-CM

## 2023-06-21 DIAGNOSIS — M79675 Pain in left toe(s): Secondary | ICD-10-CM | POA: Diagnosis not present

## 2023-06-21 DIAGNOSIS — M79674 Pain in right toe(s): Secondary | ICD-10-CM

## 2023-06-21 NOTE — Progress Notes (Signed)
  Subjective:  Patient ID: Mark Oconnor, male    DOB: 15-Sep-1980,   MRN: [id number]  No chief complaint on file.   43 y.o. male presents for concern of thickened elongated and painful nails that are difficult to trim. Requesting to have them trimmed today. Also relates some callus on the inside of his right great toe.  Relates burning and tingling in their feet. Patient is diabetic and last A1c was  Lab Results  Component Value Date   HGBA1C 13.2 (A) 10/08/2019   .   PCP:  Iona Hansen, NP    . Denies any other pedal complaints. Denies n/v/f/c.   Past Medical History:  Diagnosis Date   Allergy    Diabetes mellitus    GERD (gastroesophageal reflux disease)    Hyperlipidemia    Hypertension    Morbid obesity (HCC)    Numbness and tingling of both feet    Sleep apnea 05-21-13   denies.Stop/Bang score =4 with PAT visit    Objective:  Physical Exam: Vascular: DP/PT pulses 2/4 bilateral. CFT <3 seconds. Absent hair growth on digits. Edema noted to bilateral lower extremities. Xerosis noted bilaterally.  Skin. No lacerations or abrasions bilateral feet. Nails 1-5 bilateral  are thickened discolored and elongated with subungual debris. Hyperkeratotic lesion noted to medial hallux on right foot.  Musculoskeletal: MMT 5/5 bilateral lower extremities in DF, PF, Inversion and Eversion. Deceased ROM in DF of ankle joint.  Neurological: Sensation intact to light touch. Protective sensation diminished bilateral.    Assessment:   1. Type 2 diabetes mellitus with complication, with long-term current use of insulin (HCC)   2. Pain due to onychomycosis of toenails of both feet      Plan:  Patient was evaluated and treated and all questions answered. -Discussed and educated patient on diabetic foot care, especially with  regards to the vascular, neurological and musculoskeletal systems.  -Stressed the importance of good glycemic control and the detriment of not  controlling glucose  levels in relation to the foot. -Discussed supportive shoes at all times and checking feet regularly.  -Mechanically debrided all nails 1-5 bilateral using sterile nail nipper and filed with dremel without incident  -Hyperkeratotic lesion noted to medial right hallux debrided as courtesy today.  -Answered all patient questions -Patient to return  in 3 months for at risk foot care -Patient advised to call the office if any problems or questions arise in the meantime.   Louann Sjogren, DPM

## 2023-06-25 DIAGNOSIS — I10 Essential (primary) hypertension: Secondary | ICD-10-CM | POA: Diagnosis not present

## 2023-06-25 DIAGNOSIS — R058 Other specified cough: Secondary | ICD-10-CM | POA: Diagnosis not present

## 2023-06-25 DIAGNOSIS — E1165 Type 2 diabetes mellitus with hyperglycemia: Secondary | ICD-10-CM | POA: Diagnosis not present

## 2023-06-25 DIAGNOSIS — E782 Mixed hyperlipidemia: Secondary | ICD-10-CM | POA: Diagnosis not present

## 2023-07-02 DIAGNOSIS — E1165 Type 2 diabetes mellitus with hyperglycemia: Secondary | ICD-10-CM | POA: Diagnosis not present

## 2023-07-02 DIAGNOSIS — E782 Mixed hyperlipidemia: Secondary | ICD-10-CM | POA: Diagnosis not present

## 2023-07-02 DIAGNOSIS — G629 Polyneuropathy, unspecified: Secondary | ICD-10-CM | POA: Diagnosis not present

## 2023-07-02 DIAGNOSIS — I10 Essential (primary) hypertension: Secondary | ICD-10-CM | POA: Diagnosis not present

## 2023-07-25 DIAGNOSIS — E1165 Type 2 diabetes mellitus with hyperglycemia: Secondary | ICD-10-CM | POA: Diagnosis not present

## 2023-07-25 DIAGNOSIS — E782 Mixed hyperlipidemia: Secondary | ICD-10-CM | POA: Diagnosis not present

## 2023-07-25 DIAGNOSIS — I10 Essential (primary) hypertension: Secondary | ICD-10-CM | POA: Diagnosis not present

## 2023-07-25 DIAGNOSIS — N182 Chronic kidney disease, stage 2 (mild): Secondary | ICD-10-CM | POA: Diagnosis not present

## 2023-08-03 DIAGNOSIS — R319 Hematuria, unspecified: Secondary | ICD-10-CM | POA: Diagnosis not present

## 2023-08-03 DIAGNOSIS — R361 Hematospermia: Secondary | ICD-10-CM | POA: Diagnosis not present

## 2023-08-03 DIAGNOSIS — J029 Acute pharyngitis, unspecified: Secondary | ICD-10-CM | POA: Diagnosis not present

## 2023-08-03 DIAGNOSIS — I10 Essential (primary) hypertension: Secondary | ICD-10-CM | POA: Diagnosis not present

## 2023-08-03 DIAGNOSIS — Z20822 Contact with and (suspected) exposure to covid-19: Secondary | ICD-10-CM | POA: Diagnosis not present

## 2023-09-04 DIAGNOSIS — E113213 Type 2 diabetes mellitus with mild nonproliferative diabetic retinopathy with macular edema, bilateral: Secondary | ICD-10-CM | POA: Diagnosis not present

## 2023-09-04 DIAGNOSIS — H2513 Age-related nuclear cataract, bilateral: Secondary | ICD-10-CM | POA: Diagnosis not present

## 2023-09-04 DIAGNOSIS — H3581 Retinal edema: Secondary | ICD-10-CM | POA: Diagnosis not present

## 2023-09-04 DIAGNOSIS — E1136 Type 2 diabetes mellitus with diabetic cataract: Secondary | ICD-10-CM | POA: Diagnosis not present

## 2023-09-05 DIAGNOSIS — I10 Essential (primary) hypertension: Secondary | ICD-10-CM | POA: Diagnosis not present

## 2023-09-05 DIAGNOSIS — E782 Mixed hyperlipidemia: Secondary | ICD-10-CM | POA: Diagnosis not present

## 2023-09-05 DIAGNOSIS — N182 Chronic kidney disease, stage 2 (mild): Secondary | ICD-10-CM | POA: Diagnosis not present

## 2023-09-05 DIAGNOSIS — E1165 Type 2 diabetes mellitus with hyperglycemia: Secondary | ICD-10-CM | POA: Diagnosis not present

## 2023-09-17 ENCOUNTER — Ambulatory Visit (INDEPENDENT_AMBULATORY_CARE_PROVIDER_SITE_OTHER): Payer: BC Managed Care – PPO | Admitting: Podiatry

## 2023-09-17 DIAGNOSIS — E118 Type 2 diabetes mellitus with unspecified complications: Secondary | ICD-10-CM

## 2023-09-17 DIAGNOSIS — Z794 Long term (current) use of insulin: Secondary | ICD-10-CM

## 2023-09-17 DIAGNOSIS — B351 Tinea unguium: Secondary | ICD-10-CM

## 2023-09-17 DIAGNOSIS — M79674 Pain in right toe(s): Secondary | ICD-10-CM | POA: Diagnosis not present

## 2023-09-17 DIAGNOSIS — M79675 Pain in left toe(s): Secondary | ICD-10-CM

## 2023-09-17 NOTE — Progress Notes (Signed)
  Subjective:  Patient ID: Mark Oconnor, male    DOB: 08-Jan-1981,   MRN: [id number]  No chief complaint on file.   43 y.o. male presents for concern of thickened elongated and painful nails that are difficult to trim. Requesting to have them trimmed today. Also relates some callus on the inside of his right great toe.  Relates burning and tingling in their feet. Patient is diabetic and last A1c was  Lab Results  Component Value Date   HGBA1C 13.2 (A) 10/08/2019   .   PCP:  Angelia Kelp, NP    . Denies any other pedal complaints. Denies n/v/f/c.   Past Medical History:  Diagnosis Date   Allergy     Diabetes mellitus    GERD (gastroesophageal reflux disease)    Hyperlipidemia    Hypertension    Morbid obesity (HCC)    Numbness and tingling of both feet    Sleep apnea 05-21-13   denies.Stop/Bang score =4 with PAT visit    Objective:  Physical Exam: Vascular: DP/PT pulses 2/4 bilateral. CFT <3 seconds. Absent hair growth on digits. Edema noted to bilateral lower extremities. Xerosis noted bilaterally.  Skin. No lacerations or abrasions bilateral feet. Nails 1-5 bilateral  are thickened discolored and elongated with subungual debris. Hyperkeratotic lesion noted to medial hallux on right foot.  Musculoskeletal: MMT 5/5 bilateral lower extremities in DF, PF, Inversion and Eversion. Deceased ROM in DF of ankle joint.  Neurological: Sensation intact to light touch. Protective sensation diminished bilateral.    Assessment:   1. Pain due to onychomycosis of toenails of both feet   2. Type 2 diabetes mellitus with complication, with long-term current use of insulin  (HCC)       Plan:  Patient was evaluated and treated and all questions answered. -Discussed and educated patient on diabetic foot care, especially with  regards to the vascular, neurological and musculoskeletal systems.  -Stressed the importance of good glycemic control and the detriment of not  controlling glucose  levels in relation to the foot. -Discussed supportive shoes at all times and checking feet regularly.  -Mechanically debrided all nails 1-5 bilateral using sterile nail nipper and filed with dremel without incident  -Hyperkeratotic lesion noted to medial right hallux debrided as courtesy today.  -Answered all patient questions -Patient to return  in 3 months for at risk foot care -Patient advised to call the office if any problems or questions arise in the meantime.   Jennefer Moats, DPM

## 2023-10-17 DIAGNOSIS — F1090 Alcohol use, unspecified, uncomplicated: Secondary | ICD-10-CM | POA: Diagnosis not present

## 2023-10-17 DIAGNOSIS — E782 Mixed hyperlipidemia: Secondary | ICD-10-CM | POA: Diagnosis not present

## 2023-10-17 DIAGNOSIS — E1165 Type 2 diabetes mellitus with hyperglycemia: Secondary | ICD-10-CM | POA: Diagnosis not present

## 2023-10-17 DIAGNOSIS — I10 Essential (primary) hypertension: Secondary | ICD-10-CM | POA: Diagnosis not present

## 2023-11-26 DIAGNOSIS — E1165 Type 2 diabetes mellitus with hyperglycemia: Secondary | ICD-10-CM | POA: Diagnosis not present

## 2023-11-26 DIAGNOSIS — Z0000 Encounter for general adult medical examination without abnormal findings: Secondary | ICD-10-CM | POA: Diagnosis not present

## 2023-11-26 DIAGNOSIS — E782 Mixed hyperlipidemia: Secondary | ICD-10-CM | POA: Diagnosis not present

## 2023-11-26 DIAGNOSIS — I10 Essential (primary) hypertension: Secondary | ICD-10-CM | POA: Diagnosis not present

## 2023-12-03 DIAGNOSIS — E782 Mixed hyperlipidemia: Secondary | ICD-10-CM | POA: Diagnosis not present

## 2023-12-03 DIAGNOSIS — I10 Essential (primary) hypertension: Secondary | ICD-10-CM | POA: Diagnosis not present

## 2023-12-03 DIAGNOSIS — Z23 Encounter for immunization: Secondary | ICD-10-CM | POA: Diagnosis not present

## 2023-12-03 DIAGNOSIS — Z0000 Encounter for general adult medical examination without abnormal findings: Secondary | ICD-10-CM | POA: Diagnosis not present

## 2023-12-03 DIAGNOSIS — E1165 Type 2 diabetes mellitus with hyperglycemia: Secondary | ICD-10-CM | POA: Diagnosis not present

## 2023-12-11 DIAGNOSIS — E113213 Type 2 diabetes mellitus with mild nonproliferative diabetic retinopathy with macular edema, bilateral: Secondary | ICD-10-CM | POA: Diagnosis not present

## 2023-12-11 DIAGNOSIS — H35353 Cystoid macular degeneration, bilateral: Secondary | ICD-10-CM | POA: Diagnosis not present

## 2023-12-17 DIAGNOSIS — E1165 Type 2 diabetes mellitus with hyperglycemia: Secondary | ICD-10-CM | POA: Diagnosis not present

## 2023-12-17 DIAGNOSIS — N182 Chronic kidney disease, stage 2 (mild): Secondary | ICD-10-CM | POA: Diagnosis not present

## 2023-12-17 DIAGNOSIS — I10 Essential (primary) hypertension: Secondary | ICD-10-CM | POA: Diagnosis not present

## 2023-12-17 DIAGNOSIS — E782 Mixed hyperlipidemia: Secondary | ICD-10-CM | POA: Diagnosis not present

## 2023-12-19 ENCOUNTER — Encounter: Payer: Self-pay | Admitting: Podiatry

## 2023-12-19 ENCOUNTER — Ambulatory Visit: Admitting: Podiatry

## 2023-12-19 DIAGNOSIS — B351 Tinea unguium: Secondary | ICD-10-CM

## 2023-12-19 DIAGNOSIS — E118 Type 2 diabetes mellitus with unspecified complications: Secondary | ICD-10-CM

## 2023-12-19 DIAGNOSIS — Z794 Long term (current) use of insulin: Secondary | ICD-10-CM

## 2023-12-19 DIAGNOSIS — M79674 Pain in right toe(s): Secondary | ICD-10-CM | POA: Diagnosis not present

## 2023-12-19 DIAGNOSIS — M79675 Pain in left toe(s): Secondary | ICD-10-CM

## 2023-12-19 NOTE — Progress Notes (Signed)
  Subjective:  Patient ID: Mark Oconnor, male    DOB: Jan 21, 1981,   MRN: [id number]  Chief Complaint  Patient presents with   Debridement    Trim toenails    43 y.o. male presents for concern of thickened elongated and painful nails that are difficult to trim. Requesting to have them trimmed today. Also relates some callus on the inside of his right great toe.  Relates burning and tingling in their feet. Patient is diabetic and last A1c was  Lab Results  Component Value Date   HGBA1C 13.2 (A) 10/08/2019   .   PCP:  Verdia Lombard, MD    . Denies any other pedal complaints. Denies n/v/f/c.   Past Medical History:  Diagnosis Date   Allergy     Diabetes mellitus    GERD (gastroesophageal reflux disease)    Hyperlipidemia    Hypertension    Morbid obesity (HCC)    Numbness and tingling of both feet    Sleep apnea 05-21-13   denies.Stop/Bang score =4 with PAT visit    Objective:  Physical Exam: Vascular: DP/PT pulses 2/4 bilateral. CFT <3 seconds. Absent hair growth on digits. Edema noted to bilateral lower extremities. Xerosis noted bilaterally.  Skin. No lacerations or abrasions bilateral feet. Nails 1-5 bilateral  are thickened discolored and elongated with subungual debris. Hyperkeratotic lesion noted to medial hallux on right foot.  Musculoskeletal: MMT 5/5 bilateral lower extremities in DF, PF, Inversion and Eversion. Deceased ROM in DF of ankle joint.  Neurological: Sensation intact to light touch. Protective sensation diminished bilateral.    Assessment:   1. Pain due to onychomycosis of toenails of both feet   2. Type 2 diabetes mellitus with complication, with long-term current use of insulin  (HCC)       Plan:  Patient was evaluated and treated and all questions answered. -Discussed and educated patient on diabetic foot care, especially with  regards to the vascular, neurological and musculoskeletal systems.  -Stressed the importance of good glycemic  control and the detriment of not  controlling glucose levels in relation to the foot. -Discussed supportive shoes at all times and checking feet regularly.  -Mechanically debrided all nails 1-5 bilateral using sterile nail nipper and filed with dremel without incident  -Hyperkeratotic lesion noted to medial right hallux debrided as courtesy today.  -Answered all patient questions -Patient to return  in 3 months for at risk foot care -Patient advised to call the office if any problems or questions arise in the meantime.   Asberry Failing, DPM

## 2024-01-22 DIAGNOSIS — H35353 Cystoid macular degeneration, bilateral: Secondary | ICD-10-CM | POA: Diagnosis not present

## 2024-01-22 DIAGNOSIS — E113213 Type 2 diabetes mellitus with mild nonproliferative diabetic retinopathy with macular edema, bilateral: Secondary | ICD-10-CM | POA: Diagnosis not present

## 2024-01-23 DIAGNOSIS — B349 Viral infection, unspecified: Secondary | ICD-10-CM | POA: Diagnosis not present

## 2024-01-23 DIAGNOSIS — Z20822 Contact with and (suspected) exposure to covid-19: Secondary | ICD-10-CM | POA: Diagnosis not present

## 2024-02-06 DIAGNOSIS — L818 Other specified disorders of pigmentation: Secondary | ICD-10-CM | POA: Diagnosis not present

## 2024-02-06 DIAGNOSIS — D2262 Melanocytic nevi of left upper limb, including shoulder: Secondary | ICD-10-CM | POA: Diagnosis not present

## 2024-03-07 ENCOUNTER — Telehealth: Payer: Self-pay | Admitting: Podiatry

## 2024-03-07 NOTE — Telephone Encounter (Signed)
 I called this patient to reschedule his rfc with Dr.Mayer .He states that he doesn't want to see a male doctor and Dr.Galaway is scheduling in February. He states he will go to the salon where he used to if he can't see a male doctor. Would you continue to see him for his routine foot care?

## 2024-03-11 NOTE — Telephone Encounter (Signed)
 He is now scheduled 03/31/24.

## 2024-03-18 DIAGNOSIS — E1165 Type 2 diabetes mellitus with hyperglycemia: Secondary | ICD-10-CM | POA: Diagnosis not present

## 2024-03-20 ENCOUNTER — Ambulatory Visit: Admitting: Podiatry

## 2024-03-25 DIAGNOSIS — H35353 Cystoid macular degeneration, bilateral: Secondary | ICD-10-CM | POA: Diagnosis not present

## 2024-03-25 DIAGNOSIS — E113213 Type 2 diabetes mellitus with mild nonproliferative diabetic retinopathy with macular edema, bilateral: Secondary | ICD-10-CM | POA: Diagnosis not present

## 2024-03-31 ENCOUNTER — Ambulatory Visit (INDEPENDENT_AMBULATORY_CARE_PROVIDER_SITE_OTHER): Payer: Self-pay | Admitting: Podiatry

## 2024-03-31 DIAGNOSIS — Z91199 Patient's noncompliance with other medical treatment and regimen due to unspecified reason: Secondary | ICD-10-CM

## 2024-03-31 NOTE — Progress Notes (Signed)
 No show
